# Patient Record
Sex: Female | Born: 1960
Health system: Southern US, Community
[De-identification: ages and names within clinical notes are randomized; demographics above are authoritative.]

## PROBLEM LIST (undated history)

## (undated) DIAGNOSIS — I10 Essential (primary) hypertension: Secondary | ICD-10-CM

## (undated) DIAGNOSIS — E785 Hyperlipidemia, unspecified: Secondary | ICD-10-CM

## (undated) DIAGNOSIS — Z860100 Personal history of colon polyps, unspecified: Secondary | ICD-10-CM

## (undated) DIAGNOSIS — F419 Anxiety disorder, unspecified: Secondary | ICD-10-CM

## (undated) DIAGNOSIS — G5602 Carpal tunnel syndrome, left upper limb: Secondary | ICD-10-CM

## (undated) DIAGNOSIS — R519 Headache, unspecified: Secondary | ICD-10-CM

## (undated) DIAGNOSIS — F32A Depression, unspecified: Secondary | ICD-10-CM

## (undated) DIAGNOSIS — Z8614 Personal history of Methicillin resistant Staphylococcus aureus infection: Secondary | ICD-10-CM

## (undated) DIAGNOSIS — M1990 Unspecified osteoarthritis, unspecified site: Secondary | ICD-10-CM

## (undated) DIAGNOSIS — F329 Major depressive disorder, single episode, unspecified: Secondary | ICD-10-CM

## (undated) HISTORY — DX: Hyperlipidemia, unspecified: E78.5

## (undated) HISTORY — DX: Essential (primary) hypertension: I10

## (undated) HISTORY — DX: Unspecified osteoarthritis, unspecified site: M19.90

## (undated) HISTORY — DX: Depression, unspecified: F32.A

## (undated) HISTORY — PX: BREAST EXCISIONAL BIOPSY: SUR124

## (undated) HISTORY — PX: ABDOMINAL HYSTERECTOMY: SHX81

## (undated) HISTORY — DX: Major depressive disorder, single episode, unspecified: F32.9

## (undated) HISTORY — DX: Headache, unspecified: R51.9

## (undated) HISTORY — DX: Personal history of colon polyps, unspecified: Z86.0100

## (undated) HISTORY — DX: Anxiety disorder, unspecified: F41.9

---

## 1997-09-19 ENCOUNTER — Ambulatory Visit (HOSPITAL_COMMUNITY): Admission: RE | Admit: 1997-09-19 | Discharge: 1997-09-19 | Payer: Self-pay | Admitting: *Deleted

## 1998-08-05 ENCOUNTER — Emergency Department (HOSPITAL_COMMUNITY): Admission: EM | Admit: 1998-08-05 | Discharge: 1998-08-05 | Payer: Self-pay | Admitting: Emergency Medicine

## 1998-10-26 ENCOUNTER — Emergency Department (HOSPITAL_COMMUNITY): Admission: EM | Admit: 1998-10-26 | Discharge: 1998-10-26 | Payer: Self-pay | Admitting: Emergency Medicine

## 1998-12-01 ENCOUNTER — Other Ambulatory Visit: Admission: RE | Admit: 1998-12-01 | Discharge: 1998-12-01 | Payer: Self-pay | Admitting: *Deleted

## 1999-10-29 ENCOUNTER — Ambulatory Visit (HOSPITAL_COMMUNITY): Admission: RE | Admit: 1999-10-29 | Discharge: 1999-10-29 | Payer: Self-pay | Admitting: *Deleted

## 2000-01-20 ENCOUNTER — Other Ambulatory Visit: Admission: RE | Admit: 2000-01-20 | Discharge: 2000-01-20 | Payer: Self-pay | Admitting: *Deleted

## 2000-11-01 ENCOUNTER — Ambulatory Visit (HOSPITAL_COMMUNITY): Admission: RE | Admit: 2000-11-01 | Discharge: 2000-11-01 | Payer: Self-pay | Admitting: Internal Medicine

## 2000-11-01 ENCOUNTER — Encounter: Payer: Self-pay | Admitting: Internal Medicine

## 2001-02-23 ENCOUNTER — Emergency Department (HOSPITAL_COMMUNITY): Admission: EM | Admit: 2001-02-23 | Discharge: 2001-02-23 | Payer: Self-pay | Admitting: Emergency Medicine

## 2001-07-04 ENCOUNTER — Encounter: Payer: Self-pay | Admitting: Emergency Medicine

## 2001-07-04 ENCOUNTER — Emergency Department (HOSPITAL_COMMUNITY): Admission: EM | Admit: 2001-07-04 | Discharge: 2001-07-04 | Payer: Self-pay | Admitting: Emergency Medicine

## 2001-12-04 ENCOUNTER — Ambulatory Visit (HOSPITAL_COMMUNITY): Admission: RE | Admit: 2001-12-04 | Discharge: 2001-12-04 | Payer: Self-pay | Admitting: Internal Medicine

## 2001-12-04 ENCOUNTER — Encounter: Payer: Self-pay | Admitting: Internal Medicine

## 2002-05-07 ENCOUNTER — Ambulatory Visit (HOSPITAL_COMMUNITY): Admission: RE | Admit: 2002-05-07 | Discharge: 2002-05-07 | Payer: Self-pay | Admitting: Internal Medicine

## 2002-05-07 ENCOUNTER — Encounter: Payer: Self-pay | Admitting: Internal Medicine

## 2002-05-10 ENCOUNTER — Encounter: Payer: Self-pay | Admitting: Internal Medicine

## 2002-05-10 ENCOUNTER — Ambulatory Visit (HOSPITAL_COMMUNITY): Admission: RE | Admit: 2002-05-10 | Discharge: 2002-05-10 | Payer: Self-pay | Admitting: Internal Medicine

## 2002-05-17 ENCOUNTER — Other Ambulatory Visit: Admission: RE | Admit: 2002-05-17 | Discharge: 2002-05-17 | Payer: Self-pay | Admitting: Obstetrics and Gynecology

## 2002-05-18 ENCOUNTER — Ambulatory Visit (HOSPITAL_COMMUNITY): Admission: RE | Admit: 2002-05-18 | Discharge: 2002-05-18 | Payer: Self-pay | Admitting: Obstetrics and Gynecology

## 2002-05-18 ENCOUNTER — Encounter (INDEPENDENT_AMBULATORY_CARE_PROVIDER_SITE_OTHER): Payer: Self-pay | Admitting: Specialist

## 2002-05-18 ENCOUNTER — Encounter (INDEPENDENT_AMBULATORY_CARE_PROVIDER_SITE_OTHER): Payer: Self-pay

## 2002-05-18 HISTORY — PX: LAPAROSCOPIC UNILATERAL SALPINGECTOMY: SHX5934

## 2002-05-18 HISTORY — PX: OVARY BIOPSY: SHX2143

## 2002-12-10 ENCOUNTER — Emergency Department (HOSPITAL_COMMUNITY): Admission: EM | Admit: 2002-12-10 | Discharge: 2002-12-10 | Payer: Self-pay | Admitting: Emergency Medicine

## 2003-09-30 ENCOUNTER — Emergency Department (HOSPITAL_COMMUNITY): Admission: EM | Admit: 2003-09-30 | Discharge: 2003-10-01 | Payer: Self-pay | Admitting: Emergency Medicine

## 2003-11-05 ENCOUNTER — Ambulatory Visit (HOSPITAL_COMMUNITY): Admission: RE | Admit: 2003-11-05 | Discharge: 2003-11-05 | Payer: Self-pay | Admitting: Internal Medicine

## 2003-11-11 ENCOUNTER — Other Ambulatory Visit: Admission: RE | Admit: 2003-11-11 | Discharge: 2003-11-11 | Payer: Self-pay | Admitting: Obstetrics and Gynecology

## 2003-11-13 ENCOUNTER — Emergency Department (HOSPITAL_COMMUNITY): Admission: EM | Admit: 2003-11-13 | Discharge: 2003-11-13 | Payer: Self-pay | Admitting: Emergency Medicine

## 2004-04-11 ENCOUNTER — Emergency Department (HOSPITAL_COMMUNITY): Admission: EM | Admit: 2004-04-11 | Discharge: 2004-04-11 | Payer: Self-pay | Admitting: Family Medicine

## 2004-04-15 ENCOUNTER — Emergency Department (HOSPITAL_COMMUNITY): Admission: EM | Admit: 2004-04-15 | Discharge: 2004-04-15 | Payer: Self-pay | Admitting: Family Medicine

## 2004-10-14 ENCOUNTER — Emergency Department (HOSPITAL_COMMUNITY): Admission: EM | Admit: 2004-10-14 | Discharge: 2004-10-14 | Payer: Self-pay | Admitting: Family Medicine

## 2004-11-30 ENCOUNTER — Emergency Department (HOSPITAL_COMMUNITY): Admission: EM | Admit: 2004-11-30 | Discharge: 2004-11-30 | Payer: Self-pay | Admitting: Family Medicine

## 2005-06-03 ENCOUNTER — Emergency Department (HOSPITAL_COMMUNITY): Admission: EM | Admit: 2005-06-03 | Discharge: 2005-06-03 | Payer: Self-pay | Admitting: Family Medicine

## 2005-12-03 ENCOUNTER — Emergency Department (HOSPITAL_COMMUNITY): Admission: EM | Admit: 2005-12-03 | Discharge: 2005-12-03 | Payer: Self-pay | Admitting: Family Medicine

## 2006-08-29 ENCOUNTER — Ambulatory Visit (HOSPITAL_COMMUNITY): Admission: RE | Admit: 2006-08-29 | Discharge: 2006-08-29 | Payer: Self-pay | Admitting: Internal Medicine

## 2006-08-31 ENCOUNTER — Ambulatory Visit (HOSPITAL_COMMUNITY): Admission: RE | Admit: 2006-08-31 | Discharge: 2006-08-31 | Payer: Self-pay | Admitting: Internal Medicine

## 2006-10-24 ENCOUNTER — Ambulatory Visit: Payer: Self-pay

## 2006-11-08 ENCOUNTER — Emergency Department (HOSPITAL_COMMUNITY): Admission: EM | Admit: 2006-11-08 | Discharge: 2006-11-08 | Payer: Self-pay | Admitting: Emergency Medicine

## 2006-12-30 ENCOUNTER — Emergency Department (HOSPITAL_COMMUNITY): Admission: EM | Admit: 2006-12-30 | Discharge: 2006-12-30 | Payer: Self-pay | Admitting: Emergency Medicine

## 2007-01-24 ENCOUNTER — Ambulatory Visit: Payer: Self-pay | Admitting: Gastroenterology

## 2007-01-24 LAB — CONVERTED CEMR LAB: Lipase: 16 units/L (ref 11.0–59.0)

## 2007-01-27 ENCOUNTER — Ambulatory Visit (HOSPITAL_COMMUNITY): Admission: RE | Admit: 2007-01-27 | Discharge: 2007-01-27 | Payer: Self-pay | Admitting: Gastroenterology

## 2007-03-01 ENCOUNTER — Ambulatory Visit: Payer: Self-pay | Admitting: Gastroenterology

## 2007-03-26 ENCOUNTER — Emergency Department (HOSPITAL_COMMUNITY): Admission: EM | Admit: 2007-03-26 | Discharge: 2007-03-26 | Payer: Self-pay | Admitting: Family Medicine

## 2007-04-25 ENCOUNTER — Ambulatory Visit: Payer: Self-pay | Admitting: Gastroenterology

## 2007-04-25 LAB — CONVERTED CEMR LAB
Eosinophils Absolute: 0.1 10*3/uL (ref 0.0–0.6)
Eosinophils Relative: 2.3 % (ref 0.0–5.0)
Hemoglobin: 14 g/dL (ref 12.0–15.0)
Monocytes Absolute: 0.5 10*3/uL (ref 0.2–0.7)
RBC: 4.33 M/uL (ref 3.87–5.11)
RDW: 13.1 % (ref 11.5–14.6)
WBC: 6.5 10*3/uL (ref 4.5–10.5)

## 2007-07-02 ENCOUNTER — Emergency Department (HOSPITAL_COMMUNITY): Admission: EM | Admit: 2007-07-02 | Discharge: 2007-07-02 | Payer: Self-pay | Admitting: Emergency Medicine

## 2007-07-11 ENCOUNTER — Emergency Department (HOSPITAL_COMMUNITY): Admission: EM | Admit: 2007-07-11 | Discharge: 2007-07-11 | Payer: Self-pay | Admitting: Family Medicine

## 2007-07-14 ENCOUNTER — Emergency Department (HOSPITAL_COMMUNITY): Admission: EM | Admit: 2007-07-14 | Discharge: 2007-07-14 | Payer: Self-pay | Admitting: Emergency Medicine

## 2007-07-16 ENCOUNTER — Emergency Department (HOSPITAL_COMMUNITY): Admission: EM | Admit: 2007-07-16 | Discharge: 2007-07-16 | Payer: Self-pay | Admitting: *Deleted

## 2007-09-28 ENCOUNTER — Ambulatory Visit (HOSPITAL_COMMUNITY): Admission: RE | Admit: 2007-09-28 | Discharge: 2007-09-28 | Payer: Self-pay | Admitting: Internal Medicine

## 2008-02-22 ENCOUNTER — Emergency Department (HOSPITAL_COMMUNITY): Admission: EM | Admit: 2008-02-22 | Discharge: 2008-02-22 | Payer: Self-pay | Admitting: Family Medicine

## 2008-03-23 ENCOUNTER — Emergency Department (HOSPITAL_COMMUNITY): Admission: EM | Admit: 2008-03-23 | Discharge: 2008-03-23 | Payer: Self-pay | Admitting: Emergency Medicine

## 2008-04-12 ENCOUNTER — Emergency Department (HOSPITAL_COMMUNITY): Admission: EM | Admit: 2008-04-12 | Discharge: 2008-04-12 | Payer: Self-pay | Admitting: Emergency Medicine

## 2008-05-31 ENCOUNTER — Emergency Department (HOSPITAL_COMMUNITY): Admission: EM | Admit: 2008-05-31 | Discharge: 2008-05-31 | Payer: Self-pay | Admitting: Family Medicine

## 2008-11-26 ENCOUNTER — Emergency Department (HOSPITAL_COMMUNITY): Admission: EM | Admit: 2008-11-26 | Discharge: 2008-11-26 | Payer: Self-pay | Admitting: Family Medicine

## 2009-03-11 ENCOUNTER — Emergency Department (HOSPITAL_COMMUNITY): Admission: EM | Admit: 2009-03-11 | Discharge: 2009-03-11 | Payer: Self-pay | Admitting: Family Medicine

## 2009-05-12 ENCOUNTER — Emergency Department (HOSPITAL_COMMUNITY): Admission: EM | Admit: 2009-05-12 | Discharge: 2009-05-12 | Payer: Self-pay | Admitting: Emergency Medicine

## 2010-01-31 ENCOUNTER — Emergency Department (HOSPITAL_COMMUNITY): Admission: EM | Admit: 2010-01-31 | Discharge: 2010-01-31 | Payer: Self-pay | Admitting: Emergency Medicine

## 2010-11-10 NOTE — Assessment & Plan Note (Signed)
[facility]                         GASTROENTEROLOGY OFFICE NOTE   Rhonda [name], Rhonda [name]                       MRN:          [id number]  DATE:01/24/2007                            DOB:          21-Sep-1960    NEW PATIENT EVALUATION:   HISTORY OF PRESENT ILLNESS:  Rhonda [name] is referred for evaluation of  acid reflux symptoms and also crampy, rather persistent right lower  quadrant pain over the last 3 or 4 months.   Rhonda [name] has been in fairly good health most of her life without  chronic GI complaints, until the last 3 or 4 months, when she has  developed rather classical burning chest pain radiating to her back with  associated spasmodic-type pain in her subxiphoid area.  She has had a  negative upper abdominal ultrasound and has responded partially to  Zegerid administration per Dr. Elisabeth Most.  She also has had almost daily  crampy right lower quadrant pain with abdominal gas and bloating, but no  fever, chills, nausea or vomiting.  She has had some increasing  constipation and has had to use MiraLax.  She has attributed some of her  constipation to new-onset use of Norvasc for hypertension.  Despite all  these complaints, she has gained 10 pounds in weight since May.  She  denies any specific food intolerance or lactose intolerance.  She has  had no hepatobiliary complaints or history of pancreatitis, hepatitis or  other known gastrointestinal problems.  She has not had endoscopic or  barium studies of her gut.  She denies abuse of NSAIDs, alcohol or  cigarette.   PAST MEDICAL HISTORY:  Remarkable for hypertension,  hypercholesterolemia, chronic depression and partial hysterectomy.  She  is under the care of a local gynecologist and has no history of chronic  GYN problems.   MEDICATIONS:  1. Maxalt 10 mg a day.  2. Zegerid 40 mg a day.  3. Micardis HCT 80/25 mg a day.  4. Norvasc 5 mg a day.  5. Zetia 10 mg a day.  6. Claritin 10 mg  p.r.n.  7. Aspirin 81 mg a day.  8. MiraLax daily.  9. Activa daily.   ALLERGIES:  She in the past has had reactions to PENICILLIN.   FAMILY HISTORY:  Noncontributory.   SOCIAL HISTORY:  She is divorced and lives with her children.  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  She does not smoke or abuse ethanol.   REVIEW OF SYSTEMS:  Remarkable for urinary frequency, lower extremity  edema, night sweats, low back pain, severe fatigue, and some shortness  of breath with exertion.  She otherwise denies any specific  cardiovascular, pulmonary, neurologic or systemic complaints.  Review of  systems is otherwise noncontributory.   PHYSICAL EXAMINATION:  She is a healthy-appearing black female appearing  her stated age.  She is 5-feet 5-inches tall and weighs 205 pounds.  Blood pressure is  116/76 and pulse was 80 and regular.  I could not appreciate stigmata of chronic liver disease or thyromegaly.  CHEST:  Clear and she appeared to be in a regular rhythm without  murmurs, gallops or rubs.  I could not appreciate hepatosplenomegaly or abdominal masses, but there  was rather marked tenderness in the right lower quadrant to mild  palpation.  Bowel sounds were somewhat diminished.  The upper extremities were unremarkable and there were negative psoas  and obturator signs in the right leg.  Inspection of the rectum unremarkable, as was rectal exam with solid  stool present that was guaiac-negative.   ASSESSMENT:  1. Acid reflux with associated esophageal spasm.  2. Right lower quadrant pain with marked tenderness certainly      suggestive of inflammatory process such as inflammatory bowel      disease with associated constipation.  3. Well-controlled essential hypertension.  4. History of hypercholesterolemia.  5. History of chronic depression.  6. History of chronic migraine headaches.  7. Status post partial hysterectomy.    RECOMMENDATIONS:  1. I have sent her by the lab to check amylase, lipase, CRP and sed      rate.  2. Pelvic ultrasound exam.  3. Outpatient endoscopy and colonoscopy.  4. Increase Zegerid to 40 mg twice a day, 30 minutes before breakfast      and supper, along with reflux regime.  5. Continue other medication as per Dr. Elisabeth Most.   ADDENDUM:  Dr. Hardie Pulley problem list includes both B12 and folate  deficiency in the past, with the history of chronic anxiety and  depression.  Recent laboratory data from January 05, 2007 shows a normal  CBC and metabolic profile including liver function tests.     Rhonda Rea. Jarold Motto, MD, Caleen Essex, FAGA  Electronically Signed    DRP/MedQ  DD: 01/24/2007  DT: 01/25/2007  Job #: [id number]   cc:   Lovenia Kim, D.O.

## 2010-11-10 NOTE — Letter (Signed)
April 25, 2007    Dr. Lovenia Kim.  [address] [place]  [place], [place] [address]   RE:  Rhonda, [name]  MRN:  [id number]  /  DOB:  01/20/1961   Dear Amy:   I saw Ms. Rhonda [name] back today for followup of her endoscopy and  colonoscopy  performed on March 01, 2007.  She is doing well on daily  Zegerid and denies reflux symptoms.   Her endoscopy was fairly unremarkable, as was her colonoscopy.  It is my  opinion that she had non-erosive gastroesophageal reflux disease and  symptoms consistent with irritable bowel syndrome.  She has been on  Zegerid 40 mg 30 minutes before breakfast, along with daily MiraLax and  is doing well, without complaints.  There has been some history of  questionable pernicious anemia, and I have sent her by the laboratory to  check a CBC and B12 level.   I appreciate the opportunity to work with this most pleasant patient.  I  will be glad to see her in the future if  you think this is needed.    Sincerely,      Vania Rea. Jarold Motto, MD, Caleen Essex, FAGA  Electronically Signed    DRP/MedQ  DD: 04/25/2007  DT: 04/25/2007  Job #: [id number]

## 2010-11-13 NOTE — Assessment & Plan Note (Signed)
[name]                            CARDIOLOGY OFFICE NOTE   Rhonda [name], Rhonda [name]                       MRN:          [id number]  DATE:10/24/2006                            DOB:          1961-04-01    PRIMARY CARE PHYSICIAN:  Marisue Brooklyn, M.D.   CHIEF COMPLAINT:  Hypertension.   HISTORY OF PRESENT ILLNESS:  Rhonda [name] is a 50 year old African-  American lady with a prior history of hypertension, for which she has  recently stopped her medicines because her blood pressure has been  better controlled. She has been having some chest fullness without  exertion or pleuritic or positional nature over the past 3 months. That  prompted Dr. Elisabeth Most to order an exercise test, which was scheduled in  our office today. In preparation for the exercise test, she was noted to  be markedly hypertensive (133/101.) This persisted and the stress test  was cancelled. Our technician was concerned by the marked elevation in  blood pressure. I attempted to contact Dr. Elisabeth Most but was informed  that she was off today. I decided, therefore, to see her myself.   Rhonda [name] is having no chest discomfort, dyspnea, headache, or visual  abnormality at present. She has not had any for several days.   MEDICATIONS:  Zetia only.   ALLERGIES:  PENICILLIN (causes hives.)   PAST MEDICAL HISTORY:  History of pleurisy and pernicious anemia. No B12  shots in a long time.   SOCIAL HISTORY:  She has been separated for 1 year. Her mother-in-law  died this week and she was helping in this, despite her separated  status. This has been very stressful for her. She has also had less  sleep than usual. She has 3 children, who live with her. She has 55 year  old twins and a 74 year old child with cerebral palsy. She denies  tobacco, alcohol, and illicit drug use.   FAMILY HISTORY:  Mother is alive and well at 26. Father died at 84 of  suicide. She has a sister who is alive and  well. Her 2 49 year olds are  alive and well. The 48 year old has cerebral palsy and a seizure  disorder.   REVIEW OF SYSTEMS:  She has an intentional 12 pound weight loss over the  past several months. Review of systems is otherwise negative in detail,  except as above.   PHYSICAL EXAMINATION:  GENERAL:  She is generally well appearing in no  distress.  VITAL SIGNS:  Heart rate 90, blood pressure 150/104 on my repeat check.  NECK:  She has no jugular venous distention, thyromegaly or  lymphadenopathy.  HEENT:  Normal examination.  SKIN:  Normal examination.  MUSCULOSKELETAL:  Examination is normal.  LUNGS:  Respiratory effort is normal. Clear to auscultation.  CARDIOVASCULAR:  She has a non-displaced point of maximal cardiac  impulse. Regular rate and rhythm. Without murmur, rub, or gallop.  ABDOMEN:  Soft, nontender, and nondistended. There is no  hepatosplenomegaly. Bowel sounds are normal.  EXTREMITIES:  Warm without clubbing, cyanosis, or edema. No ulcerations.  NEUROLOGIC:  Normal examination. Alert and oriented times three with  normal affect.   LABORATORY DATA:  Electrocardiogram demonstrates normal sinus rhythm  with minor non-specific STT abnormalities.   IMPRESSION/RECOMMENDATIONS:  Hypertension. Blood pressure is fairly  markedly elevated today. She is asymptomatic. I do not think that it is  prudent to do a stress test until her blood pressure is better  controlled. I therefore, asked her to restart the Micardis at 80 mg,  which she had previously been taking. I provided her with a prescription  for this. I have spoken with Dr. Hardie Pulley office and asked them to  arrange followup for the patient with Dr. Elisabeth Most in the next week or  two. After blood pressure is adequately controlled, I have asked Dr.  Elisabeth Most to re-refer her for stress testing.     Salvadore Farber, MD  Electronically Signed    WED/MedQ  DD: 10/24/2006  DT: 10/24/2006  Job #: [id number]    cc:   Lovenia Kim, D.O.

## 2010-11-13 NOTE — H&P (Signed)
Rhonda [name], [name] NO.:  [id number]   MEDICAL RECORD NO.:  [id number]                   PATIENT TYPE:   LOCATION:                                       FACILITY:   PHYSICIAN:  Rhonda [name], M.D.             DATE OF BIRTH:   DATE OF ADMISSION:  DATE OF DISCHARGE:                                HISTORY & PHYSICAL   HISTORY OF PRESENT ILLNESS:  The patient is a 50 year old [place]  II referred through the courtesy of Rhonda [name], M.D. for evaluation of an  apparent right adnexal mass, coupled with right lower quadrant discomfort.  The patient states that she is status post Cesarean hysterectomy some 11  years ago. She complains of greater than one month history of right lower  quadrant discomfort for which she has been evaluated by Dr. Elisabeth [name]. The  workup has included an ultrasound performed May 07, 2002 of the abdomen  and pelvis which revealed an oval cystic structure of 4.0 cm in greatest  diameter in the right adnexal area. It appeared separate and apart from the  ovary per say. It was felt to possibly represent a hydrosalpinx. The  differential also included peritoneal occlusion cyst or  peritubal/paraovarian cyst. The patient also had CT scan of the abdomen and  pelvis at [facility] dated May 10, 2002 which revealed similar  images, specifically a 3.5 cm cystic structure posterior to the right ovary  also appearing independent from the ovary. The patient presents for  diagnostic/operative laparoscopy pursuant to these findings.   MEDICAL DECISION MAKING:  1. Celexa 20 mg daily.  2. HCTZ 20 mg daily.   PAST MEDICAL HISTORY:  1. Hypertension.  2. Depression.  3. Irritable bowel syndrome.  4. Osteoarthritis.  5. Hyperlipidemia.   PAST SURGICAL HISTORY:  Cesarean hysterectomy approximately 11 years ago.   ALLERGIES:  None.   FAMILY HISTORY:  Positive for hypertension and diabetes mellitus.   SOCIAL  HISTORY:  The patient denies the use of tobacco or significant  alcohol.   REVIEW OF SYSTEMS:  Noncontributory.   PHYSICAL EXAMINATION:  GENERAL: A well developed, well nourished, pleasant  female in no acute distress.  VITAL SIGNS: Afebrile. Stable.  SKIN: Warm and dry without lesions.  LYMPH: No supraclavicular, cervical, or inguinal adenopathy.  HEENT: Normocephalic.  NECK:  Within normal limits.  CHEST: Clear.  LUNGS: Clear.  CARDIAC: Regular rate and rhythm. No murmur, rub, or gallop.  BREAST: Examination is deferred.  ABDOMEN: Soft and nontender without mass or organomegaly. Bowel sounds are  active.  MS: Full range of motion without edema, cyanosis, or cardiovascular  tenderness.  PELVIC: Examination is deferred.  EXTREMITIES: Within normal limits.  GU: Vagina without gross lesions. Vaginal cuff is visualized with some  difficulty due to patient discomfort. PAP is done. Cervix not visualized.  Bimanual examination reveals surgical absence of the uterus. I  can  appreciate no left adnexal mass. In the right adnexa, there is a fullness  present but no discrete mass that can be circumscribed or measured.   IMPRESSION:  1. Right adnexal mass demonstrated on ultrasound and CT scan. Differential     includes hydrosalpinx versus peritubal/paraovarian cyst versus peritoneal     occlusion cyst.  2. Right lower quadrant discomfort probably secondary to right adnexal mass.     Differential includes endometriosis, adhesions, primary GI abnormality,     EG irritable bowel syndrome, etc.  3. Status post Cesarean hysterectomy.  4. Hypertension.  5. Depression.  6. Irritable bowel syndrome.  7. Osteoarthritis.  8. Hyperlipidemia.   PLAN:  Diagnostic/operative laparoscopy with removal of right adnexal mass.  Risks, benefits, complications, and alternatives were fully discussed with  the patient. She understands the possibility of unilateral or bilateral  salpingo-oophorectomy and  agrees to same. She understands the possibility of  an exploratory laparotomy and agrees to same. Questions invited and  answered.                                                 Rhonda [name], M.D.    JAM/MEDQ  D:  05/17/2002  T:  05/18/2002  Job:  [id number]   cc:   Rhonda [name], D.O.

## 2010-11-13 NOTE — Op Note (Signed)
NAME:  Rhonda [name], Rhonda [name]                          ACCOUNT NO.:  [id number]   MEDICAL RECORD NO.:  [id number]                   PATIENT TYPE:  AMB   LOCATION:  [facility]                                  FACILITY:  [facility]   PHYSICIAN:  James A. Ashley Royalty, M.D.             DATE OF BIRTH:  03-Jul-1960   DATE OF PROCEDURE:  05/18/2002  DATE OF DISCHARGE:                                 OPERATIVE REPORT   PREOPERATIVE DIAGNOSES:  1. Right adnexal cyst.  2. Pelvic pain, principally right lower quadrant.  3. Status post cesarean hysterectomy.   POSTOPERATIVE DIAGNOSES:  1. Right peritubal cyst.  2. Left peritubal cyst.  3. Pelvic adhesions.  4. Perihepatic adhesions.  5. Pigmented lesion of the right ovary, rule out endometriosis.   PROCEDURE:  1. Diagnostic/operative laparoscopy.  2. Removal of right and left peritubal cysts.  3. Right salpingectomy.  4. Right ovarian biopsy.  5. Lysis of adhesions.   SURGEON:  Rudy Jew. Ashley Royalty, M.D.   ANESTHESIA:  General endotracheal.   ESTIMATED BLOOD LOSS:  Less than 25 cc.   COMPLICATIONS:  None.   PACKS AND DRAINS:  None.   PROCEDURE:  The patient was taken to the operating room, placed in the  dorsal supine position where after general endotracheal anesthesia was  administered she was placed in the lithotomy position and prepped and draped  in the usual manner for abdominal and vaginal surgery.  Bladder was drained  with a red rubber catheter.  After instilling 0.25% Marcaine in the inferior  aspect of the umbilicus, a 1.2 cm infraumbilical incision was made in the  longitudinal plane.  Next, the size 10-11 disposable laparoscopic trocar was  placed into the abdominal cavity, its location verified by placement of the  laparoscope.  There was no evidence of any trauma.  Pneumoperitoneum was  created with CO2 and maintained throughout the procedure.  Next, two 5 mm  suprapubic trocars were placed in the left and right lower quadrants,  respectively.  Transillumination and direct visualization techniques were  employed.  At this point the pelvis was thoroughly inspected.  The uterus  was noted to be surgically absent.  Inspection of the left adnexa revealed  the tubal remnant after cesarean hysterectomy.  The left ovary was  approximately 2 x 2 x 3 cm with two 7 mm cystic follicles attached to it.  There was an approximately 1 cm peritubal cyst noted on the left side.  In  the right adnexa the tubal remnant was noted.  Next to the tubal remnant was  an approximately 5 cm peritubal cyst.  The ovary itself was approximately 2  x 2 x 3 cm.  On the posterior aspect of the ovary there was a 2 mm pigmented  lesion consistent with, but not diagnostic of, endometriosis.  Also,  significantly, there were adhesions from the large bowel to the right adnexa  filmy in  nature.  Looking elsewhere in the peritoneal cavity there were  adhesions from the small bowel to the anterior abdominal wall to the right  of the umbilicus and to the right abdominal side wall.  Remainder of the  peritoneal surfaces were smooth and glistening.  Looking cephalad at the  liver there were numerous perihepatic lesions noted.  The falciform ligament  was noted.  Appropriate photos were obtained.  Attention was then turned to  the operative laparoscopy.  Using the tripolar cautery the adhesions were  lysed from the small bowel to the anterior abdominal wall on the right  aspect of the pelvis near the level of the umbilicus.  The adhesions were  lysed from the large bowel to the right adnexa.  These were noted to be  filmy.  Hemostasis was noted throughout.  Next, the tripolar was used to  perform a right salpingectomy and isolate and remove the right peritubal  cyst.  Peritoneal washings were obtained prior to manipulation of the cyst.  Once the right fallopian tube and peritubal cyst were removed from their  attachments the cyst was aspirated in order to more  easily pass through the  superior trocar.  The specimen was easily delivered through the superior  trocar and submitted to pathology for histologic studies.  A representative  sample of the adhesion was also sent for histologic study.  Next, the small  left peritubal cyst was isolated, coagulated, excised, and submitted to  pathology for histologic studies.   Finally, a biopsy was obtained from the pigmented area of the right ovary.  The operator had a low index of suspicion for endometriosis.  Hemostasis was  obtained with the bipolar cautery.  Copious irrigation was accomplished.  Hemostasis was noted.  At this point the patient was felt to have benefitted  maximally from the procedure. The abdominal instruments were then removed  and pneumoperitoneum evacuated.  Fascial defects were closed with 0 Vicryl  superiorly and 2-0 chromic inferiorly.  The skin was closed with 3-0 chromic  on a subcuticular fashion.  Approximately 10 cc of 0.25% Marcaine were  instilled into the incisions to aid in postoperative analgesia.  The patient  tolerated procedure extremely well and was returned to the recovery room in  good condition.                                               James A. Ashley Royalty, M.D.    JAM/MEDQ  D:  05/18/2002  T:  05/18/2002  Job:  [id number]   cc:   Lovenia Kim, D.O.

## 2011-01-20 ENCOUNTER — Ambulatory Visit (INDEPENDENT_AMBULATORY_CARE_PROVIDER_SITE_OTHER): Payer: Self-pay

## 2011-01-20 ENCOUNTER — Inpatient Hospital Stay (INDEPENDENT_AMBULATORY_CARE_PROVIDER_SITE_OTHER)
Admission: RE | Admit: 2011-01-20 | Discharge: 2011-01-20 | Disposition: A | Discharge status: Home / Self Care | Payer: Self-pay | Source: Ambulatory Visit | Attending: Emergency Medicine | Admitting: Emergency Medicine

## 2011-01-20 DIAGNOSIS — M259 Joint disorder, unspecified: Secondary | ICD-10-CM

## 2011-01-20 DIAGNOSIS — B354 Tinea corporis: Secondary | ICD-10-CM

## 2011-03-18 LAB — CBC
HCT: 39.1
Hemoglobin: 13.4
MCHC: 34.4
MCV: 91.1
Platelets: 244
RBC: 4.29
RDW: 13.6
WBC: 9.8

## 2011-03-18 LAB — DIFFERENTIAL
Basophils Absolute: 0
Basophils Relative: 1
Eosinophils Absolute: 0.1
Eosinophils Relative: 1
Lymphocytes Relative: 12
Lymphs Abs: 1.1
Monocytes Absolute: 0.6
Monocytes Relative: 6
Neutro Abs: 8 — ABNORMAL HIGH
Neutrophils Relative %: 82 — ABNORMAL HIGH

## 2011-03-18 LAB — POCT I-STAT CREATININE
Creatinine, Ser: 1.1
Operator id: 198171

## 2011-03-18 LAB — I-STAT 8, (EC8 V) (CONVERTED LAB)
BUN: 11
Bicarbonate: 23
Chloride: 106
Glucose, Bld: 93
Potassium: 4.6
TCO2: 24
pH, Ven: 7.31 — ABNORMAL HIGH

## 2011-03-29 LAB — URINALYSIS, ROUTINE W REFLEX MICROSCOPIC
Hgb urine dipstick: NEGATIVE
Nitrite: NEGATIVE
Protein, ur: NEGATIVE
Specific Gravity, Urine: 1.025
Urobilinogen, UA: 1
pH: 6.5

## 2012-09-22 ENCOUNTER — Encounter: Payer: Self-pay | Admitting: Physician Assistant

## 2012-09-22 ENCOUNTER — Ambulatory Visit (INDEPENDENT_AMBULATORY_CARE_PROVIDER_SITE_OTHER): Payer: BC Managed Care – PPO | Admitting: Family Medicine

## 2012-09-22 VITALS — BP 124/84 | HR 68 | Temp 98.3°F | Resp 18 | Ht 63.5 in | Wt 197.0 lb

## 2012-09-22 DIAGNOSIS — B3731 Acute candidiasis of vulva and vagina: Secondary | ICD-10-CM

## 2012-09-22 DIAGNOSIS — L293 Anogenital pruritus, unspecified: Secondary | ICD-10-CM

## 2012-09-22 DIAGNOSIS — N898 Other specified noninflammatory disorders of vagina: Secondary | ICD-10-CM

## 2012-09-22 DIAGNOSIS — M25522 Pain in left elbow: Secondary | ICD-10-CM

## 2012-09-22 DIAGNOSIS — M25529 Pain in unspecified elbow: Secondary | ICD-10-CM

## 2012-09-22 DIAGNOSIS — B373 Candidiasis of vulva and vagina: Secondary | ICD-10-CM

## 2012-09-22 DIAGNOSIS — M25562 Pain in left knee: Secondary | ICD-10-CM

## 2012-09-22 DIAGNOSIS — M25569 Pain in unspecified knee: Secondary | ICD-10-CM

## 2012-09-22 LAB — WET PREP FOR TRICH, YEAST, CLUE: Trich, Wet Prep: NONE SEEN

## 2012-09-22 MED ORDER — FLUCONAZOLE 150 MG PO TABS: 150.0000 mg | ORAL_TABLET | Freq: Every day | ORAL | Status: DC

## 2012-09-22 NOTE — Progress Notes (Signed)
Patient ID: Rhonda [name] MRN: [id number], DOB: [birth year], 52 y.o. Date of Encounter: @DATE @  Chief Complaint:  Chief Complaint  Patient presents with  . severe peri area irritation    and shooting pains going up l    HPI: 52 y.o. year old female  presents with :  1-Vaginal itching. Feels very irritated. Has seen no discharge. Used replense with no benefit.  2- Left Knee Pain: Was seen here 9/13. Dr Tanya Nones felt she probably had meniscal tear. However, at that time she had no insurance so she elected to treat with prednisone. Says this helped a lot but now all symptoms have returned. Now has insurance so wants to f/u with ortho now.  Also having pain in left elbow. Pain at olecranon area. Also fingers feel "asleep" when bends elbow.   History reviewed. No pertinent past medical history.   Home Meds: No current outpatient prescriptions on file prior to visit.   No current facility-administered medications on file prior to visit.    Allergies:  Allergies  Allergen Reactions  . Lipitor (Atorvastatin)     myalgias  . Lisinopril Cough  . Penicillins Rash    History   Social History  . Marital Status: Divorced    Spouse Name: N/A    Number of Children: N/A  . Years of Education: N/A   Occupational History  . Not on file.   Social History Main Topics  . Smoking status: Never Smoker   . Smokeless tobacco: Never Used  . Alcohol Use: No  . Drug Use: No  . Sexually Active: Not on file   Other Topics Concern  . Not on file   Social History Narrative  . No narrative on file    No family history on file.   Review of Systems: Constitutional: negative for chills, fever, night sweats, weight changes, or fatigue  HEENT: negative for vision changes, hearing loss, congestion, rhinorrhea, ST, epistaxis, or sinus pressure Cardiovascular: negative for chest pain or palpitations. No new/increased shortness of breath or dyspnea on exertion Respiratory: negative for  hemoptysis, wheezing, shortness of breath, or cough Abdominal: negative for abdominal pain, nausea, vomiting, diarrhea, or constipation Dermatological: negative for rash or concerning skin lesions Neurologic: negative for headache, dizziness, or syncope All other systems reviewed and are otherwise negative with the exception to those above and in the HPI.   Physical Exam: Blood pressure 124/84, pulse 68, temperature 98.3 F (36.8 C), temperature source Oral, resp. rate 18, height 5' 3.5" (1.613 m), weight 197 lb (89.359 kg)., Body mass index is 34.35 kg/(m^2). General: Obese female, in no acute distress. Neck: Supple. No thyromegaly. Full ROM. No lymphadenopathy. Lungs: Clear bilaterally to auscultation without wheezes, rales, or rhonchi. Breathing is unlabored. Heart: RRR with S1 S2. No murmurs, rubs, or gallops. Abdomen: Soft, non-tender, non-distended with normoactive bowel sounds. No hepatomegaly. No rebound/guarding. No obvious abdominal masses. Pelvic Exam: Large amount of clumpy/pasty white discharge present. Mild erythema of vulva and vaginal mucosa. Remainder of pelvic exam nml. Musculoskeletal:  Strength and tone normal for age. Left knee with positive McMurry.Negative Ant/Post drawer. No laxity with valgus/varus stress. Left Elbow: Pain with palpation of lateral epicondylitis. Also pain with palpation in groove next to olecranon. Extremities/Skin: Warm and dry. No clubbing or cyanosis. No edema. No rashes or suspicious lesions. Neuro: Alert and oriented X 3. Moves all extremities spontaneously. Gait is normal. CNII-XII grossly in tact. Psych:  Responds to questions appropriately with a normal affect.  ASSESSMENT AND PLAN:  52 y.o. year old female with  1. Vaginal yeast infection - fluconazole (DIFLUCAN) 150 MG tablet; Take 1 tablet (150 mg total) by mouth daily.  Dispense: 2 tablet; Refill: 0  2. Vaginal itching - WET PREP FOR TRICH, YEAST, CLUE - fluconazole (DIFLUCAN)  150 MG tablet; Take 1 tablet (150 mg total) by mouth daily.  Dispense: 2 tablet; Refill: 0  3. Left elbow pain - Ambulatory referral to Orthopedic Surgery  4. Left knee pain - Ambulatory referral to Orthopedic Surgery  Her son has seen [facility]. She requests referral to them for f/u of knee and elbow.  [address] [place], [place], [facility] 09/22/2012 4:30 PM

## 2012-10-24 ENCOUNTER — Other Ambulatory Visit: Payer: Self-pay | Admitting: Orthopedic Surgery

## 2012-10-24 ENCOUNTER — Other Ambulatory Visit: Payer: Self-pay

## 2012-10-24 DIAGNOSIS — M542 Cervicalgia: Secondary | ICD-10-CM

## 2012-10-28 ENCOUNTER — Ambulatory Visit
Admission: RE | Admit: 2012-10-28 | Discharge: 2012-10-28 | Disposition: A | Discharge status: Home / Self Care | Payer: BC Managed Care – PPO | Source: Ambulatory Visit | Attending: Orthopedic Surgery | Admitting: Orthopedic Surgery

## 2012-10-28 DIAGNOSIS — M542 Cervicalgia: Secondary | ICD-10-CM

## 2012-11-16 ENCOUNTER — Telehealth: Payer: Self-pay | Admitting: Family Medicine

## 2012-11-16 MED ORDER — HYDROCHLOROTHIAZIDE 25 MG PO TABS: 25.0000 mg | ORAL_TABLET | Freq: Every day | ORAL | Status: DC

## 2012-11-16 MED ORDER — ATENOLOL 25 MG PO TABS: 25.0000 mg | ORAL_TABLET | Freq: Every day | ORAL | Status: DC

## 2012-11-16 NOTE — Telephone Encounter (Signed)
Med rf °

## 2012-12-13 ENCOUNTER — Encounter: Payer: Self-pay | Admitting: Family Medicine

## 2012-12-13 ENCOUNTER — Ambulatory Visit (INDEPENDENT_AMBULATORY_CARE_PROVIDER_SITE_OTHER): Payer: BC Managed Care – PPO | Admitting: Family Medicine

## 2012-12-13 VITALS — BP 130/90 | HR 78 | Temp 97.3°F | Resp 16 | Ht 63.5 in | Wt 192.0 lb

## 2012-12-13 DIAGNOSIS — N952 Postmenopausal atrophic vaginitis: Secondary | ICD-10-CM

## 2012-12-13 DIAGNOSIS — I10 Essential (primary) hypertension: Secondary | ICD-10-CM

## 2012-12-13 DIAGNOSIS — N951 Menopausal and female climacteric states: Secondary | ICD-10-CM

## 2012-12-13 DIAGNOSIS — M25512 Pain in left shoulder: Secondary | ICD-10-CM

## 2012-12-13 DIAGNOSIS — M25519 Pain in unspecified shoulder: Secondary | ICD-10-CM

## 2012-12-13 MED ORDER — ESTRADIOL 0.1 MG/GM VA CREA: TOPICAL_CREAM | VAGINAL | Status: DC

## 2012-12-13 NOTE — Patient Instructions (Addendum)
Take the Estroven vitamin for the hot flashes Check blood pressure check each morning Naprosyn twice a day with for the shoulder Topical estrogen - take as directed, daily for 2 weeks then twice a week thereafter Call for any concerns F/U Shon Hale as previous

## 2012-12-14 ENCOUNTER — Encounter: Payer: Self-pay | Admitting: Family Medicine

## 2012-12-14 DIAGNOSIS — I10 Essential (primary) hypertension: Secondary | ICD-10-CM | POA: Insufficient documentation

## 2012-12-14 DIAGNOSIS — N952 Postmenopausal atrophic vaginitis: Secondary | ICD-10-CM | POA: Insufficient documentation

## 2012-12-14 DIAGNOSIS — N951 Menopausal and female climacteric states: Secondary | ICD-10-CM | POA: Insufficient documentation

## 2012-12-14 DIAGNOSIS — M25519 Pain in unspecified shoulder: Secondary | ICD-10-CM | POA: Insufficient documentation

## 2012-12-14 LAB — BASIC METABOLIC PANEL
CO2: 29 mEq/L (ref 19–32)
Calcium: 9 mg/dL (ref 8.4–10.5)
Glucose, Bld: 81 mg/dL (ref 70–99)
Potassium: 3.8 mEq/L (ref 3.5–5.3)
Sodium: 141 mEq/L (ref 135–145)

## 2012-12-14 LAB — CBC
HCT: 39.7 % (ref 36.0–46.0)
Hemoglobin: 13.8 g/dL (ref 12.0–15.0)
MCHC: 34.8 g/dL (ref 30.0–36.0)
MCV: 87.4 fL (ref 78.0–100.0)
RDW: 15.2 % (ref 11.5–15.5)
WBC: 5.8 10*3/uL (ref 4.0–10.5)

## 2012-12-14 NOTE — Assessment & Plan Note (Signed)
Try Estroven vitamin with black co-hosh

## 2012-12-14 NOTE — Assessment & Plan Note (Signed)
Defer to ortho, currently in treatment Advised to stop all other OTC meds Take only the prescribed Naprosyn BID

## 2012-12-14 NOTE — Assessment & Plan Note (Signed)
Repeat BP today wnl, I reviewed other BP from paper chart, no difficulties with BP, this has all occurred with treatment for her shoulder which is causing a lot of pain and discomfort BMET,CBC checked as overdue She will monitor BP at home, and call, if needed will titrate atenolol, will hold today, if she takes meds for shoulder this will probably return to baseline

## 2012-12-14 NOTE — Progress Notes (Signed)
  Subjective:    Patient ID: Rhonda [name], female    DOB: 1961/01/30, 52 y.o.   MRN: [id number]  HPI  Pt here to f/u BP and request for estrace cream. She has been having difficulty with OA and nerve damage in Left shoulder and arm ( MRI/injections). She has been afraid to take medications, given script for naprosyn she thought it would make her sleepy, instead took celebrex samples, ibuprofen and aleve. She gets pain and numbness in left shoulder worse at night, she had BP taken in ortho office was elevated to 150/90's, she took BP at home 154/96 was in a lot of pain, took bp in middle of night 150/97. Taking BP meds at bedtime.  Discussed with her PCP at last visit using estrogen cream for her vaginal dryness and atrophy, wants to go ahead and start medication to help symptoms. Has some hot flashes as well.   Review of Systems - per above  GEN- denies fatigue, fever, weight loss,weakness, recent illness CVS- denies chest pain, palpitations RESP- denies SOB, cough, wheeze MSK- + joint pain, muscle aches, injury Neuro- denies headache, dizziness, syncope, seizure activity      Objective:   Physical Exam GEN- NAD, alert and oriented x3 HEENT- PERRL, EOMI, non injected sclera, pink conjunctiva, MMM, oropharynx clear CVS-RRR, no murmur RESP-CTAB MSK- Left shoulder no swelling, pain with ROM,  Neuro- Sensation grossly in tact UE, normal tone, strength equal bilat UE EXT- No edema Pulses- Radial 2+        Assessment & Plan:

## 2012-12-14 NOTE — Assessment & Plan Note (Signed)
Post menopausal changes, discussed oral vs topical, due to potentional side effects will use topical estrogen Advised will not help with the vasomotor symptoms.

## 2013-03-19 ENCOUNTER — Other Ambulatory Visit: Payer: Self-pay | Admitting: Family Medicine

## 2013-03-19 NOTE — Telephone Encounter (Signed)
Medication refilled per protocol. 

## 2013-09-04 ENCOUNTER — Other Ambulatory Visit: Payer: Self-pay | Admitting: *Deleted

## 2013-09-04 MED ORDER — HYDROCHLOROTHIAZIDE 25 MG PO TABS: ORAL_TABLET | ORAL | Status: DC

## 2013-09-04 MED ORDER — ATENOLOL 25 MG PO TABS: ORAL_TABLET | ORAL | Status: DC

## 2013-09-04 NOTE — Telephone Encounter (Signed)
Medication filled x1 with no refills.   Requires office visit before any further refills can be given.  

## 2013-10-25 ENCOUNTER — Ambulatory Visit (INDEPENDENT_AMBULATORY_CARE_PROVIDER_SITE_OTHER): Payer: BC Managed Care – PPO | Admitting: Podiatry

## 2013-10-25 ENCOUNTER — Encounter: Payer: Self-pay | Admitting: Podiatry

## 2013-10-25 ENCOUNTER — Ambulatory Visit (INDEPENDENT_AMBULATORY_CARE_PROVIDER_SITE_OTHER): Payer: BC Managed Care – PPO

## 2013-10-25 DIAGNOSIS — M779 Enthesopathy, unspecified: Secondary | ICD-10-CM

## 2013-10-25 MED ORDER — TRIAMCINOLONE ACETONIDE 10 MG/ML IJ SUSP
10.0000 mg | Freq: Once | INTRAMUSCULAR | Status: AC
  Administered 2013-10-25: 10 mg

## 2013-10-25 NOTE — Progress Notes (Signed)
   Subjective:    Patient ID: Rhonda [name], female    DOB: 03/08/1961, 53 y.o.   MRN: [id number]  HPI Comments: "I had surgery about 8-9 years here and now my foot is hurting again"  Patient c/o aching forefoot right for about 6 months. She has had previous foot surgery lateral side (5th MPJ?) right and fell afterwards and bent the pins and had to use a bone stimulator to heal site. The area of pain now is swollen. She has been taking diclofenac or Aleve, which she takes for her shoulder. Not helping.  Foot Pain Associated symptoms include congestion, coughing, joint swelling, myalgias, a sore throat and weakness. Pertinent negatives include no abdominal pain, arthralgias, chest pain, chills, diaphoresis, fatigue, fever, headaches, nausea, neck pain, numbness, rash or vomiting.      Review of Systems  Constitutional: Negative for fever, chills, diaphoresis, activity change, appetite change, fatigue and unexpected weight change.  HENT: Positive for congestion and sore throat. Negative for dental problem, drooling, ear discharge, ear pain, facial swelling, hearing loss, mouth sores, nosebleeds, postnasal drip, rhinorrhea, sinus pressure, sneezing, tinnitus, trouble swallowing and voice change.   Eyes: Positive for itching. Negative for photophobia, pain, discharge, redness and visual disturbance.  Respiratory: Positive for cough. Negative for apnea, choking, chest tightness, shortness of breath, wheezing and stridor.   Cardiovascular: Negative for chest pain, palpitations and leg swelling.  Gastrointestinal: Negative for nausea, vomiting, abdominal pain, diarrhea, constipation, blood in stool, abdominal distention, anal bleeding and rectal pain.  Endocrine: Negative for cold intolerance, heat intolerance, polydipsia, polyphagia and polyuria.  Genitourinary: Negative for dysuria, urgency, frequency, hematuria, flank pain, decreased urine volume, vaginal bleeding, vaginal discharge, enuresis,  difficulty urinating, genital sores, menstrual problem, pelvic pain and dyspareunia.  Musculoskeletal: Positive for joint swelling and myalgias. Negative for arthralgias, back pain, gait problem, neck pain and neck stiffness.  Skin: Negative for color change, pallor, rash and wound.  Allergic/Immunologic: Negative for environmental allergies and food allergies.  Neurological: Positive for weakness. Negative for dizziness, tremors, seizures, syncope, facial asymmetry, speech difficulty, light-headedness, numbness and headaches.  Hematological: Negative for adenopathy. Bruises/bleeds easily.  All other systems reviewed and are negative.      Objective:   Physical Exam        Assessment & Plan:

## 2013-10-26 NOTE — Progress Notes (Signed)
Subjective:     Patient ID: Rhonda [name], female   DOB: 03/22/1961, 53 y.o.   MRN: [id number]  Foot Pain   patient points to the right foot states that she's having a lot of pain in her forefoot for a while and that it makes it hard for her to walk extensive periods of time. Does not remember significant injury  Review of Systems  All other systems reviewed and are negative.      Objective:   Physical Exam  Nursing note and vitals reviewed. Constitutional: She is oriented to person, place, and time.  Cardiovascular: Intact distal pulses.   Musculoskeletal: Normal range of motion.  Neurological: She is oriented to person, place, and time.  Skin: Skin is dry.   neurovascular status intact with muscle strength adequate of the tensor and flexor tendon groups. Well-healed surgical site fifth metatarsal right and digits found to be well-perfused with well-developed arch. I noted there to be discomfort with inflammation and pain around the second metatarsophalangeal joint right foot     Assessment:     Capsulitis right second MPJ with inflammation and fluid around the joint    Plan:     H&P and x-ray reviewed today careful injection administered after proximal nerve block. Explaining chances for rupture associated with this and she is willing to accept the risk of the procedure and I did inject with a quarter cc of dexamethasone Kenalog and applied thick plantar pad. Reappoint her recheck

## 2013-11-01 ENCOUNTER — Encounter: Payer: Self-pay | Admitting: Podiatry

## 2013-11-01 ENCOUNTER — Ambulatory Visit (INDEPENDENT_AMBULATORY_CARE_PROVIDER_SITE_OTHER): Payer: BC Managed Care – PPO | Admitting: Podiatry

## 2013-11-01 VITALS — BP 140/92 | HR 91 | Resp 16

## 2013-11-01 DIAGNOSIS — M779 Enthesopathy, unspecified: Secondary | ICD-10-CM

## 2013-11-02 NOTE — Progress Notes (Signed)
Subjective:     Patient ID: Rhonda [name], female   DOB: 10/14/1960, 53 y.o.   MRN: [id number]  HPI patient presents stating that the joint is feeling better but if she's active on her foot she still is getting discomfort   Review of Systems     Objective:   Physical Exam Neurovascular status intact with capsule of the second MPJ right still inflamed upon deep palpation with mild hammertoe deformity    Assessment:     Improved capsulitis right second MPJ was structural changes around the joint leading to continued discomfort    Plan:     Reviewed condition and recommended long-term orthotics to try to reduce stress against the joint. Patient is scanned for custom orthotics at this time and metatarsal pads are dispensed into orthotics are returned

## 2013-11-23 ENCOUNTER — Ambulatory Visit (INDEPENDENT_AMBULATORY_CARE_PROVIDER_SITE_OTHER): Payer: BC Managed Care – PPO | Admitting: *Deleted

## 2013-11-23 DIAGNOSIS — M779 Enthesopathy, unspecified: Secondary | ICD-10-CM

## 2013-11-23 NOTE — Patient Instructions (Signed)

## 2013-11-23 NOTE — Progress Notes (Signed)
   Subjective:    Patient ID: Rhonda [name], female    DOB: 1960-12-20, 53 y.o.   MRN: [id number]  HPI PICK UP ORTHOTICS AND GIVEN INSTRUCTION.   Review of Systems     Objective:   Physical Exam        Assessment & Plan:

## 2013-12-19 ENCOUNTER — Encounter: Payer: Self-pay | Admitting: Family Medicine

## 2013-12-19 ENCOUNTER — Encounter: Payer: Self-pay | Admitting: *Deleted

## 2013-12-19 ENCOUNTER — Telehealth: Payer: Self-pay | Admitting: *Deleted

## 2013-12-19 ENCOUNTER — Ambulatory Visit (INDEPENDENT_AMBULATORY_CARE_PROVIDER_SITE_OTHER): Payer: BC Managed Care – PPO | Admitting: Family Medicine

## 2013-12-19 VITALS — BP 132/78 | HR 70 | Temp 97.7°F | Resp 16 | Ht 64.0 in | Wt 199.0 lb

## 2013-12-19 DIAGNOSIS — Z1211 Encounter for screening for malignant neoplasm of colon: Secondary | ICD-10-CM

## 2013-12-19 DIAGNOSIS — Z130 Encounter for screening for diseases of the blood and blood-forming organs and certain disorders involving the immune mechanism: Secondary | ICD-10-CM

## 2013-12-19 DIAGNOSIS — Z1231 Encounter for screening mammogram for malignant neoplasm of breast: Secondary | ICD-10-CM

## 2013-12-19 DIAGNOSIS — N951 Menopausal and female climacteric states: Secondary | ICD-10-CM

## 2013-12-19 DIAGNOSIS — N952 Postmenopausal atrophic vaginitis: Secondary | ICD-10-CM

## 2013-12-19 DIAGNOSIS — Z1329 Encounter for screening for other suspected endocrine disorder: Secondary | ICD-10-CM

## 2013-12-19 DIAGNOSIS — R238 Other skin changes: Secondary | ICD-10-CM

## 2013-12-19 DIAGNOSIS — Z111 Encounter for screening for respiratory tuberculosis: Secondary | ICD-10-CM

## 2013-12-19 DIAGNOSIS — I10 Essential (primary) hypertension: Secondary | ICD-10-CM

## 2013-12-19 DIAGNOSIS — Z1321 Encounter for screening for nutritional disorder: Secondary | ICD-10-CM

## 2013-12-19 DIAGNOSIS — M159 Polyosteoarthritis, unspecified: Secondary | ICD-10-CM | POA: Insufficient documentation

## 2013-12-19 DIAGNOSIS — E669 Obesity, unspecified: Secondary | ICD-10-CM

## 2013-12-19 DIAGNOSIS — L988 Other specified disorders of the skin and subcutaneous tissue: Secondary | ICD-10-CM

## 2013-12-19 DIAGNOSIS — Z13228 Encounter for screening for other metabolic disorders: Secondary | ICD-10-CM

## 2013-12-19 LAB — LIPID PANEL
Cholesterol: 252 mg/dL — ABNORMAL HIGH (ref 0–200)
HDL: 70 mg/dL (ref >39)
LDL CALC: 170 mg/dL — AB (ref 0–99)
Total CHOL/HDL Ratio: 3.6 Ratio
Triglycerides: 60 mg/dL (ref <150)
VLDL: 12 mg/dL (ref 0–40)

## 2013-12-19 LAB — CBC WITH DIFFERENTIAL/PLATELET
Basophils Absolute: 0 10*3/uL (ref 0.0–0.1)
Basophils Relative: 0 % (ref 0–1)
EOS ABS: 0.1 10*3/uL (ref 0.0–0.7)
Eosinophils Relative: 1 % (ref 0–5)
HCT: 39.6 % (ref 36.0–46.0)
Hemoglobin: 13.8 g/dL (ref 12.0–15.0)
Lymphocytes Relative: 42 % (ref 12–46)
Lymphs Abs: 2.1 10*3/uL (ref 0.7–4.0)
MCH: 30.1 pg (ref 26.0–34.0)
MCHC: 34.8 g/dL (ref 30.0–36.0)
MCV: 86.5 fL (ref 78.0–100.0)
Monocytes Absolute: 0.5 10*3/uL (ref 0.1–1.0)
Monocytes Relative: 9 % (ref 3–12)
Neutro Abs: 2.4 10*3/uL (ref 1.7–7.7)
Neutrophils Relative %: 48 % (ref 43–77)
PLATELETS: 243 10*3/uL (ref 150–400)
RBC: 4.58 MIL/uL (ref 3.87–5.11)
RDW: 14.3 % (ref 11.5–15.5)
WBC: 5 10*3/uL (ref 4.0–10.5)

## 2013-12-19 LAB — COMPREHENSIVE METABOLIC PANEL
ALT: 10 U/L (ref 0–35)
AST: 15 U/L (ref 0–37)
Albumin: 4.3 g/dL (ref 3.5–5.2)
Alkaline Phosphatase: 70 U/L (ref 39–117)
BILIRUBIN TOTAL: 0.5 mg/dL (ref 0.2–1.2)
BUN: 15 mg/dL (ref 6–23)
CO2: 26 mEq/L (ref 19–32)
Calcium: 9.5 mg/dL (ref 8.4–10.5)
Chloride: 102 mEq/L (ref 96–112)
Creat: 0.83 mg/dL (ref 0.50–1.10)
Glucose, Bld: 85 mg/dL (ref 70–99)
POTASSIUM: 3.9 meq/L (ref 3.5–5.3)
Sodium: 139 mEq/L (ref 135–145)
Total Protein: 7 g/dL (ref 6.0–8.3)

## 2013-12-19 MED ORDER — PAROXETINE MESYLATE 7.5 MG PO CAPS: ORAL_CAPSULE | ORAL | Status: DC

## 2013-12-19 MED ORDER — CELECOXIB 100 MG PO CAPS: 100.0000 mg | ORAL_CAPSULE | Freq: Every day | ORAL | Status: DC

## 2013-12-19 NOTE — Assessment & Plan Note (Signed)
Multiple joints affected. She's been on a host of different anti-inflammatory she did better on the Celebrex. I do not see a reason why it was discontinued Alesse which is trying to try something different. I will put her back on low-dose Celebrex 100 mg once a day she has felt diclofenac Naprosyn ibuprofen

## 2013-12-19 NOTE — Progress Notes (Signed)
Patient ID: Rhonda [name], female   DOB: 05/24/1961, 53 y.o.   MRN: [id number]   Subjective:    Patient ID: Rhonda [name], female    DOB: 08/21/1960, 53 y.o.   MRN: [id number]  Patient presents for Medication review/ refill  patient here to followup medications. She's not been seen in all of the year. She has multiple concerns.  Hypertension she's been out of her blood pressure medicine for a couple of days. She is due for fasting labs. She's not had any difficulty with hydrochlorothiazide.  Osteoarthritis/joint pain she is still being followed by orthopedics. She's currently on diclofenac as well as gabapentin but has some difficulty taking the gabapentin due to the side effects. She wants to return back to Celebrex states she was taken off of it but she does not know why. She's been on Naprosyn diclofenac other over-the-counter anti-inflammatories. She did better with the Celebrex.  Hot flashes vaginal dryness she was given estrogen cream for the vaginal dryness which helps however is too expensive she did start using some type of herbal progesterone that she got over-the-counter which does help. She does not want to go on hormone therapy by mouth will like other options. She's tried TransMontaigneEstroven as well as the black cohosh with no improvement in symptoms.  She's a small bump near her right eye  it is been present for years. Is not causing any pain no itching no change in her vision.  She is due for mammogram she is due for colonoscopy will like to be set up for these She also has a form that she needs completed for her job requires a PPD Review Of Systems:  GEN- denies fatigue, fever, weight loss,weakness, recent illness HEENT- denies eye drainage, change in vision, nasal discharge, CVS- denies chest pain, palpitations RESP- denies SOB, cough, wheeze ABD- denies N/V, change in stools, abd pain GU- denies dysuria, hematuria, dribbling, incontinence MSK- +oint pain, muscle aches, injury Neuro-  denies headache, dizziness, syncope, seizure activity       Objective:    BP 132/78  Pulse 70  Temp(Src) 97.7 F (36.5 C) (Oral)  Resp 16  Ht 5\' 4"  (1.626 m)  Wt 199 lb (90.266 kg)  BMI 34.14 kg/m2 GEN- NAD, alert and oriented x3, obese HEENT- PERRL, EOMI, non injected sclera, pink conjunctiva, MMM, oropharynx clear Neck- Supple, no thyromegaly CVS- RRR, no murmur RESP-CTAB EXT- No edema Skin- hyperpigmented spots on inner thigh and leg, corner of right eye small flesh toned papular lesion Psych- normal affect and mood Pulses- Radial, DP- 2+        Assessment & Plan:      Problem List Items Addressed This Visit   Vaginal atrophy   Menopausal hot flushes   Essential hypertension, benign - Primary   Relevant Orders      CBC with Differential      Comprehensive metabolic panel      Lipid panel    Other Visit Diagnoses   Encounter for vitamin deficiency screening        Relevant Orders       Vitamin D, 25-hydroxy    Other screening mammogram        Relevant Orders       MM DIGITAL SCREENING BILATERAL    Colon cancer screening        Relevant Orders       Ambulatory referral to Gastroenterology    Screening for tuberculosis        Relevant Orders  TB Skin Test (Completed)       Note: This dictation was prepared with Dragon dictation along with smaller phrase technology. Any transcriptional errors that result from this process are unintentional.

## 2013-12-19 NOTE — Assessment & Plan Note (Signed)
Will monitor, she declines removal at this time

## 2013-12-19 NOTE — Assessment & Plan Note (Signed)
Continue hydrochlorothiazide. 

## 2013-12-19 NOTE — Telephone Encounter (Signed)
Received fax from [facility] requesting PA for Celebrex.   PA submitted and approved.   Pharmacy made aware.

## 2013-12-19 NOTE — Patient Instructions (Addendum)
Stop the diclofenac and the naprosyn  We will call with lab results TB placed  [facility] and Lamb  Mammogram to be scheduled  Referral for colonoscopy  Try the new brisdelle for hot flashes F/U 3 months

## 2013-12-19 NOTE — Assessment & Plan Note (Signed)
She does not want to do hormonal therapy. I will try her on the new brisdelle, she is unable to afford that estrogen

## 2013-12-19 NOTE — Assessment & Plan Note (Signed)
She states that she actually gained up to 214 pounds and is been working on her diet and exercise she walks  nearly every day to help with the weight loss.

## 2013-12-20 ENCOUNTER — Telehealth: Payer: Self-pay | Admitting: *Deleted

## 2013-12-20 LAB — VITAMIN D 25 HYDROXY (VIT D DEFICIENCY, FRACTURES): VIT D 25 HYDROXY: 60 ng/mL (ref 30–89)

## 2013-12-20 NOTE — Telephone Encounter (Signed)
Returned call to patient.   States that Paroxetine noted to be $135. Requested to have MD advise on more cost effective therapy.

## 2013-12-21 ENCOUNTER — Ambulatory Visit: Payer: BC Managed Care – PPO | Admitting: *Deleted

## 2013-12-21 ENCOUNTER — Other Ambulatory Visit: Payer: Self-pay | Admitting: Family Medicine

## 2013-12-21 DIAGNOSIS — Z111 Encounter for screening for respiratory tuberculosis: Secondary | ICD-10-CM

## 2013-12-21 LAB — TB SKIN TEST
Induration: 0 mm
TB Skin Test: NEGATIVE

## 2013-12-21 MED ORDER — SIMVASTATIN 20 MG PO TABS: 20.0000 mg | ORAL_TABLET | Freq: Every day | ORAL | Status: DC

## 2013-12-21 NOTE — Telephone Encounter (Signed)
Call placed to pharmacy.   New prescription for Paxil CR is $112.01.   Was advised that patient has deductible for prescriptions that has not been met.   MD made aware and advised that no other alternatives noted.   Call placed to patient and patient made aware.

## 2013-12-21 NOTE — Telephone Encounter (Signed)
Try paxil CR 12.5mg  once a day , to see if this is cheaper

## 2013-12-25 ENCOUNTER — Telehealth: Payer: Self-pay | Admitting: *Deleted

## 2013-12-25 MED ORDER — ATENOLOL 25 MG PO TABS: ORAL_TABLET | ORAL | Status: DC

## 2013-12-25 MED ORDER — HYDROCHLOROTHIAZIDE 25 MG PO TABS: ORAL_TABLET | ORAL | Status: DC

## 2013-12-25 MED ORDER — VALACYCLOVIR HCL 500 MG PO TABS: 500.0000 mg | ORAL_TABLET | ORAL | Status: DC | PRN

## 2013-12-25 NOTE — Telephone Encounter (Signed)
Message copied by Phillips OdorSIX, CHRISTINA H on Tue Dec 25, 2013  3:12 PM ------      Message from: Malvin JohnsBULLINS, SUSAN S      Created: Tue Dec 25, 2013  3:03 PM       Patient did not receive her blood pressure medication at the pharmacy and also has questions about her blood pressure mediation       [phone] ------

## 2013-12-25 NOTE — Telephone Encounter (Signed)
Call returned to patient.   Prescriptions sent to pharmacy.

## 2013-12-31 ENCOUNTER — Telehealth: Payer: Self-pay | Admitting: *Deleted

## 2013-12-31 MED ORDER — PRAVASTATIN SODIUM 40 MG PO TABS: 40.0000 mg | ORAL_TABLET | Freq: Every day | ORAL | Status: DC

## 2013-12-31 NOTE — Telephone Encounter (Signed)
Received call from patient.   Reports that she has been taking Zocor at night as prescribed, but she has been having bouts of itchiness all over body and sometimes she notes hives.   Advised to stop taking Zocor. She states that she is currently taking Benadryl for itching.   Requested MD to advise on new generic medication for cholesterol.

## 2013-12-31 NOTE — Telephone Encounter (Signed)
Call placed to patient and patient made aware per VM.   Prescription sent to pharmacy.  

## 2013-12-31 NOTE — Telephone Encounter (Signed)
Take benadryl and a claritin OTC Wait 1 week, then start pravastatin 40mg  at bedtime - send  script

## 2014-01-01 ENCOUNTER — Encounter (INDEPENDENT_AMBULATORY_CARE_PROVIDER_SITE_OTHER): Payer: Self-pay | Admitting: *Deleted

## 2014-01-14 ENCOUNTER — Encounter: Payer: Self-pay | Admitting: Internal Medicine

## 2014-01-20 ENCOUNTER — Other Ambulatory Visit: Payer: Self-pay | Admitting: Family Medicine

## 2014-01-21 NOTE — Telephone Encounter (Signed)
Refill appropriate and filled per protocol. 

## 2014-03-01 ENCOUNTER — Encounter: Payer: Self-pay | Admitting: Gastroenterology

## 2014-03-01 ENCOUNTER — Telehealth: Payer: Self-pay | Admitting: *Deleted

## 2014-03-01 NOTE — Telephone Encounter (Signed)
Dr Marina Goodell: pt is scheduled for direct colonoscopy 03/20/2014.  Last colonoscopy 2008 with Dr Jarold Motto.  (Report in EPIC for review)  Colonoscopy normal; chronic IBS.  I talked with pt on the phone; she thought she was due for colon now.  She has no family hx colon cancer.  She does say that she is currently having issues with constipation and takes Miralax to help with BM.  She has noticed mucus in her stool and that is new for her.  Does she need OV? Is she due for recall colon 2018?  Thanks, Olegario Messier

## 2014-03-02 NOTE — Telephone Encounter (Signed)
Rhonda [name], I did not see that we sent her a recall letter. Did we? Also, there is a letter in [name] (July 2015) stating that she was having a colonoscopy with Dr Karilyn Cota in [place]. Not sure what that's about..Do you? Thanks. Dr Marina Goodell

## 2014-03-05 NOTE — Telephone Encounter (Signed)
If the patient is having change in bowel habits with bleeding, and her PCP felt this needed to be evaluated, then colonoscopy would be appropriate. This would not be "routine followup".

## 2014-03-05 NOTE — Telephone Encounter (Signed)
Left pt a message on her mobile and home number to return call  And that we need to proceed with colon as scheduled, to keep PV appt for 9-9 at 11 am. Minerva Ends RN

## 2014-03-05 NOTE — Telephone Encounter (Signed)
pts last colon was 02-2007 with patterson and was normal. Rhonda [name]  Pre visit  Dr Arlyce Dice, can you look at this for me.  Please advise.  Pt has pre visit for tomorrow.

## 2014-03-05 NOTE — Telephone Encounter (Signed)
Rhonda Claudean Kinds spoke with Rhonda [name] this morning and and she states that her PCP doctor referred her to Rhonda [name] without asking the pt her preference so she has not seen Rhonda [name] nor does she have an appointment scheduled with him.  She has not received a recall letter from [place]. She saw her PCP doctor and told her that she was having change in bowels, sometimes stringy and other times she has noticed blood in her stool that is why her PCP doctor recommended a colonoscopy. Pt states she doesn't want to do this if its not time. Please advise.  Rhonda [name]  Pre Visit

## 2014-03-06 ENCOUNTER — Ambulatory Visit (AMBULATORY_SURGERY_CENTER): Payer: Self-pay

## 2014-03-06 VITALS — Ht 64.5 in | Wt 199.0 lb

## 2014-03-06 DIAGNOSIS — R194 Change in bowel habit: Secondary | ICD-10-CM

## 2014-03-06 DIAGNOSIS — R198 Other specified symptoms and signs involving the digestive system and abdomen: Secondary | ICD-10-CM

## 2014-03-06 MED ORDER — MOVIPREP 100 G PO SOLR: 1.0000 | Freq: Once | ORAL | Status: DC

## 2014-03-06 NOTE — Progress Notes (Signed)
No allergies to eggs or soy No home oxygen No diet/weight loss meds No past problems with anesthesia  Has email  Emmi instructions given for colonoscopy 

## 2014-03-11 ENCOUNTER — Encounter: Payer: Self-pay | Admitting: Internal Medicine

## 2014-03-20 ENCOUNTER — Ambulatory Visit (AMBULATORY_SURGERY_CENTER): Payer: BC Managed Care – PPO | Admitting: Internal Medicine

## 2014-03-20 ENCOUNTER — Encounter: Payer: Self-pay | Admitting: Internal Medicine

## 2014-03-20 VITALS — BP 179/93 | HR 79 | Temp 98.0°F | Resp 36 | Ht 64.5 in | Wt 199.0 lb

## 2014-03-20 DIAGNOSIS — K625 Hemorrhage of anus and rectum: Secondary | ICD-10-CM

## 2014-03-20 DIAGNOSIS — R198 Other specified symptoms and signs involving the digestive system and abdomen: Secondary | ICD-10-CM

## 2014-03-20 HISTORY — PX: COLONOSCOPY WITH PROPOFOL: SHX5780

## 2014-03-20 MED ORDER — SODIUM CHLORIDE 0.9 % IV SOLN: 500.0000 mL | INTRAVENOUS | Status: DC

## 2014-03-20 NOTE — Op Note (Signed)
[facility] [address] [place], [address]   COLONOSCOPY PROCEDURE REPORT  PATIENT: Rhonda [name], Rhonda [name]  MR#: [id number] BIRTHDATE: May 14, 1961 , 53  yrs. old GENDER: female ENDOSCOPIST: Roxy Cedar, MD REFERRED XB:[id number] [place], M.D. PROCEDURE DATE:  03/20/2014 PROCEDURE:   Colonoscopy, diagnostic First Screening Colonoscopy - Avg.  risk and is 50 yrs.  old or older - No.  Prior Negative Screening - Now for repeat screening. N/A  History of Adenoma - Now for follow-up colonoscopy & has been > or = to 3 yrs.  N/A  Polyps Removed Today? No.  Recommend repeat exam, <10 yrs? No. ASA CLASS:   Class II INDICATIONS:change in bowel habits, constipation, and rectal bleeding. Previous exam 2008-normal. MEDICATIONS: Monitored anesthesia care and Propofol 400 mg  DESCRIPTION OF PROCEDURE:   After the risks benefits and alternatives of the procedure were thoroughly explained, informed consent was obtained.  The digital rectal exam revealed no abnormalities of the rectum.   The LB [id number]  endoscope was introduced through the anus and advanced to the cecum, which was identified by both the appendix and ileocecal valve. No adverse events experienced.   The quality of the prep was excellent, using MoviPrep  The instrument was then slowly withdrawn as the colon was fully examined.  COLON FINDINGS: A normal appearing cecum, ileocecal valve, and appendiceal orifice were identified.  The ascending, transverse, descending, sigmoid colon, and rectum appeared unremarkable. Retroflexed views revealed internal hemorrhoids. The time to cecum=5 minutes 10 seconds.  Withdrawal time=8 minutes 10 seconds. The scope was withdrawn and the procedure completed. COMPLICATIONS: There were no complications.  ENDOSCOPIC IMPRESSION: 1. Normal colonoscopy  RECOMMENDATIONS: 1. Continue current colorectal screening recommendations for "routine risk" patients with a repeat colonoscopy  in 10 years. 2. MiraLax as needed for bowels  eSigned:  Roxy Cedar, MD 03/20/2014 10:15 AM   cc: Milinda Antis MD and The Patient

## 2014-03-20 NOTE — Progress Notes (Signed)
PT IN BATHRM ,NO FLATUS PASSED IN BR,DR PERRY INFORMED OF LEFT UPPER ABD DISCOMFORT ,PT GIVEN WARM COFFEE AND DR Marina Goodell SAID PT OK FOR D/C

## 2014-03-20 NOTE — Patient Instructions (Signed)
YOU HAD AN ENDOSCOPIC PROCEDURE TODAY AT THE [facility]: Refer to the procedure report that was given to you for any specific questions about what was found during the examination.  If the procedure report does not answer your questions, please call your gastroenterologist to clarify.  If you requested that your care partner not be given the details of your procedure findings, then the procedure report has been included in a sealed envelope for you to review at your convenience later.  YOU SHOULD EXPECT: Some feelings of bloating in the abdomen. Passage of more gas than usual.  Walking can help get rid of the air that was put into your GI tract during the procedure and reduce the bloating. If you had a lower endoscopy (such as a colonoscopy or flexible sigmoidoscopy) you may notice spotting of blood in your stool or on the toilet paper. If you underwent a bowel prep for your procedure, then you may not have a normal bowel movement for a few days.  DIET: Your first meal following the procedure should be a light meal and then it is ok to progress to your normal diet.  A half-sandwich or bowl of soup is an example of a good first meal.  Heavy or fried foods are harder to digest and may make you feel nauseous or bloated.  Likewise meals heavy in dairy and vegetables can cause extra gas to form and this can also increase the bloating.  Drink plenty of fluids but you should avoid alcoholic beverages for 24 hours.  ACTIVITY: Your care partner should take you home directly after the procedure.  You should plan to take it easy, moving slowly for the rest of the day.  You can resume normal activity the day after the procedure however you should NOT DRIVE or use heavy machinery for 24 hours (because of the sedation medicines used during the test).    SYMPTOMS TO REPORT IMMEDIATELY: A gastroenterologist can be reached at any hour.  During normal business hours, 8:30 AM to 5:00 PM Monday through Friday,  call [phone].  After hours and on weekends, please call the GI answering service at [phone] who will take a message and have the physician on call contact you.   Following lower endoscopy (colonoscopy or flexible sigmoidoscopy):  Excessive amounts of blood in the stool  Significant tenderness or worsening of abdominal pains  Swelling of the abdomen that is new, acute  Fever of 100F or higher    FOLLOW UP: If any biopsies were taken you will be contacted by phone or by letter within the next 1-3 weeks.  Call your gastroenterologist if you have not heard about the biopsies in 3 weeks.  Our staff will call the home number listed on your records the next business day following your procedure to check on you and address any questions or concerns that you may have at that time regarding the information given to you following your procedure. This is a courtesy call and so if there is no answer at the home number and we have not heard from you through the emergency physician on call, we will assume that you have returned to your regular daily activities without incident.  SIGNATURES/CONFIDENTIALITY: You and/or your care partner have signed paperwork which will be entered into your electronic medical record.  These signatures attest to the fact that that the information above on your After Visit Summary has been reviewed and is understood.  Full responsibility of the confidentiality   of this discharge information lies with you and/or your care-partner.     

## 2014-03-20 NOTE — Progress Notes (Signed)
Report to PACU, RN, vss, BBS= Clear.  

## 2014-03-20 NOTE — Progress Notes (Signed)
PT REQUESTED TO GO TO THE RESTROOM AND TO AMBULATE TO AID PASSING MORE FLATUS.  PH

## 2014-03-21 ENCOUNTER — Telehealth: Payer: Self-pay | Admitting: *Deleted

## 2014-03-21 NOTE — Telephone Encounter (Signed)
  Follow up Call-  Call back number 03/20/2014  Post procedure Call Back phone  # [phone]  Permission to leave phone message Yes     Patient questions:  Do you have a fever, pain , or abdominal swelling? No. Pain Score  0 *  Have you tolerated food without any problems? Yes.    Have you been able to return to your normal activities? Yes.    Do you have any questions about your discharge instructions: Diet   No. Medications  No. Follow up visit  No.  Do you have questions or concerns about your Care? Yes.    Pt complains of continued gas and bloating.  She is able to expel gas in small amounts.  I gave her some suggestions as far as warm fluids and changing positions.  She has tried Psychiatric nurse X.  She will continue to try these suggestions and will call back if no improvement later today.    Actions: * If pain score is 4 or above: No action needed, pain <4.

## 2014-03-22 ENCOUNTER — Ambulatory Visit: Payer: BC Managed Care – PPO | Admitting: Family Medicine

## 2014-03-29 ENCOUNTER — Ambulatory Visit: Payer: BC Managed Care – PPO | Admitting: Family Medicine

## 2014-04-05 ENCOUNTER — Ambulatory Visit: Payer: BC Managed Care – PPO | Admitting: Family Medicine

## 2014-06-10 ENCOUNTER — Other Ambulatory Visit: Payer: Self-pay | Admitting: Family Medicine

## 2014-06-10 NOTE — Telephone Encounter (Signed)
Medication filled x1 with no refills.   Requires office visit before any further refills can be given.   Letter sent.  

## 2014-07-07 ENCOUNTER — Other Ambulatory Visit: Payer: Self-pay | Admitting: Family Medicine

## 2014-07-08 NOTE — Telephone Encounter (Signed)
Medication filled x1 with no refills.   Requires office visit before any further refills can be given.   Letter sent.  

## 2014-07-30 ENCOUNTER — Ambulatory Visit: Payer: Self-pay | Admitting: Family Medicine

## 2014-08-02 ENCOUNTER — Ambulatory Visit (INDEPENDENT_AMBULATORY_CARE_PROVIDER_SITE_OTHER): Payer: BLUE CROSS/BLUE SHIELD | Admitting: Family Medicine

## 2014-08-02 ENCOUNTER — Other Ambulatory Visit: Payer: Self-pay | Admitting: Family Medicine

## 2014-08-02 ENCOUNTER — Encounter: Payer: Self-pay | Admitting: Family Medicine

## 2014-08-02 VITALS — BP 142/78 | HR 68 | Temp 98.6°F | Resp 16 | Ht 65.0 in | Wt 204.0 lb

## 2014-08-02 DIAGNOSIS — E785 Hyperlipidemia, unspecified: Secondary | ICD-10-CM

## 2014-08-02 DIAGNOSIS — M609 Myositis, unspecified: Secondary | ICD-10-CM

## 2014-08-02 DIAGNOSIS — E669 Obesity, unspecified: Secondary | ICD-10-CM

## 2014-08-02 DIAGNOSIS — M791 Myalgia: Secondary | ICD-10-CM

## 2014-08-02 DIAGNOSIS — M159 Polyosteoarthritis, unspecified: Secondary | ICD-10-CM

## 2014-08-02 DIAGNOSIS — IMO0001 Reserved for inherently not codable concepts without codable children: Secondary | ICD-10-CM

## 2014-08-02 DIAGNOSIS — Z1231 Encounter for screening mammogram for malignant neoplasm of breast: Secondary | ICD-10-CM

## 2014-08-02 DIAGNOSIS — I10 Essential (primary) hypertension: Secondary | ICD-10-CM

## 2014-08-02 LAB — CBC WITH DIFFERENTIAL/PLATELET
BASOS ABS: 0 10*3/uL (ref 0.0–0.1)
BASOS PCT: 0 % (ref 0–1)
Eosinophils Absolute: 0.1 10*3/uL (ref 0.0–0.7)
Eosinophils Relative: 2 % (ref 0–5)
HCT: 40.9 % (ref 36.0–46.0)
Hemoglobin: 14 g/dL (ref 12.0–15.0)
LYMPHS PCT: 39 % (ref 12–46)
Lymphs Abs: 1.9 10*3/uL (ref 0.7–4.0)
MCH: 29.9 pg (ref 26.0–34.0)
MCHC: 34.2 g/dL (ref 30.0–36.0)
MCV: 87.4 fL (ref 78.0–100.0)
MPV: 9.8 fL (ref 8.6–12.4)
Monocytes Absolute: 0.6 10*3/uL (ref 0.1–1.0)
Monocytes Relative: 12 % (ref 3–12)
Neutro Abs: 2.3 10*3/uL (ref 1.7–7.7)
Neutrophils Relative %: 47 % (ref 43–77)
PLATELETS: 255 10*3/uL (ref 150–400)
RBC: 4.68 MIL/uL (ref 3.87–5.11)
RDW: 15 % (ref 11.5–15.5)
WBC: 4.9 10*3/uL (ref 4.0–10.5)

## 2014-08-02 LAB — COMPREHENSIVE METABOLIC PANEL
ALBUMIN: 4.3 g/dL (ref 3.5–5.2)
ALK PHOS: 75 U/L (ref 39–117)
ALT: 11 U/L (ref 0–35)
AST: 16 U/L (ref 0–37)
BUN: 13 mg/dL (ref 6–23)
CO2: 25 mEq/L (ref 19–32)
CREATININE: 0.75 mg/dL (ref 0.50–1.10)
Calcium: 9.3 mg/dL (ref 8.4–10.5)
Chloride: 104 mEq/L (ref 96–112)
Glucose, Bld: 85 mg/dL (ref 70–99)
Potassium: 4 mEq/L (ref 3.5–5.3)
SODIUM: 141 meq/L (ref 135–145)
TOTAL PROTEIN: 6.8 g/dL (ref 6.0–8.3)
Total Bilirubin: 0.5 mg/dL (ref 0.2–1.2)

## 2014-08-02 LAB — LIPID PANEL
Cholesterol: 228 mg/dL — ABNORMAL HIGH (ref 0–200)
HDL: 63 mg/dL (ref >39)
LDL CALC: 143 mg/dL — AB (ref 0–99)
TRIGLYCERIDES: 108 mg/dL (ref <150)
Total CHOL/HDL Ratio: 3.6 Ratio
VLDL: 22 mg/dL (ref 0–40)

## 2014-08-02 LAB — RHEUMATOID FACTOR: Rhuematoid fact SerPl-aCnc: <10 IU/mL (ref <=14)

## 2014-08-02 LAB — C-REACTIVE PROTEIN

## 2014-08-02 LAB — SEDIMENTATION RATE: Sed Rate: 8 mm/hr (ref 0–22)

## 2014-08-02 MED ORDER — PAROXETINE HCL ER 12.5 MG PO TB24: 12.5000 mg | ORAL_TABLET | Freq: Every day | ORAL | Status: DC

## 2014-08-02 MED ORDER — CYCLOBENZAPRINE HCL 10 MG PO TABS: 10.0000 mg | ORAL_TABLET | Freq: Three times a day (TID) | ORAL | Status: DC | PRN

## 2014-08-02 MED ORDER — HYDROCHLOROTHIAZIDE 25 MG PO TABS: 25.0000 mg | ORAL_TABLET | Freq: Every day | ORAL | Status: DC

## 2014-08-02 MED ORDER — ATENOLOL 25 MG PO TABS: 25.0000 mg | ORAL_TABLET | Freq: Every day | ORAL | Status: DC

## 2014-08-02 NOTE — Patient Instructions (Signed)
Try the muscle relaxer REstart your blood pressure medication Your EKG shows strain on your heart- heart healthy diet, no fried foods, no soda, more water, fruits and veggies We will call with lab results F/U 2 months for weight and blood pressure check

## 2014-08-02 NOTE — Assessment & Plan Note (Signed)
Continue mobic during day, labs per above

## 2014-08-02 NOTE — Assessment & Plan Note (Signed)
Uncontrolled some mild strain noted with LVH and her obesity Reiterated weight control and medication compliance Check labs recheck in 8 weeks

## 2014-08-02 NOTE — Progress Notes (Signed)
Patient ID: Rhonda [name], female   DOB: 11/29/1960, 54 y.o.   MRN: [id number]   Subjective:    Patient ID: Rhonda Schleinonya G Kihn, female    DOB: 09/03/1960, 54 y.o.   MRN: [id number]  Patient presents for F/U patient here to follow-up medications. She has not had her blood pressure medication in a couple weeks. She is now back on her diet and trying to watch what she eats and trying to lose weight she did gain a significant amount around the holidays. She continues to suffer with chronic joint pains including her neck shoulder or her knees. [facility] and they have given her injections in her shoulder as well as her knees with minimal improvement. She had some nausea with some pain in her left arm initially she was fearful of a heart attack it did ease up without any intervention. She was also recently treated with prednisone for her left shoulder pain states she has had imaging of her neck by her orthopedist they told her that she has some degenerative changes in her cervical spine but it is mostly musculoskeletal spasm and a possible pinched nerve that is causing the pain in her shoulder. She states that her joints hurt all over despite the interventions. She is on gabapentin however she cannot take during the day because it makes her very somnolent. She also takes meloxicam once a day.    Review Of Systems:  GEN- denies fatigue, fever, weight loss,weakness, recent illness HEENT- denies eye drainage, change in vision, nasal discharge, CVS- denies chest pain, palpitations RESP- denies SOB, cough, wheeze ABD- denies N/V, change in stools, abd pain GU- denies dysuria, hematuria, dribbling, incontinence MSK- +joint pain, muscle aches, injury Neuro- denies headache, dizziness, syncope, seizure activity       Objective:    BP 142/78 mmHg  Pulse 68  Temp(Src) 98.6 F (37 C) (Oral)  Resp 16  Ht 5\' 5"  (1.651 m)  Wt 204 lb (92.534 kg)  BMI 33.95 kg/m2 GEN- NAD, alert  and oriented x3 HEENT- PERRL, EOMI, non injected sclera, pink conjunctiva, MMM, oropharynx clear Neck- Supple, no thyromegaly CVS- RRR, no murmur RESP-CTAB ABD-NABS,soft,NT,ND EXT- No edema Pulses- Radial, DP- 2+  EKG- NSR, mild LVF, nonspecific t wave changes       Assessment & Plan:      Problem List Items Addressed This Visit      Unprioritized   Obesity   Hyperlipidemia   Relevant Medications   hydrochlorothiazide tablet   atenolol (TENORMIN) tablet   Other Relevant Orders   Lipid panel   Generalized OA   Relevant Medications   meloxicam (MOBIC) 7.5 MG tablet   HYDROcodone-acetaminophen (NORCO/VICODIN) 5-325 MG per tablet   cyclobenzaprine (FLEXERIL) tablet   Other Relevant Orders   Sedimentation Rate   Essential hypertension, benign - Primary   Relevant Medications   hydrochlorothiazide tablet   atenolol (TENORMIN) tablet   Other Relevant Orders   CBC with Differential/Platelet   Comprehensive metabolic panel   EKG 12-Lead (Completed)    Other Visit Diagnoses    Myalgia and myositis        Check basic RHeumatoid labs, known OA, given flexeril for bedtime, not tot ake with gabapentin    Relevant Orders    Sedimentation Rate    C-reactive protein    Rheumatoid factor    ANA       Note: This dictation was prepared with Dragon dictation along with smaller phrase technology. Any  transcriptional errors that result from this process are unintentional.

## 2014-08-05 LAB — ANA: Anti Nuclear Antibody(ANA): NEGATIVE

## 2014-08-15 ENCOUNTER — Ambulatory Visit (HOSPITAL_COMMUNITY)
Admission: RE | Admit: 2014-08-15 | Discharge: 2014-08-15 | Disposition: A | Discharge status: Home / Self Care | Payer: BLUE CROSS/BLUE SHIELD | Source: Ambulatory Visit | Attending: Family Medicine | Admitting: Family Medicine

## 2014-08-15 DIAGNOSIS — Z1231 Encounter for screening mammogram for malignant neoplasm of breast: Secondary | ICD-10-CM | POA: Insufficient documentation

## 2014-10-01 ENCOUNTER — Ambulatory Visit: Payer: BLUE CROSS/BLUE SHIELD | Admitting: Family Medicine

## 2014-10-14 ENCOUNTER — Ambulatory Visit (INDEPENDENT_AMBULATORY_CARE_PROVIDER_SITE_OTHER): Payer: BLUE CROSS/BLUE SHIELD | Admitting: Family Medicine

## 2014-10-14 ENCOUNTER — Encounter: Payer: Self-pay | Admitting: Family Medicine

## 2014-10-14 VITALS — BP 138/88 | HR 86 | Temp 98.6°F | Resp 16 | Ht 65.0 in | Wt 198.0 lb

## 2014-10-14 DIAGNOSIS — I10 Essential (primary) hypertension: Secondary | ICD-10-CM

## 2014-10-14 DIAGNOSIS — E669 Obesity, unspecified: Secondary | ICD-10-CM | POA: Diagnosis not present

## 2014-10-14 DIAGNOSIS — N951 Menopausal and female climacteric states: Secondary | ICD-10-CM | POA: Diagnosis not present

## 2014-10-14 DIAGNOSIS — M159 Polyosteoarthritis, unspecified: Secondary | ICD-10-CM | POA: Diagnosis not present

## 2014-10-14 MED ORDER — MELOXICAM 7.5 MG PO TABS: 7.5000 mg | ORAL_TABLET | Freq: Every day | ORAL | Status: DC

## 2014-10-14 MED ORDER — GABAPENTIN 300 MG PO CAPS: 300.0000 mg | ORAL_CAPSULE | Freq: Two times a day (BID) | ORAL | Status: DC

## 2014-10-14 NOTE — Assessment & Plan Note (Signed)
Blood pressure is improved today. She will continue current medications

## 2014-10-14 NOTE — Assessment & Plan Note (Signed)
Continue meloxicam as needed for her arthritis her autoimmune labs were normal

## 2014-10-14 NOTE — Assessment & Plan Note (Signed)
Weight loss is noted which is great for her. I think will help improve her other comorbidities and her osteoarthritis. She will continue with healthy eating and trying to increase her activity

## 2014-10-14 NOTE — Patient Instructions (Signed)
Continue current medications Medication refilled  F/U 4 months

## 2014-10-14 NOTE — Progress Notes (Signed)
Patient ID: Rhonda [name], female   DOB: 11/23/1960, 54 y.o.   MRN: [id number]   Subjective:    Patient ID: Rhonda Schleinonya G Unangst, female    DOB: 03/07/1961, 54 y.o.   MRN: [id number]  Patient presents for 2 month F/U  Patient here for interim follow-up. She has been taking her blood pressure medicine as prescribed. She's also changed her diet and states that she is down 12 pounds based on her scale at home. She has had some improvement in her joint pain. She does continue to take meloxicam as needed. She's not been taking her gabapentin and this was prescribed she was getting tingling and numbness in her hands. She has no new concerns today The Paxil has been helping her hot flashes and her mood  Review Of Systems:  GEN- denies fatigue, fever, weight loss,weakness, recent illness HEENT- denies eye drainage, change in vision, nasal discharge, CVS- denies chest pain, palpitations RESP- denies SOB, cough, wheeze ABD- denies N/V, change in stools, abd pain GU- denies dysuria, hematuria, dribbling, incontinence MSK- + joint pain, muscle aches, injury Neuro- denies headache, dizziness, syncope, seizure activity       Objective:    BP 138/88 mmHg  Pulse 86  Temp(Src) 98.6 F (37 C) (Oral)  Resp 16  Ht 5\' 5"  (1.651 m)  Wt 198 lb (89.812 kg)  BMI 32.95 kg/m2 GEN- NAD, alert and oriented x3 HEENT- PERRL, EOMI, non injected sclera, pink conjunctiva, MMM, oropharynx clear CVS- RRR, no murmur RESP-CTAB EXT- No edema Pulses- Radial 2+        Assessment & Plan:      Problem List Items Addressed This Visit    Obesity    Weight loss is noted which is great for her. I think will help improve her other comorbidities and her osteoarthritis. She will continue with healthy eating and trying to increase her activity      Menopausal hot flushes   Generalized OA    Continue meloxicam as needed for her arthritis her autoimmune labs were normal      Relevant Medications   meloxicam (MOBIC) 7.5  MG tablet   Essential hypertension, benign - Primary    Blood pressure is improved today. She will continue current medications         Note: This dictation was prepared with Dragon dictation along with smaller phrase technology. Any transcriptional errors that result from this process are unintentional.

## 2014-10-23 ENCOUNTER — Ambulatory Visit: Payer: BLUE CROSS/BLUE SHIELD

## 2014-10-23 ENCOUNTER — Ambulatory Visit (INDEPENDENT_AMBULATORY_CARE_PROVIDER_SITE_OTHER): Payer: BLUE CROSS/BLUE SHIELD | Admitting: Podiatry

## 2014-10-23 ENCOUNTER — Encounter: Payer: Self-pay | Admitting: Podiatry

## 2014-10-23 ENCOUNTER — Ambulatory Visit (INDEPENDENT_AMBULATORY_CARE_PROVIDER_SITE_OTHER): Payer: BLUE CROSS/BLUE SHIELD

## 2014-10-23 VITALS — BP 144/93 | HR 75 | Resp 12 | Ht 64.5 in | Wt 190.0 lb

## 2014-10-23 DIAGNOSIS — M722 Plantar fascial fibromatosis: Secondary | ICD-10-CM | POA: Diagnosis not present

## 2014-10-23 DIAGNOSIS — M779 Enthesopathy, unspecified: Secondary | ICD-10-CM | POA: Diagnosis not present

## 2014-10-23 DIAGNOSIS — M79671 Pain in right foot: Secondary | ICD-10-CM

## 2014-10-23 MED ORDER — TRIAMCINOLONE ACETONIDE 10 MG/ML IJ SUSP
10.0000 mg | Freq: Once | INTRAMUSCULAR | Status: AC
  Administered 2014-10-23: 10 mg

## 2014-10-23 NOTE — Patient Instructions (Signed)

## 2014-10-23 NOTE — Progress Notes (Signed)
   Subjective:    Patient ID: Rhonda [name], female    DOB: 09/10/1960, 54 y.o.   MRN: [id number]  HPI Comments: Pt states she lost 15lb with walking and the left heel has been hurting over the last 3 - 4 weeks.  Right 1st toenail has darkness subungal with a hx of the toenail coming off.  Right plantar forefoot 2, 3 toes is tender like before when the fluid was drawn off.  Foot Pain      Review of Systems  All other systems reviewed and are negative.      Objective:   Physical Exam        Assessment & Plan:

## 2014-10-24 NOTE — Progress Notes (Signed)
Subjective:     Patient ID: Rhonda [name], female   DOB: 12/05/1960, 54 y.o.   MRN: [id number]  HPI patient presents stating I'm having a lot of pain in my left heel and I'm getting discomfort in my second joint right and I was concerned about some discoloration my big toenail right fourth toenail left that occurred recently   Review of Systems     Objective:   Physical Exam Neurovascular status intact with muscle strength adequate and no health history changes. Severe discomfort plantar aspect left heel at the insertion into the calcaneus with moderate depression of the arch and noted to have inflammation and pain second MPJ right upon palpation. Patient does have slight discoloration of the lateral side right hallux nail and fourth nail left    Assessment:     Acute plantar fasciitis left with capsulitis second MPJ right and probable trauma to the hallux nail right fourth nail left    Plan:     H&P and x-rays reviewed with patient. Today I went ahead and I injected the left plantar fascia 3 Milligan Kenalog 5 mg Xylocaine and did a proximal nerve block of the right second MPJ aspirated the joint getting out clear fluid and injected with a corner cc of dexamethasone Kenalog and applied pad. For the nails I have recommended letting them grow out and if they persist we may get a biopsy. Patient will be seen back to recheck

## 2014-10-31 ENCOUNTER — Encounter: Payer: Self-pay | Admitting: Podiatry

## 2014-10-31 ENCOUNTER — Ambulatory Visit (INDEPENDENT_AMBULATORY_CARE_PROVIDER_SITE_OTHER): Payer: BLUE CROSS/BLUE SHIELD | Admitting: Podiatry

## 2014-10-31 VITALS — BP 131/88 | HR 81 | Resp 14

## 2014-10-31 DIAGNOSIS — M722 Plantar fascial fibromatosis: Secondary | ICD-10-CM

## 2014-10-31 MED ORDER — TRIAMCINOLONE ACETONIDE 10 MG/ML IJ SUSP
10.0000 mg | Freq: Once | INTRAMUSCULAR | Status: AC
  Administered 2014-10-31: 10 mg

## 2014-10-31 NOTE — Progress Notes (Signed)
Subjective:     Patient ID: Rhonda [name], female   DOB: 07/20/1960, 54 y.o.   MRN: [id number]  HPI patient presents stating my left heel is still bother me a lot especially when I get up in the morning and after sitting   Review of Systems     Objective:   Physical Exam Neurovascular status intact muscle strength adequate with discomfort in the plantar heel left at the insertion of the tendon into the calcaneus with fluid buildup noted    Assessment:     Plantar fasciitis left with inflammation and fluid at the insertion into the calcaneus    Plan:     Reinjected the plantar fascia 3 mg Kenalog 5 mill grams Xylocaine and at this time went ahead and dispensed night splint with instructions on usage. Continue physical therapy supportive shoes and we discussed long-term orthotics and she will bring in the pair that she has from last year for me to evaluate when I see her in 3 weeks

## 2014-11-21 ENCOUNTER — Ambulatory Visit (INDEPENDENT_AMBULATORY_CARE_PROVIDER_SITE_OTHER): Payer: BLUE CROSS/BLUE SHIELD | Admitting: Podiatry

## 2014-11-21 ENCOUNTER — Encounter: Payer: Self-pay | Admitting: Podiatry

## 2014-11-21 ENCOUNTER — Ambulatory Visit: Payer: BLUE CROSS/BLUE SHIELD | Admitting: Podiatry

## 2014-11-21 VITALS — BP 134/90 | HR 69 | Resp 12

## 2014-11-21 DIAGNOSIS — M722 Plantar fascial fibromatosis: Secondary | ICD-10-CM

## 2014-11-23 NOTE — Progress Notes (Signed)
Subjective:     Patient ID: Rhonda [name], female   DOB: 10/02/1960, 54 y.o.   MRN: [id number]  HPI patient presents stating that she was walking and took a misstep and that she felt a pop in her  plantar foot. States it's been sore and the arch and makes it hard to weight-bear   Review of Systems     Objective:   Physical Exam Neurovascular status intact with discomfort in the medial fascial band not at insertion but more distal to it with discomfort upon weightbearing with obesity certainly a complicating factor    Assessment:     Possible tear fibers of the plantar fascia but not a complete tear    Plan:     Explained condition to the patient and at this time went ahead and I applied air fracture walker to immobilize. I discussed take it off and also doing calf massages and that I want her to wear it when weightbearing but does not need to sleep in this. She will be seen back in 3 weeks or earlier if any issues should occur

## 2014-11-26 ENCOUNTER — Telehealth: Payer: Self-pay | Admitting: *Deleted

## 2014-11-26 NOTE — Telephone Encounter (Signed)
Pt states she is wearing the boot given by Dr. Charlsie Merlesegal, but it makes her bad knee swell, she is icing the knee and the foot.  I left a message encouraging the pt to continue to wear the boot and rest more, because the boot was to be used to decrease anymore damage done to the plantar fascia and positioning, continue ice and continue the calf massage as instructed by Dr. Charlsie Merlesegal

## 2014-11-28 ENCOUNTER — Ambulatory Visit: Payer: BLUE CROSS/BLUE SHIELD | Admitting: Podiatry

## 2014-12-03 ENCOUNTER — Encounter: Payer: Self-pay | Admitting: Family Medicine

## 2014-12-03 ENCOUNTER — Ambulatory Visit (INDEPENDENT_AMBULATORY_CARE_PROVIDER_SITE_OTHER): Payer: BLUE CROSS/BLUE SHIELD | Admitting: Family Medicine

## 2014-12-03 VITALS — BP 150/86 | HR 74 | Temp 97.7°F | Resp 18 | Wt 183.0 lb

## 2014-12-03 DIAGNOSIS — J0100 Acute maxillary sinusitis, unspecified: Secondary | ICD-10-CM | POA: Diagnosis not present

## 2014-12-03 DIAGNOSIS — M25512 Pain in left shoulder: Secondary | ICD-10-CM | POA: Diagnosis not present

## 2014-12-03 DIAGNOSIS — I10 Essential (primary) hypertension: Secondary | ICD-10-CM | POA: Diagnosis not present

## 2014-12-03 MED ORDER — METHYLPREDNISOLONE ACETATE 40 MG/ML IJ SUSP
40.0000 mg | Freq: Once | INTRAMUSCULAR | Status: AC
  Administered 2014-12-03: 40 mg via INTRAMUSCULAR

## 2014-12-03 MED ORDER — ATENOLOL 50 MG PO TABS: 50.0000 mg | ORAL_TABLET | Freq: Every day | ORAL | Status: DC

## 2014-12-03 MED ORDER — AZITHROMYCIN 250 MG PO TABS: ORAL_TABLET | ORAL | Status: DC

## 2014-12-03 MED ORDER — METHYLPREDNISOLONE 4 MG PO TBPK: ORAL_TABLET | ORAL | Status: DC

## 2014-12-03 MED ORDER — CETIRIZINE HCL 10 MG PO TABS: 10.0000 mg | ORAL_TABLET | Freq: Every day | ORAL | Status: DC

## 2014-12-03 NOTE — Assessment & Plan Note (Signed)
Uncontrolled, increase atenolol to 50mg  Continue HCTZ

## 2014-12-03 NOTE — Assessment & Plan Note (Signed)
Chronic shoulder pain- rotator cuff syndrome Prednisone should help, ICE, refer to ortho if not improved

## 2014-12-03 NOTE — Progress Notes (Signed)
Patient ID: Rhonda [name], female   DOB: 05/31/1961, 54 y.o.   MRN: [id number]   Subjective:    Patient ID: Rhonda [name], female    DOB: 10/12/1960, 54 y.o.   MRN: [id number]  Patient presents for Sinusitis  patient here with sinus pressure and drainage nasal congestion worsening over the past 4-6 weeks. She thought it was her typical allergy she has been using allergy medication Claritin using nasal sprays using Afrin with minimal improvement. She's not had any significant cough no fever. She now has swelling on her cheeks and a sore throat.  She continues to have left shoulder pain which is been chronic for the past year or so. She does have a special needs son who she has to care for and this does cause a lot of of pain for her. She is also left-handed.   Review Of Systems:  GEN- denies fatigue, fever, weight loss,weakness, recent illness HEENT- denies eye drainage, change in vision, +nasal discharge, CVS- denies chest pain, palpitations RESP- denies SOB, cough, wheeze ABD- denies N/V, change in stools, abd pain GU- denies dysuria, hematuria, dribbling, incontinence MSK- +joint pain, muscle aches, injury Neuro- denies headache, dizziness, syncope, seizure activity       Objective:    BP 150/86 mmHg  Pulse 74  Temp(Src) 97.7 F (36.5 C)  Resp 18  Wt 183 lb (83.008 kg)  SpO2 99% GEN- NAD, alert and oriented x3 HEENT- PERRL, EOMI, non injected sclera, pink conjunctiva, MMM, oropharynx mild injection, TM clear bilat no effusion, nasal congestion + maxillary sinus tenderness, inflammed turbinates,  Nasal drainage  Neck- Supple, no LAD CVS- RRR, no murmur RESP-CTAB MSK- decreased ROM left shoulder, equivial empty can on left side, biceps in tact, motor 4/5 LUE compared to right EXT- No edema Pulses- Radial 2+        Assessment & Plan:      Problem List Items Addressed This Visit    Shoulder pain    Chronic shoulder pain- rotator cuff syndrome Prednisone should help,  ICE, refer to ortho if not improved      Essential hypertension, benign    Uncontrolled, increase atenolol to 50mg  Continue HCTZ      Relevant Medications   atenolol (TENORMIN) 50 MG tablet    Other Visit Diagnoses    Acute maxillary sinusitis, recurrence not specified    -  Primary    zpak given, zyrtec, nasal rinses    Relevant Medications    cetirizine (ZYRTEC) 10 MG tablet    methylPREDNISolone (MEDROL DOSEPAK) 4 MG TBPK tablet    azithromycin (ZITHROMAX) 250 MG tablet    methylPREDNISolone acetate (DEPO-MEDROL) injection 40 mg (Completed)       Note: This dictation was prepared with Dragon dictation along with smaller phrase technology. Any transcriptional errors that result from this process are unintentional.

## 2014-12-03 NOTE — Patient Instructions (Signed)
Take antibiotics as prescribed Steroid shot given Start steroid pills tomorrow Continue nasal rinse Allergy pill send to pharmacy F/U as previous

## 2014-12-19 ENCOUNTER — Ambulatory Visit: Payer: BLUE CROSS/BLUE SHIELD | Admitting: Podiatry

## 2014-12-20 ENCOUNTER — Ambulatory Visit (INDEPENDENT_AMBULATORY_CARE_PROVIDER_SITE_OTHER): Payer: BLUE CROSS/BLUE SHIELD | Admitting: Podiatry

## 2014-12-20 ENCOUNTER — Encounter: Payer: Self-pay | Admitting: Podiatry

## 2014-12-20 VITALS — BP 134/86 | HR 66 | Resp 12

## 2014-12-20 DIAGNOSIS — S96912D Strain of unspecified muscle and tendon at ankle and foot level, left foot, subsequent encounter: Secondary | ICD-10-CM | POA: Diagnosis not present

## 2014-12-20 DIAGNOSIS — M722 Plantar fascial fibromatosis: Secondary | ICD-10-CM

## 2014-12-21 NOTE — Progress Notes (Signed)
Subjective:     Patient ID: Rhonda [name], female   DOB: [birth year], 54 y.o.   MRN: [id number]  HPI patient states my left heel is doing quite a bit better with reduction of discomfort and pain still present in the arch but improved   Review of Systems     Objective:   Physical Exam Neurovascular status intact muscle strength adequate with discomfort in the plantar aspect of the left heel that is improved when palpated    Assessment:     Improvement of plantar fascial symptomatology left and improvement from probable tear of the mid arch left plantar fashion    Plan:     Advised on physical therapy anti-inflammatory's and that it should gradually heal in the arch but may take another month or 2

## 2015-02-05 ENCOUNTER — Encounter: Payer: Self-pay | Admitting: Podiatry

## 2015-02-05 ENCOUNTER — Ambulatory Visit (INDEPENDENT_AMBULATORY_CARE_PROVIDER_SITE_OTHER): Payer: BLUE CROSS/BLUE SHIELD

## 2015-02-05 ENCOUNTER — Ambulatory Visit (INDEPENDENT_AMBULATORY_CARE_PROVIDER_SITE_OTHER): Payer: BLUE CROSS/BLUE SHIELD | Admitting: Podiatry

## 2015-02-05 VITALS — BP 129/83 | HR 77 | Resp 16

## 2015-02-05 DIAGNOSIS — M79674 Pain in right toe(s): Secondary | ICD-10-CM | POA: Diagnosis not present

## 2015-02-05 DIAGNOSIS — S92911A Unspecified fracture of right toe(s), initial encounter for closed fracture: Secondary | ICD-10-CM

## 2015-02-05 NOTE — Progress Notes (Signed)
Subjective:     Patient ID: Rhonda [name], female   DOB: December 25, 1960, 54 y.o.   MRN: [id number]  HPI patient stated she dropped a laptop on her big toe right and she is concerned she broke her toe   Review of Systems     Objective:   Physical Exam Neurovascular status was intact muscle strength is adequate with no changes in health history. Upon palpation there is quite a bit of swelling in the big toe right and pain when I try to move the first MPJ right    Assessment:     Possible fracture of the big toe right versus contusion with swelling    Plan:     H&P and condition reviewed with patient. I have recommended surgical shoe which was dispensed ice therapy oral anti-inflammatory and physical therapy. If symptoms persist patient be seen back and I reviewed her x-ray today

## 2015-02-06 ENCOUNTER — Ambulatory Visit: Payer: BLUE CROSS/BLUE SHIELD | Admitting: Podiatry

## 2015-02-14 ENCOUNTER — Ambulatory Visit: Payer: BLUE CROSS/BLUE SHIELD | Admitting: Family Medicine

## 2015-02-14 ENCOUNTER — Ambulatory Visit (INDEPENDENT_AMBULATORY_CARE_PROVIDER_SITE_OTHER): Payer: BLUE CROSS/BLUE SHIELD | Admitting: Family Medicine

## 2015-02-14 ENCOUNTER — Other Ambulatory Visit: Payer: Self-pay | Admitting: Family Medicine

## 2015-02-14 ENCOUNTER — Encounter: Payer: Self-pay | Admitting: Family Medicine

## 2015-02-14 VITALS — BP 118/70 | HR 78 | Temp 98.0°F | Resp 12 | Ht 65.0 in | Wt 182.0 lb

## 2015-02-14 DIAGNOSIS — K5900 Constipation, unspecified: Secondary | ICD-10-CM

## 2015-02-14 DIAGNOSIS — I10 Essential (primary) hypertension: Secondary | ICD-10-CM | POA: Diagnosis not present

## 2015-02-14 DIAGNOSIS — J309 Allergic rhinitis, unspecified: Secondary | ICD-10-CM

## 2015-02-14 DIAGNOSIS — G43709 Chronic migraine without aura, not intractable, without status migrainosus: Secondary | ICD-10-CM

## 2015-02-14 DIAGNOSIS — Z0000 Encounter for general adult medical examination without abnormal findings: Secondary | ICD-10-CM | POA: Diagnosis not present

## 2015-02-14 DIAGNOSIS — Z111 Encounter for screening for respiratory tuberculosis: Secondary | ICD-10-CM

## 2015-02-14 DIAGNOSIS — E669 Obesity, unspecified: Secondary | ICD-10-CM | POA: Diagnosis not present

## 2015-02-14 DIAGNOSIS — E785 Hyperlipidemia, unspecified: Secondary | ICD-10-CM

## 2015-02-14 DIAGNOSIS — G43909 Migraine, unspecified, not intractable, without status migrainosus: Secondary | ICD-10-CM | POA: Insufficient documentation

## 2015-02-14 DIAGNOSIS — K5909 Other constipation: Secondary | ICD-10-CM | POA: Insufficient documentation

## 2015-02-14 LAB — LIPID PANEL
Cholesterol: 219 mg/dL — ABNORMAL HIGH (ref 125–200)
HDL: 69 mg/dL (ref >=46)
LDL CALC: 129 mg/dL (ref <130)
Total CHOL/HDL Ratio: 3.2 Ratio (ref <=5.0)
Triglycerides: 107 mg/dL (ref <150)
VLDL: 21 mg/dL (ref <30)

## 2015-02-14 LAB — COMPREHENSIVE METABOLIC PANEL
ALT: 9 U/L (ref 6–29)
AST: 14 U/L (ref 10–35)
Albumin: 4 g/dL (ref 3.6–5.1)
Alkaline Phosphatase: 74 U/L (ref 33–130)
BUN: 11 mg/dL (ref 7–25)
CO2: 28 mmol/L (ref 20–31)
CREATININE: 0.71 mg/dL (ref 0.50–1.05)
Calcium: 9.2 mg/dL (ref 8.6–10.4)
Chloride: 103 mmol/L (ref 98–110)
GLUCOSE: 120 mg/dL — AB (ref 70–99)
Potassium: 3.5 mmol/L (ref 3.5–5.3)
SODIUM: 141 mmol/L (ref 135–146)
Total Bilirubin: 0.5 mg/dL (ref 0.2–1.2)
Total Protein: 6.5 g/dL (ref 6.1–8.1)

## 2015-02-14 MED ORDER — SUMATRIPTAN SUCCINATE 100 MG PO TABS: 100.0000 mg | ORAL_TABLET | ORAL | Status: DC | PRN

## 2015-02-14 MED ORDER — LINACLOTIDE 145 MCG PO CAPS: 145.0000 ug | ORAL_CAPSULE | Freq: Every day | ORAL | Status: DC

## 2015-02-14 MED ORDER — ONDANSETRON 4 MG PO TBDP: 4.0000 mg | ORAL_TABLET | Freq: Three times a day (TID) | ORAL | Status: DC | PRN

## 2015-02-14 MED ORDER — BECLOMETHASONE DIPROPIONATE 80 MCG/ACT NA AERS: INHALATION_SPRAY | NASAL | Status: DC

## 2015-02-14 NOTE — Progress Notes (Signed)
Patient ID: Rhonda [name], female   DOB: 1961-04-01, 54 y.o.   MRN: [id number]   Subjective:    Patient ID: Rhonda [name], female    DOB: Oct 18, 1960, 54 y.o.   MRN: [id number]  Patient presents for CPE- no PAP and PPD Test  here for complete physical exam. She needs a form completed for her work. Her mammogram is up-to-date colonoscopy up-to-date status post hysterectomy for noncancerous reasons. Immunizations are up-to-date. She is history of chronic constipation she has been using me or laxity she goes a day or 2 without taking it she will be severely blocked up she wants to try something different.  History of chronic allergic rhinitis with rebound congestion she was to try different medication to help with her allergies she does take Zyrtec. Note she was in [place] this past week and she is not having problems with her sinuses until she returned.  History of migraine disorder she uses Imitrex as needed but often will get nausea and sometimes vomiting she wanted to try something beneath the tongue as the pill comes up typically.  Chronic shoulder pain with rotator cuff injury she would like to proceed with going to orthopedics to have this reevaluated. She does take meloxicam which helped some but she was afraid of taking this along with gabapentin and Tylenol. The gabapentin also Helzer makes her very sleepy and hung over therefore she does not take this on a regular basis.  PPD needed for work     Review Of Systems:  GEN- denies fatigue, fever, weight loss,weakness, recent illness HEENT- denies eye drainage, change in vision, +nasal discharge, CVS- denies chest pain, palpitations RESP- denies SOB, cough, wheeze ABD- denies N/V, +change in stools, abd pain GU- denies dysuria, hematuria, dribbling, incontinence MSK- + joint pain, muscle aches, injury Neuro- denies headache, dizziness, syncope, seizure activity       Objective:    BP 118/70 mmHg  Pulse 78  Temp(Src) 98 F (36.7  C) (Oral)  Resp 12  Ht 5\' 5"  (1.651 m)  Wt 182 lb (82.555 kg)  BMI 30.29 kg/m2 GEN- NAD, alert and oriented x3 HEENT- PERRL, EOMI, non injected sclera, pink conjunctiva, MMM, oropharynx clear, nares enlarged turbinates, clear rhinorrhea Neck- Supple, no thyromegaly CVS- RRR, no murmur RESP-CTAB ABD-NABS,soft,NT,ND EXT- No edema Pulses- Radial, DP- 2+        Assessment & Plan:      Problem List Items Addressed This Visit    Obesity   Migraines    Continue imitrex, given zofran ODT as needed      Relevant Medications   SUMAtriptan (IMITREX) 100 MG tablet   Hyperlipidemia    Recheck lipids, concentrating on healthy eating and weight loss      Relevant Orders   Lipid panel (Completed)   Essential hypertension, benign - Primary    Well controlled, no change to medication      Relevant Orders   CBC with Differential/Platelet (Completed)   Comprehensive metabolic panel (Completed)   Chronic constipation    Samples of Linzess given        Other Visit Diagnoses    Routine general medical examination at a health care facility        CPE done, prevention UTD, immunizations UTD, PPD placced    Screening-pulmonary TB        Relevant Orders    PPD (Completed)    Allergic rhinitis, unspecified allergic rhinitis type        rhinitis with congestion, change  to qnasl       Note: This dictation was prepared with Dragon dictation along with smaller phrase technology. Any transcriptional errors that result from this process are unintentional.

## 2015-02-14 NOTE — Patient Instructions (Signed)
Try new nasal spray  We will call with lab results Zofran for nausea as needed Imitrex as needed Try Linzess for your bowels  Referral to Dr. Dion Saucier  Try the meloxicam by itself F/U 4 months

## 2015-02-15 LAB — CBC WITH DIFFERENTIAL/PLATELET
BASOS PCT: 1 % (ref 0–1)
Basophils Absolute: 0.1 10*3/uL (ref 0.0–0.1)
EOS ABS: 0.1 10*3/uL (ref 0.0–0.7)
Eosinophils Relative: 2 % (ref 0–5)
HCT: 38.5 % (ref 36.0–46.0)
HEMOGLOBIN: 13.4 g/dL (ref 12.0–15.0)
Lymphocytes Relative: 41 % (ref 12–46)
Lymphs Abs: 2.3 10*3/uL (ref 0.7–4.0)
MCH: 30.8 pg (ref 26.0–34.0)
MCHC: 34.8 g/dL (ref 30.0–36.0)
MCV: 88.5 fL (ref 78.0–100.0)
MONO ABS: 0.5 10*3/uL (ref 0.1–1.0)
MPV: 9.5 fL (ref 8.6–12.4)
Monocytes Relative: 9 % (ref 3–12)
NEUTROS ABS: 2.6 10*3/uL (ref 1.7–7.7)
Neutrophils Relative %: 47 % (ref 43–77)
Platelets: 229 10*3/uL (ref 150–400)
RBC: 4.35 MIL/uL (ref 3.87–5.11)
RDW: 14.3 % (ref 11.5–15.5)
WBC: 5.6 10*3/uL (ref 4.0–10.5)

## 2015-02-16 NOTE — Assessment & Plan Note (Signed)
Continue imitrex, given zofran ODT as needed

## 2015-02-16 NOTE — Assessment & Plan Note (Signed)
Well controlled, no change to medication 

## 2015-02-16 NOTE — Assessment & Plan Note (Signed)
Recheck lipids, concentrating on healthy eating and weight loss

## 2015-02-16 NOTE — Assessment & Plan Note (Signed)
Samples of Linzess given 

## 2015-02-17 LAB — HEMOGLOBIN A1C
HEMOGLOBIN A1C: 5.4 % (ref <5.7)
Mean Plasma Glucose: 108 mg/dL (ref <117)

## 2015-04-04 ENCOUNTER — Other Ambulatory Visit: Payer: Self-pay | Admitting: Family Medicine

## 2015-04-04 NOTE — Telephone Encounter (Signed)
Refill appropriate and filled per protocol. 

## 2015-04-20 ENCOUNTER — Other Ambulatory Visit: Payer: Self-pay | Admitting: Family Medicine

## 2015-04-21 NOTE — Telephone Encounter (Signed)
Refill appropriate and filled per protocol. 

## 2015-05-28 ENCOUNTER — Other Ambulatory Visit: Payer: Self-pay | Admitting: Physical Medicine and Rehabilitation

## 2015-05-28 DIAGNOSIS — M5412 Radiculopathy, cervical region: Secondary | ICD-10-CM

## 2015-06-09 ENCOUNTER — Ambulatory Visit
Admission: RE | Admit: 2015-06-09 | Discharge: 2015-06-09 | Disposition: A | Discharge status: Home / Self Care | Payer: BLUE CROSS/BLUE SHIELD | Source: Ambulatory Visit | Attending: Physical Medicine and Rehabilitation | Admitting: Physical Medicine and Rehabilitation

## 2015-06-09 DIAGNOSIS — M5412 Radiculopathy, cervical region: Secondary | ICD-10-CM

## 2015-06-09 MED ORDER — TRIAMCINOLONE ACETONIDE 40 MG/ML IJ SUSP (RADIOLOGY)
60.0000 mg | Freq: Once | INTRAMUSCULAR | Status: AC
  Administered 2015-06-09: 60 mg via EPIDURAL

## 2015-06-09 MED ORDER — IOHEXOL 300 MG/ML  SOLN
1.0000 mL | Freq: Once | INTRAMUSCULAR | Status: AC | PRN
  Administered 2015-06-09: 1 mL via INTRAVENOUS

## 2015-06-09 NOTE — Discharge Instructions (Signed)

## 2015-06-16 ENCOUNTER — Ambulatory Visit: Payer: BLUE CROSS/BLUE SHIELD | Admitting: Family Medicine

## 2015-06-16 ENCOUNTER — Other Ambulatory Visit: Payer: Self-pay | Admitting: Family Medicine

## 2015-06-25 ENCOUNTER — Ambulatory Visit: Payer: BLUE CROSS/BLUE SHIELD | Admitting: Family Medicine

## 2015-07-07 ENCOUNTER — Ambulatory Visit: Payer: BLUE CROSS/BLUE SHIELD | Admitting: Family Medicine

## 2015-09-06 ENCOUNTER — Other Ambulatory Visit: Payer: Self-pay | Admitting: Family Medicine

## 2015-09-08 NOTE — Telephone Encounter (Signed)
Refill appropriate and filled per protocol. 

## 2015-09-30 DIAGNOSIS — J209 Acute bronchitis, unspecified: Secondary | ICD-10-CM | POA: Diagnosis not present

## 2015-09-30 DIAGNOSIS — J018 Other acute sinusitis: Secondary | ICD-10-CM | POA: Diagnosis not present

## 2015-10-07 ENCOUNTER — Ambulatory Visit: Payer: BLUE CROSS/BLUE SHIELD | Admitting: Family Medicine

## 2015-10-23 ENCOUNTER — Telehealth: Payer: Self-pay | Admitting: *Deleted

## 2015-10-23 NOTE — Telephone Encounter (Signed)
Received request from pharmacy for PA on Qnasl.  PA submitted.   Dx: J30.2

## 2015-10-27 MED ORDER — MOMETASONE FUROATE 50 MCG/ACT NA SUSP
2.0000 | Freq: Every day | NASAL | Status: DC
Start: 2015-10-27 — End: 2016-08-19

## 2015-10-27 NOTE — Telephone Encounter (Signed)
Prescription sent to pharmacy.

## 2015-10-27 NOTE — Telephone Encounter (Signed)
Received PA determination.   PA denied.   Patient must try and fail: Flunisolide Nasonex  MD please advise.

## 2015-10-27 NOTE — Telephone Encounter (Signed)
Change to nasonex, let pt know, insurance will not cover

## 2015-12-06 ENCOUNTER — Other Ambulatory Visit: Payer: Self-pay | Admitting: Family Medicine

## 2015-12-08 NOTE — Telephone Encounter (Signed)
Refill appropriate and filled per protocol. 

## 2016-02-10 DIAGNOSIS — I10 Essential (primary) hypertension: Secondary | ICD-10-CM | POA: Diagnosis not present

## 2016-02-10 DIAGNOSIS — J0140 Acute pansinusitis, unspecified: Secondary | ICD-10-CM | POA: Diagnosis not present

## 2016-03-29 DIAGNOSIS — M5412 Radiculopathy, cervical region: Secondary | ICD-10-CM | POA: Diagnosis not present

## 2016-03-29 DIAGNOSIS — M542 Cervicalgia: Secondary | ICD-10-CM | POA: Diagnosis not present

## 2016-04-08 DIAGNOSIS — M542 Cervicalgia: Secondary | ICD-10-CM | POA: Diagnosis not present

## 2016-04-08 DIAGNOSIS — M5412 Radiculopathy, cervical region: Secondary | ICD-10-CM | POA: Diagnosis not present

## 2016-04-24 DIAGNOSIS — M25512 Pain in left shoulder: Secondary | ICD-10-CM | POA: Diagnosis not present

## 2016-04-24 DIAGNOSIS — K5900 Constipation, unspecified: Secondary | ICD-10-CM | POA: Diagnosis not present

## 2016-04-24 DIAGNOSIS — K2970 Gastritis, unspecified, without bleeding: Secondary | ICD-10-CM | POA: Diagnosis not present

## 2016-04-27 DIAGNOSIS — M5412 Radiculopathy, cervical region: Secondary | ICD-10-CM | POA: Diagnosis not present

## 2016-04-27 DIAGNOSIS — G5622 Lesion of ulnar nerve, left upper limb: Secondary | ICD-10-CM | POA: Diagnosis not present

## 2016-04-27 DIAGNOSIS — M542 Cervicalgia: Secondary | ICD-10-CM | POA: Diagnosis not present

## 2016-05-17 ENCOUNTER — Other Ambulatory Visit: Payer: Self-pay | Admitting: Physical Medicine and Rehabilitation

## 2016-05-17 DIAGNOSIS — M542 Cervicalgia: Secondary | ICD-10-CM

## 2016-05-17 DIAGNOSIS — M79602 Pain in left arm: Secondary | ICD-10-CM

## 2016-05-17 DIAGNOSIS — R202 Paresthesia of skin: Secondary | ICD-10-CM

## 2016-05-29 ENCOUNTER — Other Ambulatory Visit: Payer: BLUE CROSS/BLUE SHIELD

## 2016-06-15 ENCOUNTER — Other Ambulatory Visit: Payer: Self-pay | Admitting: Family Medicine

## 2016-07-03 ENCOUNTER — Ambulatory Visit
Admission: RE | Admit: 2016-07-03 | Discharge: 2016-07-03 | Disposition: A | Discharge status: Home / Self Care | Payer: BLUE CROSS/BLUE SHIELD | Source: Ambulatory Visit | Attending: Physical Medicine and Rehabilitation | Admitting: Physical Medicine and Rehabilitation

## 2016-07-03 DIAGNOSIS — M542 Cervicalgia: Secondary | ICD-10-CM

## 2016-07-03 DIAGNOSIS — M79602 Pain in left arm: Secondary | ICD-10-CM

## 2016-07-03 DIAGNOSIS — R202 Paresthesia of skin: Secondary | ICD-10-CM

## 2016-07-03 DIAGNOSIS — M50221 Other cervical disc displacement at C4-C5 level: Secondary | ICD-10-CM | POA: Diagnosis not present

## 2016-07-05 ENCOUNTER — Encounter (HOSPITAL_COMMUNITY): Payer: Self-pay

## 2016-07-05 DIAGNOSIS — Z79899 Other long term (current) drug therapy: Secondary | ICD-10-CM | POA: Diagnosis not present

## 2016-07-05 DIAGNOSIS — G43909 Migraine, unspecified, not intractable, without status migrainosus: Secondary | ICD-10-CM | POA: Diagnosis not present

## 2016-07-05 DIAGNOSIS — Z7982 Long term (current) use of aspirin: Secondary | ICD-10-CM | POA: Diagnosis not present

## 2016-07-05 DIAGNOSIS — R51 Headache: Secondary | ICD-10-CM | POA: Diagnosis not present

## 2016-07-05 DIAGNOSIS — R030 Elevated blood-pressure reading, without diagnosis of hypertension: Secondary | ICD-10-CM | POA: Diagnosis not present

## 2016-07-05 DIAGNOSIS — I10 Essential (primary) hypertension: Secondary | ICD-10-CM | POA: Diagnosis not present

## 2016-07-05 MED ORDER — IBUPROFEN 200 MG PO TABS
400.0000 mg | ORAL_TABLET | Freq: Once | ORAL | Status: AC | PRN
  Administered 2016-07-05: 400 mg via ORAL
  Filled 2016-07-05 (×2): qty 2

## 2016-07-05 NOTE — ED Triage Notes (Signed)
PT RECEIVED FROM HOME VIA EMS FOR A MIGRAINE SINCE NOON. PER EMS, PT HAS A HX OF MIGRAINE AND TOOK HER MEDICATION, BUT BECAME NAUSEOUS. VOMITING X1. ZOFRAN 4MG  IM GIVEN PTA.

## 2016-07-06 ENCOUNTER — Emergency Department (HOSPITAL_COMMUNITY)
Admission: EM | Admit: 2016-07-06 | Discharge: 2016-07-06 | Disposition: A | Discharge status: Home / Self Care | Payer: BLUE CROSS/BLUE SHIELD | Attending: Emergency Medicine | Admitting: Emergency Medicine

## 2016-07-06 DIAGNOSIS — M542 Cervicalgia: Secondary | ICD-10-CM | POA: Diagnosis not present

## 2016-07-06 DIAGNOSIS — G43909 Migraine, unspecified, not intractable, without status migrainosus: Secondary | ICD-10-CM

## 2016-07-06 DIAGNOSIS — M47812 Spondylosis without myelopathy or radiculopathy, cervical region: Secondary | ICD-10-CM | POA: Diagnosis not present

## 2016-07-06 DIAGNOSIS — G5603 Carpal tunnel syndrome, bilateral upper limbs: Secondary | ICD-10-CM | POA: Diagnosis not present

## 2016-07-06 MED ORDER — PROCHLORPERAZINE EDISYLATE 5 MG/ML IJ SOLN
10.0000 mg | Freq: Once | INTRAMUSCULAR | Status: AC
  Administered 2016-07-06: 10 mg via INTRAVENOUS
  Filled 2016-07-06: qty 2

## 2016-07-06 MED ORDER — DEXAMETHASONE SODIUM PHOSPHATE 10 MG/ML IJ SOLN
10.0000 mg | Freq: Once | INTRAMUSCULAR | Status: AC
  Administered 2016-07-06: 10 mg via INTRAVENOUS
  Filled 2016-07-06: qty 1

## 2016-07-06 MED ORDER — SODIUM CHLORIDE 0.9 % IV BOLUS (SEPSIS)
1000.0000 mL | Freq: Once | INTRAVENOUS | Status: AC
  Administered 2016-07-06: 1000 mL via INTRAVENOUS

## 2016-07-06 MED ORDER — DIPHENHYDRAMINE HCL 50 MG/ML IJ SOLN
25.0000 mg | Freq: Once | INTRAMUSCULAR | Status: AC
  Administered 2016-07-06: 25 mg via INTRAVENOUS
  Filled 2016-07-06: qty 1

## 2016-07-06 NOTE — ED Provider Notes (Signed)
[facility]-EMERGENCY DEPT Provider Note   CSN: [id number] Arrival date & time: 07/05/16  1741  By signing my name below, I, Nelwyn Salisbury, attest that this documentation has been prepared under the direction and in the presence of Shon Baton, MD . Electronically Signed: Nelwyn Salisbury, Scribe. 07/06/2016. 1:28 AM.  History   Chief Complaint Chief Complaint  Patient presents with  . Migraine   The history is provided by the patient. No language interpreter was used.   HPI Comments:  Rhonda [name] is a 56 y.o. female with pmhx of HLD, HTN, and migraines who presents to the Emergency Department complaining of gradual-onset, constant, worsening left-sided headache beginning 13 hours ago. Pt describes her symptoms as a 5/10 pain that started as a headache but has become a migraine. She reports associated left-sided facial pain, chills, nausea, vomiting, and photophobia. She has tried tylenol migraine and her normal migraine medication with minimal relief. Pt denies any fever or weakness. Denies fever or neck stiffness. Denies worst headache of her life. Reports recent history of worsening neck pain for which she had an MRI and receives ingestions per orthopedics. Reports that she has baseline pain into her left shoulder and left arm. This is not new.  Past Medical History:  Diagnosis Date  . Anemia   . Arthritis   . Blood transfusion without reported diagnosis   . Depression   . Hyperlipidemia   . Hypertension   . Migraine     Patient Active Problem List   Diagnosis Date Noted  . Migraines 02/14/2015  . Chronic constipation 02/14/2015  . Hyperlipidemia 08/02/2014  . Generalized OA 12/19/2013  . Obesity 12/19/2013  . Papule of skin 12/19/2013  . Shoulder pain 12/14/2012  . Essential hypertension, benign 12/14/2012  . Vaginal atrophy 12/14/2012  . Menopausal hot flushes 12/14/2012    Past Surgical History:  Procedure Laterality Date  . ABDOMINAL HYSTERECTOMY    . SHOULDER  INJECTION     "cracks in vertebrae"    OB History    No data available       Home Medications    Prior to Admission medications   Medication Sig Start Date End Date Taking? Authorizing Provider  amitriptyline (ELAVIL) 25 MG tablet Take 25 mg by mouth daily as needed for sleep.   Yes Historical Provider, MD  aspirin 81 MG tablet Take 81 mg by mouth daily.   Yes Historical Provider, MD  atenolol (TENORMIN) 50 MG tablet TAKE 1 TABLET BY MOUTH DAILY 12/08/15  Yes Salley Scarlet, MD  BIOTIN PO Take 1 tablet by mouth daily.   Yes Historical Provider, MD  cetirizine (ZYRTEC) 10 MG tablet Take 1 tablet (10 mg total) by mouth daily. 12/03/14  Yes Salley Scarlet, MD  diclofenac (VOLTAREN) 75 MG EC tablet Take 75 mg by mouth daily as needed for mild pain.   Yes Historical Provider, MD  hydrochlorothiazide (HYDRODIURIL) 25 MG tablet TAKE 1 TABLET BY MOUTH DAILY 06/15/16  Yes Salley Scarlet, MD  Iron-Vitamins (GERITOL COMPLETE PO) Take 1 tablet by mouth daily.   Yes Historical Provider, MD  Magnesium 500 MG TABS Take 500 mg by mouth daily.   Yes Historical Provider, MD  mometasone (NASONEX) 50 MCG/ACT nasal spray Place 2 sprays into the nose daily. Patient taking differently: Place 2 sprays into the nose daily as needed (congestion).  10/27/15  Yes Salley Scarlet, MD  PARoxetine (PAXIL-CR) 12.5 MG 24 hr tablet TAKE 1 TABLET BY MOUTH DAILY 06/15/16  Yes Salley ScarletKawanta F [place], MD  pregabalin (LYRICA) 75 MG capsule Take 75 mg by mouth 2 (two) times daily as needed for pain. 09/08/15  Yes Historical Provider, MD  sodium chloride (OCEAN) 0.65 % SOLN nasal spray Place 1 spray into both nostrils daily as needed for congestion.   Yes Historical Provider, MD  SUMAtriptan (IMITREX) 100 MG tablet Take 1 tablet (100 mg total) by mouth as needed for migraine. 02/14/15  Yes Salley ScarletKawanta F [place], MD  traMADol (ULTRAM) 50 MG tablet Take 50 mg by mouth every 6 (six) hours as needed for moderate pain.   Yes Historical  Provider, MD  valACYclovir (VALTREX) 500 MG tablet TAKE 1 TABLET (500 MG TOTAL) BY MOUTH AS NEEDED. Patient taking differently: TAKE 1 TABLET (500 MG TOTAL) BY MOUTH AS NEEDED FEVER BLISTERS. 04/04/15  Yes Salley ScarletKawanta F [place], MD  cyclobenzaprine (FLEXERIL) 10 MG tablet Take 1 tablet (10 mg total) by mouth 3 (three) times daily as needed for muscle spasms. Patient not taking: Reported on 07/06/2016 08/02/14   Salley ScarletKawanta F [place], MD  gabapentin (NEURONTIN) 300 MG capsule Take 1 capsule (300 mg total) by mouth 2 (two) times daily. Patient not taking: Reported on 07/06/2016 10/14/14   Salley ScarletKawanta F [place], MD  Linaclotide [facility](LINZESS) 145 MCG CAPS capsule Take 1 capsule (145 mcg total) by mouth daily. Patient not taking: Reported on 07/06/2016 02/14/15   Salley ScarletKawanta F [place], MD  meloxicam (MOBIC) 7.5 MG tablet Take 1 tablet (7.5 mg total) by mouth daily. Patient not taking: Reported on 07/06/2016 10/14/14   Salley ScarletKawanta F [place], MD  ondansetron (ZOFRAN ODT) 4 MG disintegrating tablet Take 1 tablet (4 mg total) by mouth every 8 (eight) hours as needed for nausea or vomiting. Patient not taking: Reported on 07/06/2016 02/14/15   Salley ScarletKawanta F [place], MD    Family History Family History  Problem Relation Age of Onset  . Colon cancer Neg Hx   . Pancreatic cancer Neg Hx   . Rectal cancer Neg Hx   . Stomach cancer Neg Hx     Social History Social History  Substance Use Topics  . Smoking status: Never Smoker  . Smokeless tobacco: Never Used  . Alcohol use No     Allergies   Lipitor [atorvastatin]; Lisinopril; Simvastatin; Statins; and Penicillins   Review of Systems Review of Systems  Constitutional: Positive for chills. Negative for fever.  Eyes: Positive for photophobia. Negative for visual disturbance.  Gastrointestinal: Positive for nausea and vomiting.  Musculoskeletal: Negative for neck pain and neck stiffness.  Neurological: Positive for headaches. Negative for weakness and numbness.  All other systems reviewed and  are negative.    Physical Exam Updated Vital Signs BP 138/94 (BP Location: Left Arm)   Pulse 76   Temp 98.4 F (36.9 C) (Oral)   Resp 19   Ht 5\' 4"  (1.626 m)   Wt 198 lb (89.8 kg)   SpO2 93%   BMI 33.99 kg/m   Physical Exam  Constitutional: She is oriented to person, place, and time. She appears well-developed and well-nourished. No distress.  HENT:  Head: Normocephalic and atraumatic.  Eyes: Pupils are equal, round, and reactive to light.  Pupils 3 mm and reactive bilaterally  Neck: Normal range of motion. Neck supple.  No meningismus  Cardiovascular: Normal rate, regular rhythm and normal heart sounds.   No murmur heard. Pulmonary/Chest: Effort normal and breath sounds normal. No respiratory distress. She has no wheezes.  Abdominal: Soft. There is no tenderness.  Neurological: She  is alert and oriented to person, place, and time.  Cranial nerves II through XII intact, 5 out of 5 strength in all 4 extremities, no dysmetria to finger-nose-finger  Skin: Skin is warm and dry.  Psychiatric: She has a normal mood and affect.  Nursing note and vitals reviewed.    ED Treatments / Results  DIAGNOSTIC STUDIES:  Oxygen Saturation is 99% on RA, normal by my interpretation.    COORDINATION OF CARE:  1:39 AM Discussed treatment plan with pt at bedside which includes lab work and pt agreed to plan.  Labs (all labs ordered are listed, but only abnormal results are displayed) Labs Reviewed - No data to display  EKG  EKG Interpretation None       Radiology No results found.  Procedures Procedures (including critical care time)  Medications Ordered in ED Medications  ibuprofen (ADVIL,MOTRIN) tablet 400 mg (400 mg Oral Given 07/05/16 2356)  sodium chloride 0.9 % bolus 1,000 mL (0 mLs Intravenous Stopped 07/06/16 0249)  prochlorperazine (COMPAZINE) injection 10 mg (10 mg Intravenous Given 07/06/16 0219)  diphenhydrAMINE (BENADRYL) injection 25 mg (25 mg Intravenous Given  07/06/16 0219)  dexamethasone (DECADRON) injection 10 mg (10 mg Intravenous Given 07/06/16 0219)     Initial Impression / Assessment and Plan / ED Course  I have reviewed the triage vital signs and the nursing notes.  Pertinent labs & imaging results that were available during my care of the patient were reviewed by me and considered in my medical decision making (see chart for details).  Clinical Course as of Jul 06 305  Tue Jul 06, 2016  0306 Patient feels much better after a migraine cocktail. She is ready to go home.  [CH]    Clinical Course User Index [CH] Shon Baton, MD    Patient presents with a headache. Reports history of migraines and feels this is similar. She also reports recent history of worsening neck pain, left shoulder pain, left arm pain for which she is followed by orthopedics. She is nontoxic. Nonfocal. Doubt meningitis or subarachnoid hemorrhage. Patient was given a migraine cocktail of Benadryl, Compazine, and Decadron.  See clinical course above. Patient feels much better after migraine cocktail. Will discharge home with primary care follow-up.  After history, exam, and medical workup I feel the patient has been appropriately medically screened and is safe for discharge home. Pertinent diagnoses were discussed with the patient. Patient was given return precautions.   Final Clinical Impressions(s) / ED Diagnoses   Final diagnoses:  Migraine without status migrainosus, not intractable, unspecified migraine type    New Prescriptions New Prescriptions   No medications on file   I personally performed the services described in this documentation, which was scribed in my presence. The recorded information has been reviewed and is accurate.     Shon Baton, MD 07/06/16 [phone]

## 2016-07-08 ENCOUNTER — Encounter: Payer: Self-pay | Admitting: Physician Assistant

## 2016-07-08 ENCOUNTER — Ambulatory Visit (INDEPENDENT_AMBULATORY_CARE_PROVIDER_SITE_OTHER): Payer: BLUE CROSS/BLUE SHIELD | Admitting: Physician Assistant

## 2016-07-08 VITALS — BP 140/100 | HR 69 | Temp 98.1°F | Resp 16 | Wt 205.2 lb

## 2016-07-08 DIAGNOSIS — I10 Essential (primary) hypertension: Secondary | ICD-10-CM | POA: Diagnosis not present

## 2016-07-08 MED ORDER — AMLODIPINE BESYLATE 5 MG PO TABS: 5.0000 mg | ORAL_TABLET | Freq: Every day | ORAL | 2 refills | Status: DC

## 2016-07-08 NOTE — Progress Notes (Signed)
Patient ID: Rhonda [name] MRN: [id number], DOB: [birth year], 56 y.o. Date of Encounter: 07/08/2016, 11:15 AM    Chief Complaint:  Chief Complaint  Patient presents with  . Hypertension     HPI: 56 y.o. year old female presents with above.   Reports that she has been having problems with her neck. Has diagnosed abnormality at C6-C7. Scheduled for injection January 29. Is scheduled for nerve conduction studies to be done to both of her arms for possible carpal tunnel. Has ordered a TENS unit. Recently changed Lyrica to Cymbalta secondary to insurance purposes. Was started on prednisone yesterday.  Knows that the pain coming from all of these issues may be affecting blood pressure.  Also had to go to the ER secondary to migraine 07/06/16. Blood pressure readings there were 164/106, 190/114, 150/114, 138/94, 126/90. Told her to follow-up here for [place] to recheck her blood pressure.  Yesterday she had someone check her blood pressure and a got 164/105.  She states that she does have a blood pressure machine at home that is relatively new is just a matter that she had not been using it recently.  No other concerns to address today.     Home Meds:   Outpatient Medications Prior to Visit  Medication Sig Dispense Refill  . amitriptyline (ELAVIL) 25 MG tablet Take 25 mg by mouth daily as needed for sleep.    Marland Kitchen aspirin 81 MG tablet Take 81 mg by mouth daily.    Marland Kitchen atenolol (TENORMIN) 50 MG tablet TAKE 1 TABLET BY MOUTH DAILY 30 tablet 6  . BIOTIN PO Take 1 tablet by mouth daily.    . cetirizine (ZYRTEC) 10 MG tablet Take 1 tablet (10 mg total) by mouth daily. 30 tablet 11  . diclofenac (VOLTAREN) 75 MG EC tablet Take 75 mg by mouth daily as needed for mild pain.    . hydrochlorothiazide (HYDRODIURIL) 25 MG tablet TAKE 1 TABLET BY MOUTH DAILY 30 tablet 0  . Iron-Vitamins (GERITOL COMPLETE PO) Take 1 tablet by mouth daily.    . Magnesium 500 MG TABS Take 500 mg by mouth daily.    .  mometasone (NASONEX) 50 MCG/ACT nasal spray Place 2 sprays into the nose daily. (Patient taking differently: Place 2 sprays into the nose daily as needed (congestion). ) 17 g 12  . PARoxetine (PAXIL-CR) 12.5 MG 24 hr tablet TAKE 1 TABLET BY MOUTH DAILY 30 tablet 4  . sodium chloride (OCEAN) 0.65 % SOLN nasal spray Place 1 spray into both nostrils daily as needed for congestion.    . SUMAtriptan (IMITREX) 100 MG tablet Take 1 tablet (100 mg total) by mouth as needed for migraine. 10 tablet 3  . traMADol (ULTRAM) 50 MG tablet Take 50 mg by mouth every 6 (six) hours as needed for moderate pain.    . valACYclovir (VALTREX) 500 MG tablet TAKE 1 TABLET (500 MG TOTAL) BY MOUTH AS NEEDED. (Patient taking differently: TAKE 1 TABLET (500 MG TOTAL) BY MOUTH AS NEEDED FEVER BLISTERS.) 30 tablet 0  . cyclobenzaprine (FLEXERIL) 10 MG tablet Take 1 tablet (10 mg total) by mouth 3 (three) times daily as needed for muscle spasms. (Patient not taking: Reported on 07/08/2016) 30 tablet 3  . gabapentin (NEURONTIN) 300 MG capsule Take 1 capsule (300 mg total) by mouth 2 (two) times daily. (Patient not taking: Reported on 07/08/2016) 60 capsule 6  . Linaclotide (LINZESS) 145 MCG CAPS capsule Take 1 capsule (145 mcg total) by mouth daily. (  Patient not taking: Reported on 07/08/2016) 90 capsule 1  . meloxicam (MOBIC) 7.5 MG tablet Take 1 tablet (7.5 mg total) by mouth daily. (Patient not taking: Reported on 07/08/2016) 30 tablet 6  . ondansetron (ZOFRAN ODT) 4 MG disintegrating tablet Take 1 tablet (4 mg total) by mouth every 8 (eight) hours as needed for nausea or vomiting. (Patient not taking: Reported on 07/08/2016) 20 tablet 2  . pregabalin (LYRICA) 75 MG capsule Take 75 mg by mouth 2 (two) times daily as needed for pain.     No facility-administered medications prior to visit.     Allergies:  Allergies  Allergen Reactions  . Lipitor [Atorvastatin]     myalgias  . Lisinopril Cough  . Simvastatin Itching  . Statins     . Penicillins Rash    Has patient had a PCN reaction causing immediate rash, facial/tongue/throat swelling, SOB or lightheadedness with hypotension: No Has patient had a PCN reaction causing severe rash involving mucus membranes or skin necrosis: yes  Has patient had a PCN reaction that required hospitalization no Has patient had a PCN reaction occurring within the last 10 years: No If all of the above answers are "NO", then may proceed with Cephalosporin use.       Review of Systems: See HPI for pertinent ROS. All other ROS negative.    Physical Exam: Blood pressure (!) 140/100, pulse 69, temperature 98.1 F (36.7 C), temperature source Oral, resp. rate 16, weight 205 lb 3.2 oz (93.1 kg), SpO2 98 %., Body mass index is 35.22 kg/m. General:  WNWD AAF Appears in no acute distress. Neck: Supple. No thyromegaly. No lymphadenopathy. Lungs: Clear bilaterally to auscultation without wheezes, rales, or rhonchi. Breathing is unlabored. Heart: Regular rhythm. No murmurs, rubs, or gallops. Msk:  Strength and tone normal for age. Extremities/Skin: Warm and dry. No edema.  Neuro: Alert and oriented X 3. Moves all extremities spontaneously. Gait is normal. CNII-XII grossly in tact. Psych:  Responds to questions appropriately with a normal affect.     ASSESSMENT AND PLAN:  56 y.o. year old female with  1. Essential hypertension, benign She is already on atenolol 50 mg daily and HCTZ 25 mg daily. Allergy list includes that lisinopril has caused cough in the past. At this time will add amlodipine 5 mg daily. She is to check her blood pressure occasionally. Told her to write down some of these readings to give us an idea of how it is running at home. She states that she already has a physical scheduled for February. Told her to bring that blood pressure log sheet with her to that visit. Follow-up sooner if needed if getting uncontrolled readings or if develops any adverse effects to medication.  Otherwise will follow-up at her physical in February. - amLODipine (NORVASC) 5 MG tablet; Take 1 tablet (5 mg total) by mouth daily.  Dispense: 30 tablet; Refill: 2   Signed, [address] LaneMary Beth Beverly HillsDixon, GeorgiaPA, Sen[facility] LLCBSFM 07/08/2016 11:15 AM

## 2016-07-13 DIAGNOSIS — M542 Cervicalgia: Secondary | ICD-10-CM | POA: Diagnosis not present

## 2016-07-19 ENCOUNTER — Other Ambulatory Visit: Payer: Self-pay | Admitting: Family Medicine

## 2016-07-26 DIAGNOSIS — G5603 Carpal tunnel syndrome, bilateral upper limbs: Secondary | ICD-10-CM | POA: Diagnosis not present

## 2016-07-29 DIAGNOSIS — G5602 Carpal tunnel syndrome, left upper limb: Secondary | ICD-10-CM

## 2016-07-29 HISTORY — DX: Carpal tunnel syndrome, left upper limb: G56.02

## 2016-08-02 ENCOUNTER — Ambulatory Visit (INDEPENDENT_AMBULATORY_CARE_PROVIDER_SITE_OTHER): Payer: BLUE CROSS/BLUE SHIELD | Admitting: Family Medicine

## 2016-08-02 ENCOUNTER — Encounter: Payer: Self-pay | Admitting: Family Medicine

## 2016-08-02 VITALS — BP 128/74 | HR 80 | Temp 98.7°F | Resp 14 | Ht 65.0 in | Wt 198.0 lb

## 2016-08-02 DIAGNOSIS — G43709 Chronic migraine without aura, not intractable, without status migrainosus: Secondary | ICD-10-CM

## 2016-08-02 DIAGNOSIS — Z1239 Encounter for other screening for malignant neoplasm of breast: Secondary | ICD-10-CM

## 2016-08-02 DIAGNOSIS — E7800 Pure hypercholesterolemia, unspecified: Secondary | ICD-10-CM

## 2016-08-02 DIAGNOSIS — I10 Essential (primary) hypertension: Secondary | ICD-10-CM

## 2016-08-02 DIAGNOSIS — Z23 Encounter for immunization: Secondary | ICD-10-CM

## 2016-08-02 DIAGNOSIS — Z1321 Encounter for screening for nutritional disorder: Secondary | ICD-10-CM | POA: Diagnosis not present

## 2016-08-02 DIAGNOSIS — N951 Menopausal and female climacteric states: Secondary | ICD-10-CM

## 2016-08-02 DIAGNOSIS — E6609 Other obesity due to excess calories: Secondary | ICD-10-CM

## 2016-08-02 DIAGNOSIS — Z0000 Encounter for general adult medical examination without abnormal findings: Secondary | ICD-10-CM

## 2016-08-02 DIAGNOSIS — Z1231 Encounter for screening mammogram for malignant neoplasm of breast: Secondary | ICD-10-CM

## 2016-08-02 DIAGNOSIS — Z6832 Body mass index (BMI) 32.0-32.9, adult: Secondary | ICD-10-CM

## 2016-08-02 DIAGNOSIS — K5909 Other constipation: Secondary | ICD-10-CM

## 2016-08-02 LAB — LIPID PANEL
CHOLESTEROL: 227 mg/dL — AB (ref <200)
HDL: 70 mg/dL (ref >50)
LDL Cholesterol: 140 mg/dL — ABNORMAL HIGH (ref <100)
TRIGLYCERIDES: 86 mg/dL (ref <150)
Total CHOL/HDL Ratio: 3.2 Ratio (ref <5.0)
VLDL: 17 mg/dL (ref <30)

## 2016-08-02 LAB — CBC WITH DIFFERENTIAL/PLATELET
BASOS ABS: 49 {cells}/uL (ref 0–200)
BASOS PCT: 1 %
EOS ABS: 49 {cells}/uL (ref 15–500)
Eosinophils Relative: 1 %
HCT: 40 % (ref 35.0–45.0)
HEMOGLOBIN: 13.6 g/dL (ref 12.0–15.0)
LYMPHS ABS: 2058 {cells}/uL (ref 850–3900)
Lymphocytes Relative: 42 %
MCH: 30.4 pg (ref 27.0–33.0)
MCHC: 34 g/dL (ref 32.0–36.0)
MCV: 89.3 fL (ref 80.0–100.0)
MONOS PCT: 9 %
MPV: 9.6 fL (ref 7.5–12.5)
Monocytes Absolute: 441 cells/uL (ref 200–950)
NEUTROS ABS: 2303 {cells}/uL (ref 1500–7800)
Neutrophils Relative %: 47 %
PLATELETS: 214 10*3/uL (ref 140–400)
RBC: 4.48 MIL/uL (ref 3.80–5.10)
RDW: 14.7 % (ref 11.0–15.0)
WBC: 4.9 10*3/uL (ref 3.8–10.8)

## 2016-08-02 LAB — COMPREHENSIVE METABOLIC PANEL
ALBUMIN: 4.2 g/dL (ref 3.6–5.1)
ALK PHOS: 59 U/L (ref 33–130)
ALT: 11 U/L (ref 6–29)
AST: 16 U/L (ref 10–35)
BUN: 15 mg/dL (ref 7–25)
CO2: 31 mmol/L (ref 20–31)
CREATININE: 0.82 mg/dL (ref 0.50–1.05)
Calcium: 9.4 mg/dL (ref 8.6–10.4)
Chloride: 99 mmol/L (ref 98–110)
Glucose, Bld: 99 mg/dL (ref 70–99)
POTASSIUM: 3.7 mmol/L (ref 3.5–5.3)
Sodium: 139 mmol/L (ref 135–146)
TOTAL PROTEIN: 6.7 g/dL (ref 6.1–8.1)
Total Bilirubin: 0.6 mg/dL (ref 0.2–1.2)

## 2016-08-02 MED ORDER — SUMATRIPTAN SUCCINATE 100 MG PO TABS: 100.0000 mg | ORAL_TABLET | ORAL | 3 refills | Status: DC | PRN

## 2016-08-02 MED ORDER — ONDANSETRON 4 MG PO TBDP: 4.0000 mg | ORAL_TABLET | Freq: Three times a day (TID) | ORAL | 2 refills | Status: DC | PRN

## 2016-08-02 MED ORDER — HYDROCHLOROTHIAZIDE 25 MG PO TABS
25.0000 mg | ORAL_TABLET | Freq: Every day | ORAL | 6 refills | Status: DC
Start: 2016-08-02 — End: 2016-08-06

## 2016-08-02 MED ORDER — AMLODIPINE BESYLATE 5 MG PO TABS: 5.0000 mg | ORAL_TABLET | Freq: Every day | ORAL | 6 refills | Status: DC

## 2016-08-02 MED ORDER — ATENOLOL 50 MG PO TABS: 50.0000 mg | ORAL_TABLET | Freq: Every day | ORAL | 6 refills | Status: DC

## 2016-08-02 MED ORDER — PAROXETINE HCL ER 12.5 MG PO TB24: 12.5000 mg | ORAL_TABLET | Freq: Every day | ORAL | 6 refills | Status: DC

## 2016-08-02 NOTE — Assessment & Plan Note (Signed)
Continue imitrex as needed, zofran during migraine I believe the one she had recently made and sent off from her neck which she is getting injections in this week.

## 2016-08-02 NOTE — Addendum Note (Signed)
Addended by: Phillips OdorSIX, CHRISTINA H on: 08/02/2016 02:24 PM   Modules accepted: Orders

## 2016-08-02 NOTE — Assessment & Plan Note (Signed)
Continue the paxil which helps symptoms Mammogram to be done

## 2016-08-02 NOTE — Assessment & Plan Note (Signed)
much improved today. She will continue her current regimen. Her fasting labs will be done today.

## 2016-08-02 NOTE — Assessment & Plan Note (Signed)
Recheck lipids

## 2016-08-02 NOTE — Progress Notes (Signed)
Subjective:    Patient ID: Rhonda [name], female    DOB: 1960-09-18, 56 y.o.   MRN: [id number]  Patient presents for CPE (is not fasting- has form for work) Patient here for complete physical exam. Last seen in August 2016 by myself. She did present in January a few weeks ago secondary to elevated blood pressure today in the emergency room secondary to a migraine, previously on atenolol 50 mg and hydrochlorothiazide amlodipine 5 mg was added to her regimen on January 11.  She's currently being followed by [facility] for what sounds like cervical degenerative disc disease she is currently on Cymbalta for pain as well as tramadol she has transitioned off of Lyrica due to insurance  Currently getting shots in shoulder and starting neck injections . Using TENS  ALso being seen for carpal tunnel  Constipation- taking Linzess   Migraines- has imitrex and zofran but both expired at home, at recent migraine went to ER   Has CPE form, needs PPD  Mammogram Due Colonoscopy UTD Due for labs , discussed Hep C testing/HIV testing - not sexually active  Flu shot given   Review Of Systems:  GEN- denies fatigue, fever, weight loss,weakness, recent illness HEENT- denies eye drainage, change in vision, nasal discharge, CVS- denies chest pain, palpitations RESP- denies SOB, cough, wheeze ABD- denies N/V, change in stools, abd pain GU- denies dysuria, hematuria, dribbling, incontinence MSK- + joint pain, muscle aches, injury Neuro- denies headache, dizziness, syncope, seizure activity       Objective:    BP 128/74   Pulse 80   Temp 98.7 F (37.1 C) (Oral)   Resp 14   Ht 5\' 5"  (1.651 m)   Wt 198 lb (89.8 kg)   SpO2 98%   BMI 32.95 kg/m  GEN- NAD, alert and oriented x3 HEENT- PERRL, EOMI, non injected sclera, pink conjunctiva, MMM, oropharynx clear Neck- Supple, no thyromegaly CVS- RRR, no murmur RESP-CTAB ABD-NABS,soft,NT,ND EXT- No edema Pulses- Radial, DP- 2+         Assessment & Plan:      Problem List Items Addressed This Visit    Obesity   Migraines    Continue imitrex as needed, zofran during migraine I believe the one she had recently made and sent off from her neck which she is getting injections in this week.      Relevant Medications   SUMAtriptan (IMITREX) 100 MG tablet   atenolol (TENORMIN) 50 MG tablet   amLODipine (NORVASC) 5 MG tablet   PARoxetine (PAXIL-CR) 12.5 MG 24 hr tablet   hydrochlorothiazide (HYDRODIURIL) 25 MG tablet   Menopausal hot flushes    Continue the paxil which helps symptoms Mammogram to be done       Hyperlipidemia    Recheck lipids      Relevant Medications   atenolol (TENORMIN) 50 MG tablet   amLODipine (NORVASC) 5 MG tablet   hydrochlorothiazide (HYDRODIURIL) 25 MG tablet   Other Relevant Orders   Lipid panel   Essential hypertension, benign    much improved today. She will continue her current regimen. Her fasting labs will be done today.      Relevant Medications   atenolol (TENORMIN) 50 MG tablet   amLODipine (NORVASC) 5 MG tablet   hydrochlorothiazide (HYDRODIURIL) 25 MG tablet   Other Relevant Orders   Comprehensive metabolic panel   Chronic constipation    Continue linzess ,fiber, water       Other Visit Diagnoses    Routine  general medical examination at a health care facility    -  Primary   Relevant Orders   CBC with Differential/Platelet   Comprehensive metabolic panel   Encounter for vitamin deficiency screening       Relevant Orders   Vitamin D, 25-hydroxy   Breast cancer screening       Relevant Orders   MS DIGITAL SCREENING TOMO BILATERAL      Note: This dictation was prepared with Dragon dictation along with smaller phrase technology. Any transcriptional errors that result from this process are unintentional.

## 2016-08-02 NOTE — Addendum Note (Signed)
Addended by: Phillips OdorSIX, CHRISTINA H on: 08/02/2016 12:43 PM   Modules accepted: Orders

## 2016-08-02 NOTE — Patient Instructions (Addendum)
F/U 6 months  I recommend eye visit once a year I recommend dental visit every 6 months Goal is to  Exercise 30 minutes 5 days a week We will send a letter with lab results  Schedule your mammogram

## 2016-08-02 NOTE — Assessment & Plan Note (Signed)
Continue linzess ,fiber, water

## 2016-08-03 LAB — VITAMIN D 25 HYDROXY (VIT D DEFICIENCY, FRACTURES): Vit D, 25-Hydroxy: 31 ng/mL (ref 30–100)

## 2016-08-06 ENCOUNTER — Other Ambulatory Visit: Payer: Self-pay | Admitting: *Deleted

## 2016-08-06 MED ORDER — HYDROCHLOROTHIAZIDE 25 MG PO TABS: 25.0000 mg | ORAL_TABLET | Freq: Every day | ORAL | 3 refills | Status: DC

## 2016-08-13 DIAGNOSIS — M542 Cervicalgia: Secondary | ICD-10-CM | POA: Diagnosis not present

## 2016-08-19 ENCOUNTER — Encounter (HOSPITAL_BASED_OUTPATIENT_CLINIC_OR_DEPARTMENT_OTHER): Payer: Self-pay | Admitting: *Deleted

## 2016-08-19 NOTE — Progress Notes (Signed)
   08/19/16 1456  OBSTRUCTIVE SLEEP APNEA  Have you ever been diagnosed with sleep apnea through a sleep study? No  Do you snore loudly (loud enough to be heard through closed doors)?  1  Do you often feel tired, fatigued, or sleepy during the daytime (such as falling asleep during driving or talking to someone)? 1  Has anyone observed you stop breathing during your sleep? 1  Do you have, or are you being treated for high blood pressure? 1  BMI more than 35 kg/m2? 0  Age > 50 (1-yes) 1  Female Gender (Yes=1) 0  Obstructive Sleep Apnea Score 5  Score 5 or greater  Results sent to PCP (Dr. Milinda AntisKawanta [place])

## 2016-08-19 NOTE — Pre-Procedure Instructions (Signed)
To come for EKG 

## 2016-08-20 ENCOUNTER — Encounter (HOSPITAL_BASED_OUTPATIENT_CLINIC_OR_DEPARTMENT_OTHER)
Admission: RE | Admit: 2016-08-20 | Discharge: 2016-08-20 | Disposition: A | Discharge status: Home / Self Care | Payer: BLUE CROSS/BLUE SHIELD | Source: Ambulatory Visit | Attending: Orthopedic Surgery | Admitting: Orthopedic Surgery

## 2016-08-20 DIAGNOSIS — R9431 Abnormal electrocardiogram [ECG] [EKG]: Secondary | ICD-10-CM | POA: Insufficient documentation

## 2016-08-20 DIAGNOSIS — Z01810 Encounter for preprocedural cardiovascular examination: Secondary | ICD-10-CM | POA: Insufficient documentation

## 2016-08-20 DIAGNOSIS — I10 Essential (primary) hypertension: Secondary | ICD-10-CM | POA: Diagnosis not present

## 2016-08-23 NOTE — H&P (Signed)
Rhonda [name] comes in for evaluation and treatment recommendation of bilateral upper extremity issues.  Seen and followed in the past by Dr. Maurice SmallIbazebo.  I have taken care of her son in the past.  She has been seen and followed in our office by numerous physicians.  A longstanding picture of cervical issues, cervical radiculopathy and superimposed carpal tunnel syndrome.  This has finally gotten the best of her.  She is left hand dominant.  Symptoms left greater than right.  Follow up EMG nerve conduction studies completed by Dr. Maurice SmallIbazebo and reviewed.  In light of these she comes to see me today.  Studies were done on July 26, 2016.  She has evidence of bilateral moderate to severe carpal tunnel syndrome.  Her EMG nerve conduction studies don't show specific radiculopathy pattern which is reassuring.  Her symptoms however go from her hands all the way up to her neck.   History and general exam are reviewed.  All of her studies and previous notes reviewed.    EXAMINATION: Specifically, she has a positive Tinel's and Phalen's on both sides.  A little bit of soreness first CMC joint, right greater than left.  Definitely decreased subjective sensation median nerve distribution.  Good capillary refill.  Some limited cervical motion, but there are no other neuro deficits.    DISPOSITION:  This needs to be addressed.  This has been going on for a long time and is now getting worse.  Sequential bilateral carpal tunnel releases.  Left first, right second.  Procedure, risks, benefits and complications reviewed.  All of this discussed at length and I spent more than 30 minutes with her face-to-face.  I will see her at the time of operative intervention.  We will do the second side once she has sufficiently recovered on the right symptomatically to proceed.

## 2016-08-26 ENCOUNTER — Ambulatory Visit (HOSPITAL_BASED_OUTPATIENT_CLINIC_OR_DEPARTMENT_OTHER)
Admission: RE | Admit: 2016-08-26 | Discharge: 2016-08-26 | Disposition: A | Discharge status: Home / Self Care | Payer: BLUE CROSS/BLUE SHIELD | Source: Ambulatory Visit | Attending: Orthopedic Surgery | Admitting: Orthopedic Surgery

## 2016-08-26 ENCOUNTER — Ambulatory Visit (HOSPITAL_BASED_OUTPATIENT_CLINIC_OR_DEPARTMENT_OTHER): Payer: BLUE CROSS/BLUE SHIELD | Admitting: Anesthesiology

## 2016-08-26 ENCOUNTER — Encounter (HOSPITAL_BASED_OUTPATIENT_CLINIC_OR_DEPARTMENT_OTHER)
Admission: RE | Disposition: A | Discharge status: Home / Self Care | Payer: Self-pay | Source: Ambulatory Visit | Attending: Orthopedic Surgery

## 2016-08-26 ENCOUNTER — Encounter (HOSPITAL_BASED_OUTPATIENT_CLINIC_OR_DEPARTMENT_OTHER): Payer: Self-pay | Admitting: *Deleted

## 2016-08-26 DIAGNOSIS — G5602 Carpal tunnel syndrome, left upper limb: Secondary | ICD-10-CM | POA: Insufficient documentation

## 2016-08-26 DIAGNOSIS — Z6833 Body mass index (BMI) 33.0-33.9, adult: Secondary | ICD-10-CM | POA: Insufficient documentation

## 2016-08-26 DIAGNOSIS — E669 Obesity, unspecified: Secondary | ICD-10-CM | POA: Insufficient documentation

## 2016-08-26 DIAGNOSIS — G709 Myoneural disorder, unspecified: Secondary | ICD-10-CM | POA: Diagnosis not present

## 2016-08-26 DIAGNOSIS — G43909 Migraine, unspecified, not intractable, without status migrainosus: Secondary | ICD-10-CM | POA: Diagnosis not present

## 2016-08-26 DIAGNOSIS — I10 Essential (primary) hypertension: Secondary | ICD-10-CM | POA: Diagnosis not present

## 2016-08-26 DIAGNOSIS — R51 Headache: Secondary | ICD-10-CM | POA: Insufficient documentation

## 2016-08-26 DIAGNOSIS — F329 Major depressive disorder, single episode, unspecified: Secondary | ICD-10-CM | POA: Diagnosis not present

## 2016-08-26 DIAGNOSIS — E785 Hyperlipidemia, unspecified: Secondary | ICD-10-CM | POA: Diagnosis not present

## 2016-08-26 HISTORY — PX: CARPAL TUNNEL RELEASE: SHX101

## 2016-08-26 HISTORY — DX: Personal history of Methicillin resistant Staphylococcus aureus infection: Z86.14

## 2016-08-26 HISTORY — DX: Carpal tunnel syndrome, left upper limb: G56.02

## 2016-08-26 SURGERY — CARPAL TUNNEL RELEASE
Anesthesia: General | Site: Wrist | Laterality: Left

## 2016-08-26 MED ORDER — BUPIVACAINE HCL (PF) 0.25 % IJ SOLN
INTRAMUSCULAR | Status: AC
  Filled 2016-08-26: qty 60

## 2016-08-26 MED ORDER — CHLORHEXIDINE GLUCONATE 4 % EX LIQD: 60.0000 mL | Freq: Once | CUTANEOUS | Status: DC

## 2016-08-26 MED ORDER — METOCLOPRAMIDE HCL 5 MG/ML IJ SOLN: 5.0000 mg | Freq: Three times a day (TID) | INTRAMUSCULAR | Status: DC | PRN

## 2016-08-26 MED ORDER — OXYCODONE HCL 5 MG/5ML PO SOLN: 5.0000 mg | Freq: Once | ORAL | Status: AC | PRN

## 2016-08-26 MED ORDER — BUPIVACAINE HCL (PF) 0.5 % IJ SOLN
INTRAMUSCULAR | Status: DC | PRN
  Administered 2016-08-26: 5 mL

## 2016-08-26 MED ORDER — DEXAMETHASONE SODIUM PHOSPHATE 10 MG/ML IJ SOLN
INTRAMUSCULAR | Status: AC
  Filled 2016-08-26: qty 1

## 2016-08-26 MED ORDER — EPHEDRINE SULFATE 50 MG/ML IJ SOLN
INTRAMUSCULAR | Status: DC | PRN
  Administered 2016-08-26: 10 mg via INTRAVENOUS

## 2016-08-26 MED ORDER — LIDOCAINE 2% (20 MG/ML) 5 ML SYRINGE
INTRAMUSCULAR | Status: DC | PRN
  Administered 2016-08-26: 40 mg via INTRAVENOUS

## 2016-08-26 MED ORDER — HYDROMORPHONE HCL 1 MG/ML IJ SOLN: 0.5000 mg | INTRAMUSCULAR | Status: DC | PRN

## 2016-08-26 MED ORDER — ONDANSETRON HCL 4 MG/2ML IJ SOLN: 4.0000 mg | Freq: Four times a day (QID) | INTRAMUSCULAR | Status: DC | PRN

## 2016-08-26 MED ORDER — PROPOFOL 500 MG/50ML IV EMUL
INTRAVENOUS | Status: AC
  Filled 2016-08-26: qty 50

## 2016-08-26 MED ORDER — LIDOCAINE 2% (20 MG/ML) 5 ML SYRINGE
INTRAMUSCULAR | Status: AC
  Filled 2016-08-26: qty 5

## 2016-08-26 MED ORDER — VANCOMYCIN HCL IN DEXTROSE 1-5 GM/200ML-% IV SOLN
1000.0000 mg | INTRAVENOUS | Status: AC
  Administered 2016-08-26: 1000 mg via INTRAVENOUS

## 2016-08-26 MED ORDER — ONDANSETRON HCL 4 MG/2ML IJ SOLN
INTRAMUSCULAR | Status: AC
  Filled 2016-08-26: qty 2

## 2016-08-26 MED ORDER — SCOPOLAMINE 1 MG/3DAYS TD PT72: 1.0000 | MEDICATED_PATCH | Freq: Once | TRANSDERMAL | Status: DC | PRN

## 2016-08-26 MED ORDER — VANCOMYCIN HCL IN DEXTROSE 1-5 GM/200ML-% IV SOLN
INTRAVENOUS | Status: AC
  Filled 2016-08-26: qty 200

## 2016-08-26 MED ORDER — FENTANYL CITRATE (PF) 100 MCG/2ML IJ SOLN
INTRAMUSCULAR | Status: AC
  Filled 2016-08-26: qty 2

## 2016-08-26 MED ORDER — LACTATED RINGERS IV SOLN: INTRAVENOUS | Status: DC

## 2016-08-26 MED ORDER — FENTANYL CITRATE (PF) 100 MCG/2ML IJ SOLN
50.0000 ug | INTRAMUSCULAR | Status: DC | PRN
  Administered 2016-08-26: 100 ug via INTRAVENOUS

## 2016-08-26 MED ORDER — BUPIVACAINE HCL (PF) 0.5 % IJ SOLN
INTRAMUSCULAR | Status: AC
  Filled 2016-08-26: qty 60

## 2016-08-26 MED ORDER — ONDANSETRON HCL 4 MG PO TABS: 4.0000 mg | ORAL_TABLET | Freq: Three times a day (TID) | ORAL | 0 refills | Status: DC | PRN

## 2016-08-26 MED ORDER — LACTATED RINGERS IV SOLN
INTRAVENOUS | Status: DC
  Administered 2016-08-26: 09:00:00 via INTRAVENOUS

## 2016-08-26 MED ORDER — OXYCODONE HCL 5 MG PO TABS
5.0000 mg | ORAL_TABLET | Freq: Once | ORAL | Status: AC | PRN
  Administered 2016-08-26: 5 mg via ORAL

## 2016-08-26 MED ORDER — FENTANYL CITRATE (PF) 100 MCG/2ML IJ SOLN: 25.0000 ug | INTRAMUSCULAR | Status: DC | PRN

## 2016-08-26 MED ORDER — ONDANSETRON HCL 4 MG/2ML IJ SOLN
INTRAMUSCULAR | Status: DC | PRN
  Administered 2016-08-26: 4 mg via INTRAVENOUS

## 2016-08-26 MED ORDER — DEXAMETHASONE SODIUM PHOSPHATE 4 MG/ML IJ SOLN
INTRAMUSCULAR | Status: DC | PRN
  Administered 2016-08-26: 10 mg via INTRAVENOUS

## 2016-08-26 MED ORDER — EPHEDRINE 5 MG/ML INJ
INTRAVENOUS | Status: AC
  Filled 2016-08-26: qty 10

## 2016-08-26 MED ORDER — METOCLOPRAMIDE HCL 5 MG PO TABS: 5.0000 mg | ORAL_TABLET | Freq: Three times a day (TID) | ORAL | Status: DC | PRN

## 2016-08-26 MED ORDER — OXYCODONE-ACETAMINOPHEN 5-325 MG PO TABS: 1.0000 | ORAL_TABLET | ORAL | 0 refills | Status: DC | PRN

## 2016-08-26 MED ORDER — PROPOFOL 10 MG/ML IV BOLUS
INTRAVENOUS | Status: DC | PRN
  Administered 2016-08-26: 200 mg via INTRAVENOUS

## 2016-08-26 MED ORDER — MIDAZOLAM HCL 2 MG/2ML IJ SOLN
INTRAMUSCULAR | Status: AC
  Filled 2016-08-26: qty 2

## 2016-08-26 MED ORDER — MIDAZOLAM HCL 2 MG/2ML IJ SOLN
1.0000 mg | INTRAMUSCULAR | Status: DC | PRN
  Administered 2016-08-26: 2 mg via INTRAVENOUS

## 2016-08-26 MED ORDER — OXYCODONE-ACETAMINOPHEN 5-325 MG PO TABS: 1.0000 | ORAL_TABLET | ORAL | Status: DC | PRN

## 2016-08-26 MED ORDER — OXYCODONE HCL 5 MG PO TABS
ORAL_TABLET | ORAL | Status: AC
  Filled 2016-08-26: qty 1

## 2016-08-26 MED ORDER — ONDANSETRON HCL 4 MG PO TABS: 4.0000 mg | ORAL_TABLET | Freq: Four times a day (QID) | ORAL | Status: DC | PRN

## 2016-08-26 SURGICAL SUPPLY — 43 items
BANDAGE ACE 3X5.8 VEL STRL LF (GAUZE/BANDAGES/DRESSINGS) ×2 IMPLANT
BLADE SURG 15 STRL LF DISP TIS (BLADE) ×1 IMPLANT
BLADE SURG 15 STRL SS (BLADE) ×2
BNDG CMPR 9X4 STRL LF SNTH (GAUZE/BANDAGES/DRESSINGS) ×1
BNDG COHESIVE 3X5 TAN STRL LF (GAUZE/BANDAGES/DRESSINGS) ×2 IMPLANT
BNDG ESMARK 4X9 LF (GAUZE/BANDAGES/DRESSINGS) ×1 IMPLANT
CORDS BIPOLAR (ELECTRODE) ×1 IMPLANT
COVER BACK TABLE 60X90IN (DRAPES) ×2 IMPLANT
COVER MAYO STAND STRL (DRAPES) ×2 IMPLANT
CUFF TOURNIQUET SINGLE 18IN (TOURNIQUET CUFF) ×1 IMPLANT
DRAPE EXTREMITY T 121X128X90 (DRAPE) ×2 IMPLANT
DRAPE SURG 17X23 STRL (DRAPES) ×2 IMPLANT
DURAPREP 26ML APPLICATOR (WOUND CARE) ×2 IMPLANT
GAUZE SPONGE 4X4 12PLY STRL (GAUZE/BANDAGES/DRESSINGS) ×2 IMPLANT
GAUZE XEROFORM 1X8 LF (GAUZE/BANDAGES/DRESSINGS) ×2 IMPLANT
GLOVE BIOGEL M 7.0 STRL (GLOVE) ×1 IMPLANT
GLOVE BIOGEL PI IND STRL 7.0 (GLOVE) ×1 IMPLANT
GLOVE BIOGEL PI IND STRL 8 (GLOVE) IMPLANT
GLOVE BIOGEL PI INDICATOR 7.0 (GLOVE) ×1
GLOVE BIOGEL PI INDICATOR 8 (GLOVE) ×1
GLOVE ECLIPSE 6.5 STRL STRAW (GLOVE) ×1 IMPLANT
GLOVE ECLIPSE 7.0 STRL STRAW (GLOVE) ×2 IMPLANT
GLOVE SURG ORTHO 8.0 STRL STRW (GLOVE) ×2 IMPLANT
GOWN STRL REUS W/ TWL LRG LVL3 (GOWN DISPOSABLE) ×1 IMPLANT
GOWN STRL REUS W/ TWL XL LVL3 (GOWN DISPOSABLE) ×2 IMPLANT
GOWN STRL REUS W/TWL LRG LVL3 (GOWN DISPOSABLE) ×2
GOWN STRL REUS W/TWL XL LVL3 (GOWN DISPOSABLE) ×4
NDL HYPO 25X1 1.5 SAFETY (NEEDLE) ×1 IMPLANT
NEEDLE HYPO 25X1 1.5 SAFETY (NEEDLE) ×2 IMPLANT
NS IRRIG 1000ML POUR BTL (IV SOLUTION) ×2 IMPLANT
PACK BASIN DAY SURGERY FS (CUSTOM PROCEDURE TRAY) ×2 IMPLANT
PAD CAST 3X4 CTTN HI CHSV (CAST SUPPLIES) ×2 IMPLANT
PADDING CAST ABS 3INX4YD NS (CAST SUPPLIES) ×1
PADDING CAST ABS COTTON 3X4 (CAST SUPPLIES) ×1 IMPLANT
PADDING CAST COTTON 3X4 STRL (CAST SUPPLIES) ×4
SPLINT PLASTER CAST XFAST 3X15 (CAST SUPPLIES) ×10 IMPLANT
SPLINT PLASTER XTRA FASTSET 3X (CAST SUPPLIES) ×10
STOCKINETTE 4X48 STRL (DRAPES) ×2 IMPLANT
SUT ETHILON 3 0 PS 1 (SUTURE) ×2 IMPLANT
SYR BULB 3OZ (MISCELLANEOUS) ×2 IMPLANT
SYR CONTROL 10ML LL (SYRINGE) ×2 IMPLANT
TOWEL OR 17X24 6PK STRL BLUE (TOWEL DISPOSABLE) ×2 IMPLANT
UNDERPAD 30X30 (UNDERPADS AND DIAPERS) ×2 IMPLANT

## 2016-08-26 NOTE — Transfer of Care (Signed)
Immediate Anesthesia Transfer of Care Note  Patient: Rhonda [name]  Procedure(s) Performed: Procedure(s): LEFT WRIST CARPAL TUNNEL RELEASE (Left)  Patient Location: PACU  Anesthesia Type:General  Level of Consciousness: awake and patient cooperative  Airway & Oxygen Therapy: Patient Spontanous Breathing and Patient connected to face mask oxygen  Post-op Assessment: Report given to RN and Post -op Vital signs reviewed and stable  Post vital signs: Reviewed and stable  Last Vitals:  Vitals:   08/26/16 0825  BP: 125/81  Pulse: 62  Resp: 16  Temp: 36.7 C    Last Pain:  Vitals:   08/26/16 0825  TempSrc: Oral  PainSc: 1       Patients Stated Pain Goal: 0 (08/26/16 0825)  Complications: No apparent anesthesia complications

## 2016-08-26 NOTE — Anesthesia Postprocedure Evaluation (Signed)
Anesthesia Post Note  Patient: Rhonda [name]  Procedure(s) Performed: Procedure(s) (LRB): LEFT WRIST CARPAL TUNNEL RELEASE (Left)  Patient location during evaluation: PACU Anesthesia Type: General Level of consciousness: awake and alert and patient cooperative Pain management: pain level controlled Vital Signs Assessment: post-procedure vital signs reviewed and stable Respiratory status: spontaneous breathing and respiratory function stable Cardiovascular status: stable Anesthetic complications: no       Last Vitals:  Vitals:   08/26/16 1000 08/26/16 1059  BP: 135/89 125/79  Pulse: 61 76  Resp: 16 18  Temp:  36.6 C    Last Pain:  Vitals:   08/26/16 1059  TempSrc: Oral  PainSc: 5                  Lorrine Killilea S

## 2016-08-26 NOTE — Anesthesia Procedure Notes (Signed)
Procedure Name: LMA Insertion Performed by: Edie Vallandingham W Pre-anesthesia Checklist: Patient identified, Emergency Drugs available, Suction available and Patient being monitored Patient Re-evaluated:Patient Re-evaluated prior to inductionOxygen Delivery Method: Circle system utilized Preoxygenation: Pre-oxygenation with 100% oxygen Intubation Type: IV induction Ventilation: Mask ventilation without difficulty LMA: LMA inserted LMA Size: 4.0 Number of attempts: 1 Placement Confirmation: positive ETCO2 Tube secured with: Tape Dental Injury: Teeth and Oropharynx as per pre-operative assessment        

## 2016-08-26 NOTE — Discharge Instructions (Signed)
Do not remove bandage.  Do not get bandage wet.  May apply ice for up to 20 min at a time for pain and swelling.  Fu appt in one week ° ° ° °Post Anesthesia Home Care Instructions ° °Activity: °Get plenty of rest for the remainder of the day. A responsible adult should stay with you for 24 hours following the procedure.  °For the next 24 hours, DO NOT: °-Drive a car °-Operate machinery °-Drink alcoholic beverages °-Take any medication unless instructed by your physician °-Make any legal decisions or sign important papers. ° °Meals: °Start with liquid foods such as gelatin or soup. Progress to regular foods as tolerated. Avoid greasy, spicy, heavy foods. If nausea and/or vomiting occur, drink only clear liquids until the nausea and/or vomiting subsides. Call your physician if vomiting continues. ° °Special Instructions/Symptoms: °Your throat may feel dry or sore from the anesthesia or the breathing tube placed in your throat during surgery. If this causes discomfort, gargle with warm salt water. The discomfort should disappear within 24 hours. ° °If you had a scopolamine patch placed behind your ear for the management of post- operative nausea and/or vomiting: ° °1. The medication in the patch is effective for 72 hours, after which it should be removed.  Wrap patch in a tissue and discard in the trash. Wash hands thoroughly with soap and water. °2. You may remove the patch earlier than 72 hours if you experience unpleasant side effects which may include dry mouth, dizziness or visual disturbances. °3. Avoid touching the patch. Wash your hands with soap and water after contact with the patch. °  °Call your surgeon if you experience:  ° °1.  Fever over 101.0. °2.  Inability to urinate. °3.  Nausea and/or vomiting. °4.  Extreme swelling or bruising at the surgical site. °5.  Continued bleeding from the incision. °6.  Increased pain, redness or drainage from the incision. °7.  Problems related to your pain  medication. °8.  Any problems and/or concerns ° ° °

## 2016-08-26 NOTE — Interval H&P Note (Signed)
History and Physical Interval Note:  08/26/2016 8:58 AM  Rhonda [name]  has presented today for surgery, with the diagnosis of Carpal tunnel syndrome left upper limb  G56.02  The various methods of treatment have been discussed with the patient and family. After consideration of risks, benefits and other options for treatment, the patient has consented to  Procedure(s): LEFT WRIST CARPAL TUNNEL RELEASE (Left) as a surgical intervention .  The patient's history has been reviewed, patient examined, no change in status, stable for surgery.  I have reviewed the patient's chart and labs.  Questions were answered to the patient's satisfaction.     Loreta Aveaniel F Newell Frater

## 2016-08-26 NOTE — Anesthesia Preprocedure Evaluation (Signed)
Anesthesia Evaluation  Patient identified by MRN, date of birth, ID band Patient awake    Reviewed: Allergy & Precautions, H&P , NPO status , Patient's Chart, lab work & pertinent test results  Airway Mallampati: II   Neck ROM: full    Dental   Pulmonary    breath sounds clear to auscultation       Cardiovascular hypertension,  Rhythm:regular Rate:Normal     Neuro/Psych  Headaches, PSYCHIATRIC DISORDERS Depression  Neuromuscular disease    GI/Hepatic   Endo/Other  obese  Renal/GU      Musculoskeletal  (+) Arthritis ,   Abdominal   Peds  Hematology   Anesthesia Other Findings   Reproductive/Obstetrics                             Anesthesia Physical Anesthesia Plan  ASA: II  Anesthesia Plan: General   Post-op Pain Management:    Induction: Intravenous  Airway Management Planned: LMA  Additional Equipment:   Intra-op Plan:   Post-operative Plan:   Informed Consent: I have reviewed the patients History and Physical, chart, labs and discussed the procedure including the risks, benefits and alternatives for the proposed anesthesia with the patient or authorized representative who has indicated his/her understanding and acceptance.     Plan Discussed with: CRNA, Anesthesiologist and Surgeon  Anesthesia Plan Comments:         Anesthesia Quick Evaluation

## 2016-08-27 ENCOUNTER — Encounter (HOSPITAL_BASED_OUTPATIENT_CLINIC_OR_DEPARTMENT_OTHER): Payer: Self-pay | Admitting: Orthopedic Surgery

## 2016-08-27 NOTE — Op Note (Signed)
[name]:  [name], Rhonda                ACCOUNT NO.:  [id number]  MEDICAL RECORD NO.:  [id number]  LOCATION:                                 FACILITY:  PHYSICIAN:  Rhonda [name], M.D. DATE OF BIRTH:  [birth year]  DATE OF PROCEDURE:  08/26/2016 DATE OF DISCHARGE:                              OPERATIVE REPORT   PREOPERATIVE DIAGNOSIS:  Left carpal tunnel syndrome.  POSTOPERATIVE DIAGNOSIS:  Left carpal tunnel syndrome.  PROCEDURE PERFORMED:  Left carpal tunnel release.  SURGEON-Rhonda [name]:  Rhonda [name], M.D.  ASSISTANT:  Rhonda KirschnerLindsey Stanberry, PA.  ANESTHESIA:  General.  BLOOD LOSS:  Minimal.  SPECIMENS:  None.  CULTURES:  None.  COMPLICATION:  None.  DRESSINGS:  Soft compressive bulky hand dressing and splint.  TOURNIQUET TIME:  45 minutes.  DESCRIPTION OF PROCEDURE:  The patient was brought to the operating room and after adequate anesthesia had been obtained, tourniquet applied. Prepped and draped in usual sterile fashion.  Exsanguinated with elevation of Esmarch.  Tourniquet inflated to 250 mmHg.  A small longitudinal incision over the carpal tunnel.  Skin and subcutaneous tissue divided.  Retinaculum of the carpal tunnel released under direct visualization from the forearm fascia proximally, the palmar arch distally.  Digital branch and motor branch identified and protected, decompressed.  Wound irrigated and closed with nylon.  Margins were injected with Marcaine.  Sterile compressive dressing with a bulky hand dressing and splint applied.  Tourniquet deflated and removed. Anesthesia reversed.  Brought to the recovery room.  Tolerated the surgery well.  No complications.     Rhonda [name], M.D.   ______________________________ Rhonda [name], M.D.    DFM/MEDQ  D:  08/26/2016  T:  08/27/2016  Job:  [id number]

## 2016-09-01 ENCOUNTER — Ambulatory Visit
Admission: RE | Admit: 2016-09-01 | Discharge: 2016-09-01 | Disposition: A | Discharge status: Home / Self Care | Payer: BLUE CROSS/BLUE SHIELD | Source: Ambulatory Visit | Attending: Family Medicine | Admitting: Family Medicine

## 2016-09-01 DIAGNOSIS — Z1231 Encounter for screening mammogram for malignant neoplasm of breast: Secondary | ICD-10-CM | POA: Diagnosis not present

## 2016-09-01 DIAGNOSIS — Z1239 Encounter for other screening for malignant neoplasm of breast: Secondary | ICD-10-CM

## 2016-09-03 DIAGNOSIS — G5602 Carpal tunnel syndrome, left upper limb: Secondary | ICD-10-CM | POA: Diagnosis not present

## 2016-09-10 DIAGNOSIS — G5603 Carpal tunnel syndrome, bilateral upper limbs: Secondary | ICD-10-CM | POA: Diagnosis not present

## 2016-09-10 DIAGNOSIS — M542 Cervicalgia: Secondary | ICD-10-CM | POA: Diagnosis not present

## 2016-09-14 DIAGNOSIS — M79669 Pain in unspecified lower leg: Secondary | ICD-10-CM | POA: Diagnosis not present

## 2016-09-16 DIAGNOSIS — M79662 Pain in left lower leg: Secondary | ICD-10-CM | POA: Diagnosis not present

## 2016-10-11 DIAGNOSIS — M542 Cervicalgia: Secondary | ICD-10-CM | POA: Diagnosis not present

## 2016-11-10 DIAGNOSIS — M542 Cervicalgia: Secondary | ICD-10-CM | POA: Diagnosis not present

## 2016-11-13 ENCOUNTER — Other Ambulatory Visit: Payer: Self-pay | Admitting: Family Medicine

## 2016-11-15 ENCOUNTER — Telehealth: Payer: Self-pay | Admitting: *Deleted

## 2016-11-15 MED ORDER — PAROXETINE HCL 10 MG PO TABS: 10.0000 mg | ORAL_TABLET | Freq: Every day | ORAL | 0 refills | Status: DC

## 2016-11-15 NOTE — Telephone Encounter (Signed)
She has been on this for a while, may have changed formulary She can go to paxil 10mg  daily generic

## 2016-11-15 NOTE — Telephone Encounter (Signed)
Call placed to patient. LMTRC.  

## 2016-11-15 NOTE — Telephone Encounter (Signed)
Received call from patient.   States that Paxil CR is too expensive (> $100.00 after insurance). Requested MD to advise if any medication is cheaper.

## 2016-11-15 NOTE — Telephone Encounter (Signed)
Call placed to patient and patient made aware.  States that we will look into insurance formulary.   Until then, she will go to Paxil 10mg  PO QD.

## 2016-11-18 NOTE — Telephone Encounter (Signed)
Call placed to patient insurance.   Was not able to determine cause of increased medication.   Call placed to patient and patient made aware.   Patient states that she feels ok with Paxil 10mg , but she is having increased hot flashes and sweating.   Advised to try OTC Black Cohosh or TransMontaigneEstroven.   MD please advise.

## 2016-11-19 NOTE — Telephone Encounter (Signed)
Call placed to patient and patient made aware.  

## 2016-11-19 NOTE — Telephone Encounter (Signed)
Insurance will not pay, Keep taking the paxil 10mg  , she needs to call herself and check into why they will no longer pay, we have done everything on this side She can take the estroven has done in the past and it did not help as much which is why she was on 12.5mg  Paxil

## 2016-12-16 ENCOUNTER — Other Ambulatory Visit: Payer: Self-pay | Admitting: Family Medicine

## 2016-12-17 DIAGNOSIS — G5603 Carpal tunnel syndrome, bilateral upper limbs: Secondary | ICD-10-CM | POA: Diagnosis not present

## 2016-12-21 ENCOUNTER — Other Ambulatory Visit: Payer: Self-pay | Admitting: Family Medicine

## 2017-01-18 DIAGNOSIS — M65312 Trigger thumb, left thumb: Secondary | ICD-10-CM | POA: Diagnosis not present

## 2017-01-22 DIAGNOSIS — L309 Dermatitis, unspecified: Secondary | ICD-10-CM | POA: Diagnosis not present

## 2017-01-31 ENCOUNTER — Ambulatory Visit: Payer: BLUE CROSS/BLUE SHIELD | Admitting: Family Medicine

## 2017-01-31 NOTE — H&P (Signed)
This is a pleasant 56 year-old female who presents to our clinic today for left thumb triggering.  This has been ongoing for the past month and has progressively worsened.  She notices this most first thing in the morning and has marked pain to the MCP joint.  No history of trigger finger in the past.   Past medical, social and family history reviewed in detail on the patient questionnaire and signed.  Review of systems: As detailed in HPI.  All others reviewed and are negative.   EXAMINATION: Well-developed, well-nourished female in no acute distress.  Alert and oriented x 3.  Examination of her left thumb reveals marked tenderness of the first MCP with a palpable nodule.  She is having triggering of the A1 pulley.    IMPRESSION: Left thumb trigger finger.    PLAN: Today we discussed a Cortisone injection, as well as definitive treatment of surgical intervention to include an A1 pulley release.  She would like to proceed with surgical intervention.  Risks, benefits and possible complications reviewed.  Rehab and recovery time discussed.  Paperwork complete.

## 2017-02-03 ENCOUNTER — Encounter (HOSPITAL_BASED_OUTPATIENT_CLINIC_OR_DEPARTMENT_OTHER): Payer: Self-pay | Admitting: *Deleted

## 2017-02-04 ENCOUNTER — Other Ambulatory Visit: Payer: Self-pay | Admitting: Family Medicine

## 2017-02-04 DIAGNOSIS — I10 Essential (primary) hypertension: Secondary | ICD-10-CM

## 2017-02-08 ENCOUNTER — Encounter (HOSPITAL_BASED_OUTPATIENT_CLINIC_OR_DEPARTMENT_OTHER)
Admission: RE | Admit: 2017-02-08 | Discharge: 2017-02-08 | Disposition: A | Discharge status: Home / Self Care | Payer: BLUE CROSS/BLUE SHIELD | Source: Ambulatory Visit | Attending: Orthopedic Surgery | Admitting: Orthopedic Surgery

## 2017-02-08 DIAGNOSIS — Z01818 Encounter for other preprocedural examination: Secondary | ICD-10-CM

## 2017-02-08 DIAGNOSIS — M65312 Trigger thumb, left thumb: Secondary | ICD-10-CM | POA: Diagnosis not present

## 2017-02-08 LAB — BASIC METABOLIC PANEL
ANION GAP: 7 (ref 5–15)
BUN: 10 mg/dL (ref 6–20)
CALCIUM: 9.2 mg/dL (ref 8.9–10.3)
CO2: 29 mmol/L (ref 22–32)
Chloride: 102 mmol/L (ref 101–111)
Creatinine, Ser: 0.79 mg/dL (ref 0.44–1.00)
Glucose, Bld: 86 mg/dL (ref 65–99)
POTASSIUM: 4.3 mmol/L (ref 3.5–5.1)
Sodium: 138 mmol/L (ref 135–145)

## 2017-02-09 ENCOUNTER — Encounter: Payer: Self-pay | Admitting: Family Medicine

## 2017-02-09 ENCOUNTER — Ambulatory Visit (INDEPENDENT_AMBULATORY_CARE_PROVIDER_SITE_OTHER): Payer: BLUE CROSS/BLUE SHIELD | Admitting: Family Medicine

## 2017-02-09 VITALS — BP 126/84 | HR 88 | Temp 98.4°F | Resp 14 | Ht 65.0 in | Wt 199.0 lb

## 2017-02-09 DIAGNOSIS — L720 Epidermal cyst: Secondary | ICD-10-CM | POA: Diagnosis not present

## 2017-02-09 DIAGNOSIS — Z6833 Body mass index (BMI) 33.0-33.9, adult: Secondary | ICD-10-CM

## 2017-02-09 DIAGNOSIS — E6609 Other obesity due to excess calories: Secondary | ICD-10-CM

## 2017-02-09 DIAGNOSIS — R21 Rash and other nonspecific skin eruption: Secondary | ICD-10-CM

## 2017-02-09 DIAGNOSIS — E7800 Pure hypercholesterolemia, unspecified: Secondary | ICD-10-CM | POA: Diagnosis not present

## 2017-02-09 DIAGNOSIS — S39012S Strain of muscle, fascia and tendon of lower back, sequela: Secondary | ICD-10-CM

## 2017-02-09 DIAGNOSIS — I10 Essential (primary) hypertension: Secondary | ICD-10-CM

## 2017-02-09 MED ORDER — CYCLOBENZAPRINE HCL 10 MG PO TABS: 10.0000 mg | ORAL_TABLET | Freq: Three times a day (TID) | ORAL | 0 refills | Status: DC | PRN

## 2017-02-09 MED ORDER — CLOTRIMAZOLE-BETAMETHASONE 1-0.05 % EX CREA: 1.0000 "application " | TOPICAL_CREAM | Freq: Two times a day (BID) | CUTANEOUS | 0 refills | Status: DC

## 2017-02-09 NOTE — Patient Instructions (Addendum)
F/U 3 months  Plastic surgery referral for cyst near eye Use cream for rash  Use muscle rub  Muscle  Dr. Harley HallmarkFung- Keto Diet / website - [email or web address]

## 2017-02-09 NOTE — Progress Notes (Signed)
Subjective:    Patient ID: Rhonda [name], female    DOB: [birth year], 56 y.o.   MRN: [id number]  Patient presents for 6 month F/U (is not fasting); Weight Loss (wasnts to discuss weight loss med); Rash (skin irritation to L shoulder- has been seen at [facility] and was given prednisone); and Back Pain (lower back pain since jumping on trampoline at trampoline park)  Pt here to f/u chronic medical problems.    HTN- taking BP medication as prescribed, no side effects  Obesity- interested in weight loss medication - she has been trying to cut back on carbs/sweets, last visit weight   She is scheduled for trigger finger surgery tomorrow   She has chronic pain on cymalta  given by [facility] , was jumping on trampoline, has some low back pain this past weekend, also helps move her son with special needs , no parethesia LE, no radiating pain, no change in bowel or bladder    Hyperlipidemia- LDL elevated at 140, due for repeat cholesterol check    Obesity- weight was up to 207lbs, started on Keto diet, also some intermittant fasting, she started this about 3 weeks ago, but upset weight is not coming off fast enough and wanted to discuss weight loss medications   She did have a rash broke out beneath axilla and breast, also on top of left shoulder, seen at [facility] given prednisone cleared up all of the rash except for spot on shoulder which still has some itching  She wants lesion removed from corner of right eye, not sure if it is getting bigger but bothering her  Review Of Systems:  GEN- denies fatigue, fever, weight loss,weakness, recent illness HEENT- denies eye drainage, change in vision, nasal discharge, CVS- denies chest pain, palpitations RESP- denies SOB, cough, wheeze ABD- denies N/V, change in stools, abd pain GU- denies dysuria, hematuria, dribbling, incontinence MSK-+ joint pain, muscle aches, injury Neuro- denies headache, dizziness, syncope, seizure activity        Objective:    BP 126/84   Pulse 88   Temp 98.4 F (36.9 C) (Oral)   Resp 14   Ht 5\' 5"  (1.651 m)   Wt 199 lb (90.3 kg)   SpO2 98%   BMI 33.12 kg/m  GEN- NAD, alert and oriented x3 HEENT- PERRL, EOMI, non injected sclera, pink conjunctiva, MMM, oropharynx clear, cyst corner of right eye  Neck- Supple, no thyromegaly CVS- RRR, no murmur RESP-CTAB ABD-NABS,soft,NT,ND Skin-  Left shoulder slight raised hyperpigmented rash scaley with central clearing - oval shaped 2-3 inches long MSK- TTP lower thoracic left paraspinals, +spasm, fair ROM, lumbar spine NT, neg SLR  equal strenth LE Neuro- normal tone, antalgiic gait, DTR symmetric EXT- No edema Pulses- Radial, DP- 2+        Assessment & Plan:      Problem List Items Addressed This Visit    Hyperlipidemia   Obesity    Recommend she continue with her nutrition plan, advised nothing will make her lose weight quickly so that she is able to keep it off.  Hold on medications, but would consider Saxenda in the future I think she is looking for quick fix and needs to address the diet first       Essential hypertension, benign - Primary    Good control no changes       Other Visit Diagnoses    Epidermal cyst of face       Refer to plastic surgery to  have removed, has been present for many years    Rash and nonspecific skin eruption       ? fungal rash as it did not clear completely with steroids, given lotrisone as she still has some itching/inflammation   Back strain, sequela       MSK strain, flexeril, NSAIDS      Note: This dictation was prepared with Dragon dictation along with smaller phrase technology. Any transcriptional errors that result from this process are unintentional.

## 2017-02-10 ENCOUNTER — Encounter (HOSPITAL_BASED_OUTPATIENT_CLINIC_OR_DEPARTMENT_OTHER): Payer: Self-pay | Admitting: Anesthesiology

## 2017-02-10 ENCOUNTER — Ambulatory Visit (HOSPITAL_BASED_OUTPATIENT_CLINIC_OR_DEPARTMENT_OTHER)
Admission: RE | Admit: 2017-02-10 | Discharge: 2017-02-10 | Disposition: A | Discharge status: Home / Self Care | Payer: BLUE CROSS/BLUE SHIELD | Source: Ambulatory Visit | Attending: Orthopedic Surgery | Admitting: Orthopedic Surgery

## 2017-02-10 ENCOUNTER — Ambulatory Visit (HOSPITAL_BASED_OUTPATIENT_CLINIC_OR_DEPARTMENT_OTHER): Payer: BLUE CROSS/BLUE SHIELD | Admitting: Anesthesiology

## 2017-02-10 ENCOUNTER — Encounter (HOSPITAL_BASED_OUTPATIENT_CLINIC_OR_DEPARTMENT_OTHER)
Admission: RE | Disposition: A | Discharge status: Home / Self Care | Payer: Self-pay | Source: Ambulatory Visit | Attending: Orthopedic Surgery

## 2017-02-10 DIAGNOSIS — E785 Hyperlipidemia, unspecified: Secondary | ICD-10-CM | POA: Diagnosis not present

## 2017-02-10 DIAGNOSIS — G43909 Migraine, unspecified, not intractable, without status migrainosus: Secondary | ICD-10-CM | POA: Diagnosis not present

## 2017-02-10 DIAGNOSIS — M65312 Trigger thumb, left thumb: Secondary | ICD-10-CM | POA: Diagnosis not present

## 2017-02-10 DIAGNOSIS — I10 Essential (primary) hypertension: Secondary | ICD-10-CM | POA: Diagnosis not present

## 2017-02-10 HISTORY — PX: TRIGGER FINGER RELEASE: SHX641

## 2017-02-10 SURGERY — RELEASE, A1 PULLEY, FOR TRIGGER FINGER
Anesthesia: General | Site: Hand | Laterality: Left

## 2017-02-10 MED ORDER — VANCOMYCIN HCL IN DEXTROSE 1-5 GM/200ML-% IV SOLN
INTRAVENOUS | Status: AC
  Filled 2017-02-10: qty 200

## 2017-02-10 MED ORDER — VANCOMYCIN HCL IN DEXTROSE 1-5 GM/200ML-% IV SOLN
1000.0000 mg | INTRAVENOUS | Status: AC
  Administered 2017-02-10 (×2): 1000 mg via INTRAVENOUS

## 2017-02-10 MED ORDER — CHLORHEXIDINE GLUCONATE 4 % EX LIQD: 60.0000 mL | Freq: Once | CUTANEOUS | Status: DC

## 2017-02-10 MED ORDER — MIDAZOLAM HCL 2 MG/2ML IJ SOLN
1.0000 mg | INTRAMUSCULAR | Status: DC | PRN
  Administered 2017-02-10: 2 mg via INTRAVENOUS

## 2017-02-10 MED ORDER — FENTANYL CITRATE (PF) 100 MCG/2ML IJ SOLN
50.0000 ug | INTRAMUSCULAR | Status: DC | PRN
  Administered 2017-02-10: 100 ug via INTRAVENOUS

## 2017-02-10 MED ORDER — SCOPOLAMINE 1 MG/3DAYS TD PT72: 1.0000 | MEDICATED_PATCH | Freq: Once | TRANSDERMAL | Status: DC | PRN

## 2017-02-10 MED ORDER — HYDROCODONE-ACETAMINOPHEN 5-325 MG PO TABS: 1.0000 | ORAL_TABLET | ORAL | 0 refills | Status: DC | PRN

## 2017-02-10 MED ORDER — LACTATED RINGERS IV SOLN
INTRAVENOUS | Status: DC
  Administered 2017-02-10: 11:00:00 via INTRAVENOUS

## 2017-02-10 MED ORDER — ONDANSETRON HCL 4 MG/2ML IJ SOLN
INTRAMUSCULAR | Status: DC | PRN
  Administered 2017-02-10: 4 mg via INTRAVENOUS

## 2017-02-10 MED ORDER — BUPIVACAINE HCL (PF) 0.5 % IJ SOLN
INTRAMUSCULAR | Status: DC | PRN
  Administered 2017-02-10: 4 mL

## 2017-02-10 MED ORDER — LIDOCAINE HCL (CARDIAC) 20 MG/ML IV SOLN
INTRAVENOUS | Status: DC | PRN
  Administered 2017-02-10: 50 mg via INTRAVENOUS

## 2017-02-10 MED ORDER — PROPOFOL 10 MG/ML IV BOLUS
INTRAVENOUS | Status: AC
  Filled 2017-02-10: qty 20

## 2017-02-10 MED ORDER — LACTATED RINGERS IV SOLN
INTRAVENOUS | Status: DC
  Administered 2017-02-10: 10:00:00 via INTRAVENOUS

## 2017-02-10 MED ORDER — MIDAZOLAM HCL 2 MG/2ML IJ SOLN
INTRAMUSCULAR | Status: AC
  Filled 2017-02-10: qty 2

## 2017-02-10 MED ORDER — FENTANYL CITRATE (PF) 100 MCG/2ML IJ SOLN
INTRAMUSCULAR | Status: AC
  Filled 2017-02-10: qty 2

## 2017-02-10 MED ORDER — DEXAMETHASONE SODIUM PHOSPHATE 10 MG/ML IJ SOLN
INTRAMUSCULAR | Status: DC | PRN
  Administered 2017-02-10: 10 mg via INTRAVENOUS

## 2017-02-10 MED ORDER — ONDANSETRON HCL 4 MG PO TABS: 4.0000 mg | ORAL_TABLET | Freq: Three times a day (TID) | ORAL | 0 refills | Status: DC | PRN

## 2017-02-10 MED ORDER — PROPOFOL 10 MG/ML IV BOLUS
INTRAVENOUS | Status: DC | PRN
  Administered 2017-02-10: 150 mg via INTRAVENOUS

## 2017-02-10 MED ORDER — BUPIVACAINE HCL (PF) 0.5 % IJ SOLN
INTRAMUSCULAR | Status: AC
  Filled 2017-02-10: qty 60

## 2017-02-10 SURGICAL SUPPLY — 43 items
BANDAGE ACE 3X5.8 VEL STRL LF (GAUZE/BANDAGES/DRESSINGS) ×2 IMPLANT
BLADE SURG 15 STRL LF DISP TIS (BLADE) ×1 IMPLANT
BLADE SURG 15 STRL SS (BLADE) ×2
BNDG CMPR 9X4 STRL LF SNTH (GAUZE/BANDAGES/DRESSINGS) ×1
BNDG COHESIVE 3X5 TAN STRL LF (GAUZE/BANDAGES/DRESSINGS) ×2 IMPLANT
BNDG ESMARK 4X9 LF (GAUZE/BANDAGES/DRESSINGS) ×1 IMPLANT
CORD BIPOLAR FORCEPS 12FT (ELECTRODE) ×1 IMPLANT
COVER BACK TABLE 60X90IN (DRAPES) ×2 IMPLANT
COVER MAYO STAND STRL (DRAPES) ×2 IMPLANT
CUFF TOURNIQUET SINGLE 18IN (TOURNIQUET CUFF) ×1 IMPLANT
DRAPE EXTREMITY T 121X128X90 (DRAPE) ×2 IMPLANT
DRAPE SURG 17X23 STRL (DRAPES) ×2 IMPLANT
DRSG PAD ABDOMINAL 8X10 ST (GAUZE/BANDAGES/DRESSINGS) ×2 IMPLANT
DURAPREP 26ML APPLICATOR (WOUND CARE) ×2 IMPLANT
GAUZE SPONGE 4X4 12PLY STRL (GAUZE/BANDAGES/DRESSINGS) ×2 IMPLANT
GAUZE XEROFORM 1X8 LF (GAUZE/BANDAGES/DRESSINGS) ×2 IMPLANT
GLOVE BIOGEL PI IND STRL 7.0 (GLOVE) ×1 IMPLANT
GLOVE BIOGEL PI INDICATOR 7.0 (GLOVE) ×1
GLOVE ECLIPSE 7.0 STRL STRAW (GLOVE) ×2 IMPLANT
GLOVE SURG ORTHO 8.0 STRL STRW (GLOVE) ×2 IMPLANT
GOWN STRL REUS W/ TWL LRG LVL3 (GOWN DISPOSABLE) ×1 IMPLANT
GOWN STRL REUS W/ TWL XL LVL3 (GOWN DISPOSABLE) ×2 IMPLANT
GOWN STRL REUS W/TWL LRG LVL3 (GOWN DISPOSABLE) ×2
GOWN STRL REUS W/TWL XL LVL3 (GOWN DISPOSABLE) ×4
NDL HYPO 25X1 1.5 SAFETY (NEEDLE) ×1 IMPLANT
NEEDLE HYPO 25X1 1.5 SAFETY (NEEDLE) ×2 IMPLANT
NS IRRIG 1000ML POUR BTL (IV SOLUTION) ×2 IMPLANT
PACK BASIN DAY SURGERY FS (CUSTOM PROCEDURE TRAY) ×2 IMPLANT
PAD CAST 3X4 CTTN HI CHSV (CAST SUPPLIES) ×2 IMPLANT
PADDING CAST ABS 3INX4YD NS (CAST SUPPLIES) ×1
PADDING CAST ABS 4INX4YD NS (CAST SUPPLIES) ×1
PADDING CAST ABS COTTON 3X4 (CAST SUPPLIES) ×1 IMPLANT
PADDING CAST ABS COTTON 4X4 ST (CAST SUPPLIES) ×1 IMPLANT
PADDING CAST COTTON 3X4 STRL (CAST SUPPLIES) ×2
SPLINT FIBERGLASS 3X35 (CAST SUPPLIES) ×2 IMPLANT
SPLINT PLASTER CAST XFAST 3X15 (CAST SUPPLIES) IMPLANT
SPLINT PLASTER XTRA FASTSET 3X (CAST SUPPLIES)
STOCKINETTE 4X48 STRL (DRAPES) ×2 IMPLANT
SUT ETHILON 3 0 PS 1 (SUTURE) ×2 IMPLANT
SYR BULB 3OZ (MISCELLANEOUS) ×2 IMPLANT
SYR CONTROL 10ML LL (SYRINGE) ×2 IMPLANT
TOWEL OR 17X24 6PK STRL BLUE (TOWEL DISPOSABLE) ×2 IMPLANT
UNDERPAD 30X30 (UNDERPADS AND DIAPERS) ×2 IMPLANT

## 2017-02-10 NOTE — Assessment & Plan Note (Signed)
Good control no changes

## 2017-02-10 NOTE — Interval H&P Note (Signed)
History and Physical Interval Note:  02/10/2017 10:32 AM  Rhonda [name]  has presented today for surgery, with the diagnosis of LEFT TRIGGER THUMB  The various methods of treatment have been discussed with the patient and family. After consideration of risks, benefits and other options for treatment, the patient has consented to  Procedure(s): LEFT TRIGGER THUMB RELEASE (TENDON SHEATH INCISION) (Left) as a surgical intervention .  The patient's history has been reviewed, patient examined, no change in status, stable for surgery.  I have reviewed the patient's chart and labs.  Questions were answered to the patient's satisfaction.     Loreta Aveaniel F Murphy

## 2017-02-10 NOTE — Anesthesia Procedure Notes (Signed)
Procedure Name: LMA Insertion Date/Time: 02/10/2017 11:23 AM Performed by: Zenia ResidesPAYNE, Korie Streat D Pre-anesthesia Checklist: Patient identified, Emergency Drugs available, Suction available and Patient being monitored Patient Re-evaluated:Patient Re-evaluated prior to induction Oxygen Delivery Method: Circle system utilized Preoxygenation: Pre-oxygenation with 100% oxygen Induction Type: IV induction Ventilation: Mask ventilation without difficulty LMA: LMA inserted LMA Size: 4.0 Number of attempts: 1 Airway Equipment and Method: Bite block Placement Confirmation: positive ETCO2 Tube secured with: Tape Dental Injury: Teeth and Oropharynx as per pre-operative assessment

## 2017-02-10 NOTE — Anesthesia Postprocedure Evaluation (Signed)
Anesthesia Post Note  Patient: Rhonda [name]  Procedure(s) Performed: Procedure(s) (LRB): LEFT TRIGGER THUMB RELEASE (TENDON SHEATH INCISION) (Left)     Patient location during evaluation: PACU Anesthesia Type: General Level of consciousness: awake Pain management: pain level controlled Vital Signs Assessment: post-procedure vital signs reviewed and stable Respiratory status: spontaneous breathing Cardiovascular status: stable Postop Assessment: no signs of nausea or vomiting Anesthetic complications: no    Last Vitals:  Vitals:   02/10/17 1215 02/10/17 1230  BP: (!) 142/83 (!) 155/92  Pulse: 61   Resp: 18   Temp:    SpO2: 97%     Last Pain:  Vitals:   02/10/17 1213  TempSrc:   PainSc: 0-No pain   Pain Goal:                 Faten Frieson JR,JOHN Caryle Helgeson

## 2017-02-10 NOTE — Transfer of Care (Signed)
Immediate Anesthesia Transfer of Care Note  Patient: Rhonda [name]  Procedure(s) Performed: Procedure(s): LEFT TRIGGER THUMB RELEASE (TENDON SHEATH INCISION) (Left)  Patient Location: PACU  Anesthesia Type:General  Level of Consciousness: sedated  Airway & Oxygen Therapy: Patient Spontanous Breathing and Patient connected to face mask oxygen  Post-op Assessment: Report given to RN and Post -op Vital signs reviewed and stable  Post vital signs: Reviewed and stable  Last Vitals:  Vitals:   02/10/17 1021  BP: 135/88  Pulse: 72  Temp: 36.7 C  SpO2: 99%    Last Pain:  Vitals:   02/10/17 1021  TempSrc: Oral  PainSc: 3          Complications: No apparent anesthesia complications

## 2017-02-10 NOTE — Discharge Instructions (Signed)
Do not remove bandage.  Do not get bandage wet.  Apply ice and elevate for swelling   Post Anesthesia Home Care Instructions  Activity: Get plenty of rest for the remainder of the day. A responsible individual must stay with you for 24 hours following the procedure.  For the next 24 hours, DO NOT: -Drive a car -Advertising copywriterperate machinery -Drink alcoholic beverages -Take any medication unless instructed by your physician -Make any legal decisions or sign important papers.  Meals: Start with liquid foods such as gelatin or soup. Progress to regular foods as tolerated. Avoid greasy, spicy, heavy foods. If nausea and/or vomiting occur, drink only clear liquids until the nausea and/or vomiting subsides. Call your physician if vomiting continues.  Special Instructions/Symptoms: Your throat may feel dry or sore from the anesthesia or the breathing tube placed in your throat during surgery. If this causes discomfort, gargle with warm salt water. The discomfort should disappear within 24 hours.  If you had a scopolamine patch placed behind your ear for the management of post- operative nausea and/or vomiting:  1. The medication in the patch is effective for 72 hours, after which it should be removed.  Wrap patch in a tissue and discard in the trash. Wash hands thoroughly with soap and water. 2. You may remove the patch earlier than 72 hours if you experience unpleasant side effects which may include dry mouth, dizziness or visual disturbances. 3. Avoid touching the patch. Wash your hands with soap and water after contact with the patch.

## 2017-02-10 NOTE — Anesthesia Preprocedure Evaluation (Signed)
Anesthesia Evaluation  Patient identified by MRN, date of birth, ID band Patient awake    Reviewed: Allergy & Precautions, H&P , NPO status , Patient's Chart, lab work & pertinent test results  Airway Mallampati: II   Neck ROM: full    Dental no notable dental hx. (+) Teeth Intact   Pulmonary    Pulmonary exam normal breath sounds clear to auscultation       Cardiovascular hypertension, Pt. on medications  Rhythm:regular Rate:Normal     Neuro/Psych  Headaches, PSYCHIATRIC DISORDERS Depression  Neuromuscular disease    GI/Hepatic negative GI ROS, Neg liver ROS,   Endo/Other  negative endocrine ROSobese  Renal/GU negative Renal ROS  negative genitourinary   Musculoskeletal  (+) Arthritis ,   Abdominal (+) + obese,   Peds  Hematology negative hematology ROS (+)   Anesthesia Other Findings   Reproductive/Obstetrics negative OB ROS                             Anesthesia Physical  Anesthesia Plan  ASA: II  Anesthesia Plan: General   Post-op Pain Management:    Induction: Intravenous  PONV Risk Score and Plan: 3 and Ondansetron, Dexamethasone, Midazolam and Propofol infusion  Airway Management Planned: LMA  Additional Equipment:   Intra-op Plan:   Post-operative Plan:   Informed Consent: I have reviewed the patients History and Physical, chart, labs and discussed the procedure including the risks, benefits and alternatives for the proposed anesthesia with the patient or authorized representative who has indicated his/her understanding and acceptance.     Plan Discussed with: CRNA and Surgeon  Anesthesia Plan Comments:         Anesthesia Quick Evaluation

## 2017-02-10 NOTE — Assessment & Plan Note (Signed)
Recommend she continue with her nutrition plan, advised nothing will make her lose weight quickly so that she is able to keep it off.  Hold on medications, but would consider Saxenda in the future I think she is looking for quick fix and needs to address the diet first

## 2017-02-11 ENCOUNTER — Encounter (HOSPITAL_BASED_OUTPATIENT_CLINIC_OR_DEPARTMENT_OTHER): Payer: Self-pay | Admitting: Orthopedic Surgery

## 2017-02-11 NOTE — Op Note (Signed)
NAME:  Rhonda [name], Rhonda [name] NO.:  MEDICAL RECORD NO.:  [id number]  LOCATION:                                 FACILITY:  PHYSICIAN:  Loreta Ave, M.D.      DATE OF BIRTH:  DATE OF PROCEDURE:  02/10/2017 DATE OF DISCHARGE:                              OPERATIVE REPORT   PREOPERATIVE DIAGNOSIS:  Chronic triggering A1 pulley, left thumb.  POSTOPERATIVE DIAGNOSIS:  Chronic triggering A1 pulley, left thumb.  PROCEDURE:  Release A1 pulley, left thumb.  SURGEON:  Loreta Ave, M.D.  ASSISTANT:  Mikey Kirschner, PA, present throughout the entire case and necessary for timely completion of procedure.  ANESTHESIA:  General.  BLOOD LOSS:  Minimal.  SPECIMENS:  None.  CULTURES:  None.  COMPLICATIONS:  None.  DRESSINGS:  Sterile compressive.  TOURNIQUET TIME:  30 minutes.  DESCRIPTION OF PROCEDURE:  The patient was brought to the operating room and after adequate anesthesia had been obtained, a tourniquet applied. Prepped and draped in usual sterile fashion.  Exsanguinated with elevation of Esmarch.  Tourniquet inflated to 250 mmHg.  A small transverse incision of A1 pulley, left thumb protecting neurovascular structures.  Under direct visualization, A1 pulley released in its entirety.  Fair amount of fluid as well as attritional tearing on the top of the tendon but longitudinally intact.  Irrigated.  Complete release confirmed. Closed with nylon.  Margins were injected with Marcaine.  Sterile compressive dressing applied.  Tourniquet deflated and removed. Anesthesia reversed.  Brought to the recovery room.  Tolerated the surgery well.  No complications.     Loreta Ave, M.D.     DFM/MEDQ  D:  02/10/2017  T:  02/10/2017  Job:  [id number]

## 2017-02-16 ENCOUNTER — Telehealth: Payer: Self-pay | Admitting: *Deleted

## 2017-02-16 MED ORDER — NAPROXEN 500 MG PO TABS: 500.0000 mg | ORAL_TABLET | Freq: Two times a day (BID) | ORAL | 0 refills | Status: DC

## 2017-02-16 NOTE — Telephone Encounter (Signed)
Received call from patient.   Reports that back pain is no better from strain. States that she continues to use NSAID's and muscle rub with no relief.   Requested MD to advise.

## 2017-02-16 NOTE — Telephone Encounter (Signed)
Call placed to patient and patient made aware.   Prescription sent to pharmacy.  

## 2017-02-16 NOTE — Telephone Encounter (Signed)
This was muscle strain it can take a few weeks to get better Continue heat to area Give NAprosyn 500mg  BID, take with food for 2 weeks She also has pain medication from her surgeon she can take  She can schedule a recheck in 2 weeks if still not improved

## 2017-02-18 ENCOUNTER — Other Ambulatory Visit: Payer: Self-pay | Admitting: Family Medicine

## 2017-02-18 ENCOUNTER — Ambulatory Visit (INDEPENDENT_AMBULATORY_CARE_PROVIDER_SITE_OTHER): Payer: BLUE CROSS/BLUE SHIELD | Admitting: Family Medicine

## 2017-02-18 ENCOUNTER — Encounter: Payer: Self-pay | Admitting: Family Medicine

## 2017-02-18 VITALS — BP 122/68 | HR 82 | Temp 98.0°F | Resp 16 | Ht 65.0 in | Wt 198.0 lb

## 2017-02-18 DIAGNOSIS — G5603 Carpal tunnel syndrome, bilateral upper limbs: Secondary | ICD-10-CM | POA: Diagnosis not present

## 2017-02-18 DIAGNOSIS — M546 Pain in thoracic spine: Secondary | ICD-10-CM | POA: Diagnosis not present

## 2017-02-18 NOTE — Progress Notes (Signed)
   Subjective:    Patient ID: Rhonda [name], female    DOB: 1960/12/06, 56 y.o.   MRN: [id number]  Patient presents for Back Pain (states that she has new area of pain in upper R side that catches and takes her breath away)    RIght sided mid/lower back pain-  Her previous back pain on the left side resolved with anti-inflammatory muscle relaxer. However for the past few days she's had pain on the right side. She feels a burning sensation and pain when she rolls over easily on that side. No particular injury. She has been using the same medications but this has not helped with the exception of not taking the hydrocodone. She states that she has to rash-like lesions on her back she is not sure they came from. She was concerned about shingles.  Review Of Systems:  GEN- denies fatigue, fever, weight loss,weakness, recent illness HEENT- denies eye drainage, change in vision, nasal discharge, CVS- denies chest pain, palpitations RESP- denies SOB, cough, wheeze ABD- denies N/V, change in stools, abd pain GU- denies dysuria, hematuria, dribbling, incontinence MSK- + joint pain,+ muscle aches, injury Neuro- denies headache, dizziness, syncope, seizure activity       Objective:    BP 122/68   Pulse 82   Temp 98 F (36.7 C) (Oral)   Resp 16   Ht 5\' 5"  (1.651 m)   Wt 198 lb (89.8 kg)   SpO2 97%   BMI 32.95 kg/m   GE-NAD,alert and oriented x 3  CVS- RRR, no murmur RESP-CTAB ABD-NABS,soft,NT,ND MSK- TTP lower thoracic ight paraspinals, no pasm,good ROM, lumbar spine NT, neg SLR  equal strenth LE Skin- liner erythemaouts lesions like scratches , no blisters  Neuro- normal tone, antalgiic gait, DTR symmetric EXT- No edema Pulses- Radial 2+        Assessment & Plan:      Problem List Items Addressed This Visit    None    Visit Diagnoses    Acute right-sided thoracic back pain    -  Primary   Previous pain resolved, does not appear to be shingles but interesting muscle relaxer  NSAID , topical muscle rubs not helping. Recently finished steroids. Advised Use the hydrocodone for pain at bedtime. I did give her instructions on how to use Valtrex that she does have some at home if she does pop out with any papules or blisters in the area. She is to take 1000 mg 3 times a day for 7 days. She is to continue with the anti-inflammatories for now as well. No red flags on exam       Note: This dictation was prepared with Dragon dictation along with smaller phrase technology. Any transcriptional errors that result from this process are unintentional.

## 2017-02-18 NOTE — Patient Instructions (Signed)
Valtrex 1000mg  ( take 2 of the 500mg ) three times a day for 1 week   Take the pain medication F/u as previous

## 2017-02-25 DIAGNOSIS — G5603 Carpal tunnel syndrome, bilateral upper limbs: Secondary | ICD-10-CM | POA: Diagnosis not present

## 2017-03-06 ENCOUNTER — Other Ambulatory Visit: Payer: Self-pay | Admitting: Family Medicine

## 2017-03-07 NOTE — Telephone Encounter (Signed)
Refill appropriate 

## 2017-03-09 DIAGNOSIS — D485 Neoplasm of uncertain behavior of skin: Secondary | ICD-10-CM | POA: Diagnosis not present

## 2017-03-11 ENCOUNTER — Other Ambulatory Visit: Payer: Self-pay | Admitting: Family Medicine

## 2017-03-20 ENCOUNTER — Other Ambulatory Visit: Payer: Self-pay | Admitting: Family Medicine

## 2017-03-21 NOTE — Telephone Encounter (Signed)
Medication refilled per protocol. 

## 2017-03-22 ENCOUNTER — Other Ambulatory Visit: Payer: Self-pay

## 2017-03-22 DIAGNOSIS — D2339 Other benign neoplasm of skin of other parts of face: Secondary | ICD-10-CM | POA: Diagnosis not present

## 2017-03-22 DIAGNOSIS — D485 Neoplasm of uncertain behavior of skin: Secondary | ICD-10-CM | POA: Diagnosis not present

## 2017-03-29 ENCOUNTER — Other Ambulatory Visit: Payer: Self-pay | Admitting: Family Medicine

## 2017-04-18 ENCOUNTER — Other Ambulatory Visit: Payer: Self-pay | Admitting: Family Medicine

## 2017-04-21 ENCOUNTER — Other Ambulatory Visit: Payer: Self-pay | Admitting: *Deleted

## 2017-04-21 DIAGNOSIS — I10 Essential (primary) hypertension: Secondary | ICD-10-CM

## 2017-04-21 MED ORDER — AMLODIPINE BESYLATE 5 MG PO TABS: 5.0000 mg | ORAL_TABLET | Freq: Every day | ORAL | 1 refills | Status: DC

## 2017-05-13 ENCOUNTER — Ambulatory Visit: Payer: BLUE CROSS/BLUE SHIELD | Admitting: Family Medicine

## 2017-05-13 ENCOUNTER — Other Ambulatory Visit: Payer: Self-pay

## 2017-05-13 ENCOUNTER — Encounter: Payer: Self-pay | Admitting: Family Medicine

## 2017-05-13 VITALS — BP 122/72 | HR 82 | Temp 98.6°F | Resp 16 | Ht 65.0 in | Wt 197.0 lb

## 2017-05-13 DIAGNOSIS — E7800 Pure hypercholesterolemia, unspecified: Secondary | ICD-10-CM | POA: Diagnosis not present

## 2017-05-13 DIAGNOSIS — E6609 Other obesity due to excess calories: Secondary | ICD-10-CM

## 2017-05-13 DIAGNOSIS — Z23 Encounter for immunization: Secondary | ICD-10-CM | POA: Diagnosis not present

## 2017-05-13 DIAGNOSIS — I10 Essential (primary) hypertension: Secondary | ICD-10-CM | POA: Diagnosis not present

## 2017-05-13 DIAGNOSIS — Z6832 Body mass index (BMI) 32.0-32.9, adult: Secondary | ICD-10-CM

## 2017-05-13 DIAGNOSIS — N951 Menopausal and female climacteric states: Secondary | ICD-10-CM

## 2017-05-13 DIAGNOSIS — M159 Polyosteoarthritis, unspecified: Secondary | ICD-10-CM

## 2017-05-13 MED ORDER — IBUPROFEN 600 MG PO TABS: 600.0000 mg | ORAL_TABLET | Freq: Three times a day (TID) | ORAL | 1 refills | Status: DC | PRN

## 2017-05-13 MED ORDER — PHENTERMINE HCL 37.5 MG PO TABS: 37.5000 mg | ORAL_TABLET | Freq: Every day | ORAL | 1 refills | Status: DC

## 2017-05-13 NOTE — Assessment & Plan Note (Signed)
Continue the Paxil

## 2017-05-13 NOTE — Assessment & Plan Note (Signed)
Well controlled , no changes , fasting labs at visit in Jan

## 2017-05-13 NOTE — Patient Instructions (Addendum)
Try the phentermine 1/2 tablet for 2 weeks, then increase to 1 tablet daily  F/U 1 week of January- Fasting for labs

## 2017-05-13 NOTE — Assessment & Plan Note (Signed)
We will add ibuprofen 600 mg as needed for arthritis pain.

## 2017-05-13 NOTE — Assessment & Plan Note (Signed)
Trial of phentermine BP controlled and no migraines in quite some time Discussed side effects of the medication.  She will start 1/2 tablet and then increase to 1 full tablet every 2 weeks.  Continue with her exercise program and her lower carb diet with healthy fats and protein.

## 2017-05-13 NOTE — Progress Notes (Signed)
   Subjective:    Patient ID: Rhonda [name], female    DOB: 03/16/1961, 56 y.o.   MRN: [id number]  Patient presents for Follow-up   Getting back into the exercise, but weight is not coming off , wants to try weight loss medication to help with her metabolism.  She tries to watch her carbs she uses Stevia as a sugar substitute.  She portions out her food is much as possible.  HTN-taking blood pressure medicine as prescribed no side effects, no headaches   Due for flu shot   Hot flashes she continues on Paxil  Generalized osteoarthritis she would like to try different anti-inflammatory.  Also her insurance does not cover the muscle relaxer    Review Of Systems:  GEN- denies fatigue, fever, weight loss,weakness, recent illness HEENT- denies eye drainage, change in vision, nasal discharge, CVS- denies chest pain, palpitations RESP- denies SOB, cough, wheeze ABD- denies N/V, change in stools, abd pain GU- denies dysuria, hematuria, dribbling, incontinence MSK- + joint pain, muscle aches, injury Neuro- denies headache, dizziness, syncope, seizure activity       Objective:    BP 122/72   Pulse 82   Temp 98.6 F (37 C) (Oral)   Resp 16   Ht 5\' 5"  (1.651 m)   Wt 197 lb (89.4 kg)   SpO2 98%   BMI 32.78 kg/m  GEN- NAD, alert and oriented x3 HEENT- PERRL, EOMI, non injected sclera, pink conjunctiva, MMM, oropharynx clear Neck- Supple, no thyromegaly CVS- RRR, no murmur RESP-CTAB ABD-NABS,soft,NT,ND EXT- No edema Pulses- Radial, DP- 2+        Assessment & Plan:      Problem List Items Addressed This Visit      Unprioritized   Hyperlipidemia   Obesity    Trial of phentermine BP controlled and no migraines in quite some time Discussed side effects of the medication.  She will start 1/2 tablet and then increase to 1 full tablet every 2 weeks.  Continue with her exercise program and her lower carb diet with healthy fats and protein.      Relevant Medications   phentermine (ADIPEX-P) 37.5 MG tablet   Menopausal hot flushes    Continue the Paxil      Generalized OA    We will add ibuprofen 600 mg as needed for arthritis pain.      Relevant Medications   ibuprofen (ADVIL,MOTRIN) 600 MG tablet   Essential hypertension, benign - Primary    Well controlled , no changes , fasting labs at visit in Jan       Other Visit Diagnoses    Need for immunization against influenza       Relevant Orders   Flu Vaccine QUAD 36+ mos IM (Completed)      Note: This dictation was prepared with Dragon dictation along with smaller phrase technology. Any transcriptional errors that result from this process are unintentional.

## 2017-05-17 ENCOUNTER — Telehealth: Payer: Self-pay | Admitting: *Deleted

## 2017-05-17 NOTE — Telephone Encounter (Signed)
Received request from pharmacy for PA on Phentermine ° °PA submitted.  ° °Dx: E66.09- obesity.  °

## 2017-05-17 NOTE — Telephone Encounter (Signed)
Your information has been submitted to [employer] [place]. [employer] [place] will review the request and fax you a determination directly, typically within 3 business days of your submission once all necessary information is received.  If [employer] [place] has not responded in 3 business days or if you have any questions about your submission, contact Blue Cross  at [phone]. 

## 2017-05-23 NOTE — Telephone Encounter (Signed)
Received PA determination.   PA approved 05/17/2017- 05/16/2017.  The authorization is limited to 12 weeks of therapy per year.   Pharmacy made aware.

## 2017-07-01 ENCOUNTER — Ambulatory Visit: Payer: BLUE CROSS/BLUE SHIELD | Admitting: Family Medicine

## 2017-07-11 ENCOUNTER — Ambulatory Visit: Payer: BLUE CROSS/BLUE SHIELD | Admitting: Family Medicine

## 2017-07-18 ENCOUNTER — Ambulatory Visit: Payer: BLUE CROSS/BLUE SHIELD | Admitting: Family Medicine

## 2017-07-18 ENCOUNTER — Other Ambulatory Visit: Payer: Self-pay

## 2017-07-18 ENCOUNTER — Encounter: Payer: Self-pay | Admitting: Family Medicine

## 2017-07-18 VITALS — BP 128/72 | HR 88 | Temp 98.4°F | Resp 14 | Ht 65.0 in | Wt 197.0 lb

## 2017-07-18 DIAGNOSIS — E7800 Pure hypercholesterolemia, unspecified: Secondary | ICD-10-CM

## 2017-07-18 DIAGNOSIS — E6609 Other obesity due to excess calories: Secondary | ICD-10-CM

## 2017-07-18 DIAGNOSIS — I10 Essential (primary) hypertension: Secondary | ICD-10-CM

## 2017-07-18 DIAGNOSIS — Z6832 Body mass index (BMI) 32.0-32.9, adult: Secondary | ICD-10-CM

## 2017-07-18 DIAGNOSIS — M1712 Unilateral primary osteoarthritis, left knee: Secondary | ICD-10-CM | POA: Diagnosis not present

## 2017-07-18 DIAGNOSIS — K5909 Other constipation: Secondary | ICD-10-CM

## 2017-07-18 DIAGNOSIS — L853 Xerosis cutis: Secondary | ICD-10-CM

## 2017-07-18 DIAGNOSIS — K149 Disease of tongue, unspecified: Secondary | ICD-10-CM | POA: Diagnosis not present

## 2017-07-18 DIAGNOSIS — K5900 Constipation, unspecified: Secondary | ICD-10-CM | POA: Diagnosis not present

## 2017-07-18 LAB — COMPREHENSIVE METABOLIC PANEL
AG RATIO: 1.6 (calc) (ref 1.0–2.5)
ALBUMIN MSPROF: 4.3 g/dL (ref 3.6–5.1)
ALT: 9 U/L (ref 6–29)
AST: 14 U/L (ref 10–35)
Alkaline phosphatase (APISO): 71 U/L (ref 33–130)
BUN: 15 mg/dL (ref 7–25)
CO2: 31 mmol/L (ref 20–32)
Calcium: 9.3 mg/dL (ref 8.6–10.4)
Chloride: 105 mmol/L (ref 98–110)
Creat: 0.82 mg/dL (ref 0.50–1.05)
Globulin: 2.7 g/dL (calc) (ref 1.9–3.7)
Glucose, Bld: 109 mg/dL — ABNORMAL HIGH (ref 65–99)
POTASSIUM: 4 mmol/L (ref 3.5–5.3)
SODIUM: 142 mmol/L (ref 135–146)
TOTAL PROTEIN: 7 g/dL (ref 6.1–8.1)
Total Bilirubin: 0.5 mg/dL (ref 0.2–1.2)

## 2017-07-18 LAB — CBC WITH DIFFERENTIAL/PLATELET
BASOS ABS: 39 {cells}/uL (ref 0–200)
Basophils Relative: 0.7 %
Eosinophils Absolute: 72 cells/uL (ref 15–500)
Eosinophils Relative: 1.3 %
HEMATOCRIT: 41.5 % (ref 35.0–45.0)
HEMOGLOBIN: 14.4 g/dL (ref 11.7–15.5)
LYMPHS ABS: 1931 {cells}/uL (ref 850–3900)
MCH: 30.4 pg (ref 27.0–33.0)
MCHC: 34.7 g/dL (ref 32.0–36.0)
MCV: 87.7 fL (ref 80.0–100.0)
MPV: 9.6 fL (ref 7.5–12.5)
Monocytes Relative: 10 %
NEUTROS ABS: 2910 {cells}/uL (ref 1500–7800)
Neutrophils Relative %: 52.9 %
Platelets: 266 10*3/uL (ref 140–400)
RBC: 4.73 10*6/uL (ref 3.80–5.10)
RDW: 13 % (ref 11.0–15.0)
Total Lymphocyte: 35.1 %
WBC: 5.5 10*3/uL (ref 3.8–10.8)
WBCMIX: 550 {cells}/uL (ref 200–950)

## 2017-07-18 LAB — LIPID PANEL
Cholesterol: 251 mg/dL — ABNORMAL HIGH (ref <200)
HDL: 77 mg/dL (ref >50)
LDL Cholesterol (Calc): 154 mg/dL (calc) — ABNORMAL HIGH
Non-HDL Cholesterol (Calc): 174 mg/dL (calc) — ABNORMAL HIGH (ref <130)
Total CHOL/HDL Ratio: 3.3 (calc) (ref <5.0)
Triglycerides: 93 mg/dL (ref <150)

## 2017-07-18 MED ORDER — LIRAGLUTIDE -WEIGHT MANAGEMENT 18 MG/3ML ~~LOC~~ SOPN: 0.6000 mg | PEN_INJECTOR | Freq: Every day | SUBCUTANEOUS | 3 refills | Status: DC

## 2017-07-18 MED ORDER — LINACLOTIDE 145 MCG PO CAPS: 145.0000 ug | ORAL_CAPSULE | Freq: Every day | ORAL | 1 refills | Status: DC

## 2017-07-18 MED ORDER — FIRST-DUKES MOUTHWASH MT SUSP: OROMUCOSAL | 0 refills | Status: DC

## 2017-07-18 NOTE — Patient Instructions (Addendum)
F/U 2 months  Start saxenda:  Week 1: 0.6mg  injected daily:   Week 2:  1.2 mg injected daily  Week 3:  1.8mg  injected daily  Week 4:  2.4mg  injected daily  Week 5:  3mg  injected daily Miralax daily, try the linzess samples  Magic mouthwash for tongue

## 2017-07-18 NOTE — Progress Notes (Signed)
   Subjective:    Patient ID: Rhonda [name], female    DOB: 02/16/1961, 57 y.o.   MRN: [id number]  Patient presents for Follow-up (weight- had to stop Phentermine)  Pt here to f/u chronic medical problems  Started on phentermine in Nov, but she stopped due to severe dry mouth felt like throat was closing up  , weight unchanged at 197lbs.  Gained 6 pounds since holidays, when she came off the medication.   Willing to try alternative She still feels like her tongue is irritated from the above symptoms.  No difficulty swallowing.   HTN- due for fasting labs, taking norvasc , atenonol, HCTZ without difficulty   Hyperlipidemia- trying to control with diet due for fasting labs  Left knee pain worsening, has known OA, but getting swelling, taking advil - needs referral back to ortho  Itchy dry skin, uses Aveenon  Constipation- hard bowels after she started phentermine  Review Of Systems:  GEN- denies fatigue, fever, weight loss,weakness, recent illness HEENT- denies eye drainage, change in vision, nasal discharge, CVS- denies chest pain, palpitations RESP- denies SOB, cough, wheeze ABD- denies N/V, change in stools, abd pain GU- denies dysuria, hematuria, dribbling, incontinence MSK- + joint pain, denies muscle aches, injury Neuro- denies headache, dizziness, syncope, seizure activity       Objective:    BP 128/72   Pulse 88   Temp 98.4 F (36.9 C) (Oral)   Resp 14   Ht 5\' 5"  (1.651 m)   Wt 197 lb (89.4 kg)   SpO2 96%   BMI 32.78 kg/m  GEN- NAD, alert and oriented x3 HEENT- PERRL, EOMI, non injected sclera, pink conjunctiva, MMM, oropharynx clear Neck- Supple, no thyromegaly CVS- RRR, no murmur RESP-CTAB ABD-NABS,soft,NT,ND MSK- Fair ROM left knee, mild effusion,. + crepitus, no wmarth, no erythema  Skin in tact no rash EXT- No edema Pulses- Radial, DP- 2+        Assessment & Plan:      Problem List Items Addressed This Visit      Unprioritized   Hyperlipidemia   Relevant Orders   Lipid panel   Obesity    Discussed Saxenda, pt shown how to use here in the office       Relevant Medications   Liraglutide -Weight Management (SAXENDA) 18 MG/3ML SOPN   Essential hypertension, benign - Primary    Controlled no changes,, fasting labs for lipids today as well       Relevant Orders   CBC with Differential/Platelet   Comprehensive metabolic panel   Chronic constipation    Given Linzess        Other Visit Diagnoses    Primary osteoarthritis of left knee       Referral to Dr. Delbert HarnessMurphy Wainer   Relevant Orders   Ambulatory referral to Orthopedic Surgery   Dry skin dermatitis       use Aveeno eczema  or golds bond    Constipation, unspecified constipation type       Trial of linzess with the miralax    Tongue irritation       Given [place] magic mouthwash      Note: This dictation was prepared with Dragon dictation along with smaller phrase technology. Any transcriptional errors that result from this process are unintentional.

## 2017-07-18 NOTE — Assessment & Plan Note (Signed)
Controlled no changes,, fasting labs for lipids today as well

## 2017-07-18 NOTE — Assessment & Plan Note (Signed)
Given Linzess

## 2017-07-18 NOTE — Assessment & Plan Note (Signed)
Discussed Saxenda, pt shown how to use here in the office

## 2017-07-21 ENCOUNTER — Telehealth: Payer: Self-pay | Admitting: *Deleted

## 2017-07-21 ENCOUNTER — Encounter: Payer: Self-pay | Admitting: *Deleted

## 2017-07-21 ENCOUNTER — Other Ambulatory Visit: Payer: Self-pay | Admitting: *Deleted

## 2017-07-21 MED ORDER — PAROXETINE HCL 10 MG PO TABS: 10.0000 mg | ORAL_TABLET | Freq: Every day | ORAL | 0 refills | Status: DC

## 2017-07-21 NOTE — Telephone Encounter (Signed)
Received request from pharmacy for PA on Saxenda.   PA submitted.   Dx: E66.09- obesity   

## 2017-07-21 NOTE — Telephone Encounter (Signed)
Your information has been submitted to [employer] [place]. [employer] [place] will review the request and fax you a determination directly, typically within 3 business days of your submission once all necessary information is received.  If [employer] [place] has not responded in 3 business days or if you have any questions about your submission, contact [employer] [place] at [phone]. 

## 2017-07-25 MED ORDER — LIRAGLUTIDE -WEIGHT MANAGEMENT 18 MG/3ML ~~LOC~~ SOPN: 0.6000 mg | PEN_INJECTOR | Freq: Every day | SUBCUTANEOUS | 3 refills | Status: DC

## 2017-07-25 MED ORDER — INSULIN PEN NEEDLE 32G X 6 MM MISC: 1 refills | Status: DC

## 2017-07-25 NOTE — Telephone Encounter (Signed)
Received PA determination.   PA [id number] approved 07/21/2017- 11/23/2017.  Pharmacy made aware.

## 2017-08-05 DIAGNOSIS — M25562 Pain in left knee: Secondary | ICD-10-CM | POA: Diagnosis not present

## 2017-08-14 DIAGNOSIS — J018 Other acute sinusitis: Secondary | ICD-10-CM | POA: Diagnosis not present

## 2017-08-17 DIAGNOSIS — M1712 Unilateral primary osteoarthritis, left knee: Secondary | ICD-10-CM | POA: Diagnosis not present

## 2017-08-20 ENCOUNTER — Other Ambulatory Visit: Payer: Self-pay | Admitting: Family Medicine

## 2017-08-26 DIAGNOSIS — M1712 Unilateral primary osteoarthritis, left knee: Secondary | ICD-10-CM | POA: Diagnosis not present

## 2017-08-31 DIAGNOSIS — M1712 Unilateral primary osteoarthritis, left knee: Secondary | ICD-10-CM | POA: Diagnosis not present

## 2017-09-02 ENCOUNTER — Telehealth: Payer: Self-pay | Admitting: Family Medicine

## 2017-09-02 NOTE — Telephone Encounter (Signed)
Samples available 

## 2017-09-02 NOTE — Telephone Encounter (Signed)
Patient is calling to get a sample of saxenda if possible  It is working well , but is 212.00 [phone]

## 2017-09-05 ENCOUNTER — Encounter: Payer: Self-pay | Admitting: Podiatry

## 2017-09-05 ENCOUNTER — Ambulatory Visit (INDEPENDENT_AMBULATORY_CARE_PROVIDER_SITE_OTHER): Payer: BLUE CROSS/BLUE SHIELD

## 2017-09-05 ENCOUNTER — Ambulatory Visit: Payer: BLUE CROSS/BLUE SHIELD | Admitting: Podiatry

## 2017-09-05 ENCOUNTER — Other Ambulatory Visit: Payer: Self-pay | Admitting: Podiatry

## 2017-09-05 DIAGNOSIS — S92401A Displaced unspecified fracture of right great toe, initial encounter for closed fracture: Secondary | ICD-10-CM

## 2017-09-05 DIAGNOSIS — M79671 Pain in right foot: Secondary | ICD-10-CM

## 2017-09-05 NOTE — Progress Notes (Signed)
Subjective:   Patient ID: Rhonda [name], female   DOB: 57 y.o.   MRN: [id number]   HPI Patient states that yesterday she hit her fifth toe right felt a crunch and states is been very sore since and she is not able to wear shoe.  Overall doing well with previous surgery we had done   ROS      Objective:  Physical Exam  Neurovascular status intact with patient's right fifth digit edematous with obvious trauma to the toe itself and no other forefoot pathology current     Assessment:  Strong probability for fracture of the right fifth digit     Plan:  H&P condition reviewed and recommended ice therapy and dispensed a surgical shoe to reduce the pressure on the toe.  If this continues for the next 4-6 weeks she will reappoint  X-ray indicates there is a possible fracture of the base of the fifth digit right with no indications of displacement or other pathology

## 2017-09-09 ENCOUNTER — Other Ambulatory Visit: Payer: Self-pay

## 2017-09-09 ENCOUNTER — Ambulatory Visit: Payer: BLUE CROSS/BLUE SHIELD | Admitting: Family Medicine

## 2017-09-09 ENCOUNTER — Encounter: Payer: Self-pay | Admitting: Family Medicine

## 2017-09-09 VITALS — BP 122/62 | HR 82 | Temp 98.5°F | Resp 14 | Ht 65.0 in | Wt 186.0 lb

## 2017-09-09 DIAGNOSIS — F320 Major depressive disorder, single episode, mild: Secondary | ICD-10-CM

## 2017-09-09 DIAGNOSIS — R7989 Other specified abnormal findings of blood chemistry: Secondary | ICD-10-CM | POA: Diagnosis not present

## 2017-09-09 DIAGNOSIS — R112 Nausea with vomiting, unspecified: Secondary | ICD-10-CM | POA: Diagnosis not present

## 2017-09-09 MED ORDER — DULOXETINE HCL 60 MG PO CPEP: 120.0000 mg | ORAL_CAPSULE | Freq: Every day | ORAL | 0 refills | Status: DC

## 2017-09-09 MED ORDER — PAROXETINE HCL 20 MG PO TABS: 20.0000 mg | ORAL_TABLET | Freq: Every day | ORAL | 2 refills | Status: DC

## 2017-09-09 NOTE — Progress Notes (Signed)
   Subjective:    Patient ID: Rhonda [name], female    DOB: 04/07/1961, 57 y.o.   MRN: [id number]  Patient presents for Medication Management (depression medication is ineffective- states that she is having increased depressio and fatigue-also states that weight loss medication is causing nausea daily)  Pt here with fever, congestion , pain in lymph nodes, was given doxycycline I with a few weeks ago.  Her symptoms have resolved.  The past week however she has had increased nausea after taking her Saxenda.  She is actually had emesis x2 nonbilious nonbloody.  She denies any abdominal pain with it.  Denies any change in her meals.  No known sick contacts.     Fatigue, feels down, no energy, for the past 2 months.  States in the past she was diagnosed with seasonal affective disorder years ago.  Feels the same way she did before.  She just has no interest in her typical hobbies when she comes home from work she just crashes states that she puts on during work like she is up the she is still getting everything done but just feels sad.  She cannot pinpoint anything particular.  Feels that things in general doing well with her job her home life and she is losing weight she does not know what she is feeling so down.  Currently on Cymbalta 60 mg twice a day and also on Paxil 10 mg   Review Of Systems:  GEN- + fatigue, fever, weight loss,weakness, recent illness HEENT- denies eye drainage, change in vision, nasal discharge, CVS- denies chest pain, palpitations RESP- denies SOB, cough, wheeze ABD- denies N/V, change in stools, abd pain GU- denies dysuria, hematuria, dribbling, incontinence MSK- denies joint pain, muscle aches, injury Neuro- denies headache, dizziness, syncope, seizure activity       Objective:    BP 122/62   Pulse 82   Temp 98.5 F (36.9 C) (Oral)   Resp 14   Ht 5\' 5"  (1.651 m)   Wt 186 lb (84.4 kg)   SpO2 98%   BMI 30.95 kg/m  GEN- NAD, alert and oriented x3 HEENT-  PERRL, EOMI, non injected sclera, pink conjunctiva, MMM, oropharynx clear Neck- Supple, no thyromegaly CVS- RRR, no murmur RESP-CTAB ABD-NABSD,soft,NT,ND, no SI  Psych- normal affect and mood         Assessment & Plan:      Problem List Items Addressed This Visit    None    Visit Diagnoses    Nausea and vomiting, intractability of vomiting not specified, unspecified vomiting type    -  Primary   Possible virus, but most likley due tomedication, decrease to 2.4mg  and see how she does, if still vomiting will have her stop the medication   Relevant Orders   TSH   Depression, major, single episode, mild (HCC)       Increase paxil to 20mg  once a day, continue cymbalta, we discussed she could also speak with therapist will see how she is doing in a few weeks   Relevant Medications   DULoxetine (CYMBALTA) 60 MG capsule   PARoxetine (PAXIL) 20 MG tablet   Other Relevant Orders   TSH      Note: This dictation was prepared with Dragon dictation along with smaller phrase technology. Any transcriptional errors that result from this process are unintentional.

## 2017-09-09 NOTE — Patient Instructions (Addendum)
Decrease to  2.4mg  on the Saxenda  Increase paxil to 20mg  once a day  Continue Cymbalta  F/U 4 weeks

## 2017-09-12 LAB — T4, FREE: Free T4: 1.2 ng/dL (ref 0.8–1.8)

## 2017-09-12 LAB — TEST AUTHORIZATION

## 2017-09-12 LAB — T3, FREE: T3 FREE: 3.1 pg/mL (ref 2.3–4.2)

## 2017-09-12 LAB — TSH: TSH: 0.27 mIU/L — ABNORMAL LOW

## 2017-09-14 DIAGNOSIS — Z9289 Personal history of other medical treatment: Secondary | ICD-10-CM | POA: Diagnosis not present

## 2017-09-14 DIAGNOSIS — J111 Influenza due to unidentified influenza virus with other respiratory manifestations: Secondary | ICD-10-CM | POA: Diagnosis not present

## 2017-09-14 DIAGNOSIS — Z20828 Contact with and (suspected) exposure to other viral communicable diseases: Secondary | ICD-10-CM | POA: Diagnosis not present

## 2017-09-14 DIAGNOSIS — J029 Acute pharyngitis, unspecified: Secondary | ICD-10-CM | POA: Diagnosis not present

## 2017-09-20 ENCOUNTER — Other Ambulatory Visit: Payer: Self-pay | Admitting: Family Medicine

## 2017-09-23 ENCOUNTER — Ambulatory Visit: Payer: BLUE CROSS/BLUE SHIELD | Admitting: Family Medicine

## 2017-10-07 ENCOUNTER — Ambulatory Visit: Payer: BLUE CROSS/BLUE SHIELD | Admitting: Family Medicine

## 2017-10-16 ENCOUNTER — Other Ambulatory Visit: Payer: Self-pay | Admitting: Family Medicine

## 2017-10-24 ENCOUNTER — Encounter: Payer: Self-pay | Admitting: Family Medicine

## 2017-10-24 ENCOUNTER — Ambulatory Visit: Payer: BLUE CROSS/BLUE SHIELD | Admitting: Family Medicine

## 2017-10-24 VITALS — BP 136/82 | HR 76 | Temp 98.2°F | Resp 16 | Ht 65.0 in | Wt 185.4 lb

## 2017-10-24 DIAGNOSIS — R7989 Other specified abnormal findings of blood chemistry: Secondary | ICD-10-CM | POA: Diagnosis not present

## 2017-10-24 DIAGNOSIS — F321 Major depressive disorder, single episode, moderate: Secondary | ICD-10-CM | POA: Diagnosis not present

## 2017-10-24 DIAGNOSIS — G43709 Chronic migraine without aura, not intractable, without status migrainosus: Secondary | ICD-10-CM | POA: Diagnosis not present

## 2017-10-24 DIAGNOSIS — E6609 Other obesity due to excess calories: Secondary | ICD-10-CM

## 2017-10-24 DIAGNOSIS — R946 Abnormal results of thyroid function studies: Secondary | ICD-10-CM | POA: Diagnosis not present

## 2017-10-24 DIAGNOSIS — Z6832 Body mass index (BMI) 32.0-32.9, adult: Secondary | ICD-10-CM

## 2017-10-24 LAB — T4, FREE: Free T4: 1.4 ng/dL (ref 0.8–1.8)

## 2017-10-24 LAB — T3, FREE: T3 FREE: 3.2 pg/mL (ref 2.3–4.2)

## 2017-10-24 LAB — TSH: TSH: 0.42 mIU/L

## 2017-10-24 MED ORDER — KETOROLAC TROMETHAMINE 60 MG/2ML IM SOLN
60.0000 mg | Freq: Once | INTRAMUSCULAR | Status: AC
  Administered 2017-10-24: 60 mg via INTRAMUSCULAR

## 2017-10-24 NOTE — Assessment & Plan Note (Addendum)
The Paxil 20 mg which she is getting benefit from.  Of note she did state that her family members were concerned that her eyes were brown.  She has not noted any difference in her eyes, advised she does have some brown spots but no juandice, those are not new She can schedule with eye doctor for any changes, there is no changes in her vision

## 2017-10-24 NOTE — Progress Notes (Signed)
Subjective:    Patient ID: Rhonda [name], female    DOB: Aug 02, 1960, 57 y.o.   MRN: [id number]  Patient presents for Follow-up  Depression-last visit paxil  Increased to  she does feel much better.  She was dealing with empty nest syndrome as her daughter has moved up [place].  She states that she now has some hobby she has been working on Fre[employer] her home and her energy level has improved.  Migraines-he has a headache feels like he may be turning to her typical migraine.  She took some Tylenol this morning.  Yesterday she was busy outside pulling weeds and very active starts to feel some strain in her neck now the headache is starting from her neck upwards.  Does not use her Zofran has very mild nausea that just started for her coming into the office.  Does have Imitrex at home however does not like the way it makes her feel so does not take regularly.   Obesity- taking Saxenda, weight at home 18lbs, now wears large shirts and size 14 -taking 2.4mg    Due for repeat TFT   Review Of Systems:  GEN- denies fatigue, fever, weight loss,weakness, recent illness HEENT- denies eye drainage, change in vision, nasal discharge, CVS- denies chest pain, palpitations RESP- denies SOB, cough, wheeze ABD- denies N/V, change in stools, abd pain GU- denies dysuria, hematuria, dribbling, incontinence MSK- denies joint pain, muscle aches, injury Neuro- denies headache, dizziness, syncope, seizure activity       Objective:    BP 136/82   Pulse 76   Temp 98.2 F (36.8 C)   Resp 16   Ht  (1.651 m)   Wt 185 lb 6.4 oz (84.1 kg)   SpO2 96%   BMI 30.85 kg/m  GEN- NAD, alert and oriented x3 HEENT- PERRL, EOMI, non injected sclera, few brown spots on sclera pink conjunctiva, MMM, oropharynx clear Neck- Supple, no thyromegaly CVS- RRR, no murmur RESP-CTAB Psych- normal affect and mood EXT- No edema Pulses- Radia  2+        Assessment & Plan:      Problem List Items Addressed  This Visit      Unprioritized   Obesity    She does have some difficulty because of her insurance does not cover the Saxenda.  She did request a sample she is trying to pay for this out of pocket. She is to continue the 2.4 mg her nausea resolved when she decreased her dose. I think she continues to get benefit from the medication      Migraines - Primary    Given toradol injection in house Can take her zofran as needed      Relevant Medications   ketorolac (TORADOL) injection 60 mg (Completed)   Depression, major, single episode, moderate (HCC)    The Paxil 20 mg which she is getting benefit from.  Of note she did state that her family members were concerned that her eyes were brown.  She has not noted any difference in her eyes, advised she does have some brown spots but no juandice, those are not new She can schedule with eye doctor for any changes, there is no changes in her vision       Other Visit Diagnoses    Abnormal TSH       Relevant Orders   TSH   T3, free   T4, free      Note: This dictation was prepared with Dragon dictation along  with smaller phrase technology. Any transcriptional errors that result from this process are unintentional.

## 2017-10-24 NOTE — Assessment & Plan Note (Signed)
She does have some difficulty because of her insurance does not cover the Saxenda.  She did request a sample she is trying to pay for this out of pocket. She is to continue the 2.4 mg her nausea resolved when she decreased her dose. I think she continues to get benefit from the medication

## 2017-10-24 NOTE — Assessment & Plan Note (Signed)
Given toradol injection in house Can take her zofran as needed

## 2017-10-24 NOTE — Patient Instructions (Addendum)
We will call with lab results  Toradol shot  F/U 3 months

## 2017-10-26 ENCOUNTER — Telehealth: Payer: Self-pay | Admitting: *Deleted

## 2017-10-26 NOTE — Telephone Encounter (Signed)
Received call from patient.   Reports that she forgot to mention that she removed small tick with white spot on it's back about 2 weeks prior from her leg. States that she continues to have HA, especially in the back of her head.   MD made aware and advised to continue to monitor. If fever or rash noted, schedule OV.   Call placed to patient and patient made aware per VM.

## 2017-11-09 ENCOUNTER — Other Ambulatory Visit: Payer: Self-pay | Admitting: Family Medicine

## 2017-11-09 DIAGNOSIS — I10 Essential (primary) hypertension: Secondary | ICD-10-CM

## 2017-11-18 ENCOUNTER — Telehealth: Payer: Self-pay | Admitting: *Deleted

## 2017-11-18 NOTE — Telephone Encounter (Signed)
Received request from pharmacy for PA on   PA submitted.   Dx: E66.09- obesity  07/19/2017: 197lb 10/24/2017: 185lb 6% weight reduction

## 2017-11-18 NOTE — Telephone Encounter (Signed)
Your information has been submitted to [employer] [place]. [employer] [place] will review the request and fax you a determination directly, typically within 3 business days of your submission once all necessary information is received.  If [employer] Cross [place] has not responded in 3 business days or if you have any questions about your submission, contact [employer] [place] at [phone]. 

## 2017-11-22 MED ORDER — LIRAGLUTIDE -WEIGHT MANAGEMENT 18 MG/3ML ~~LOC~~ SOPN: 0.6000 mg | PEN_INJECTOR | Freq: Every day | SUBCUTANEOUS | 3 refills | Status: DC

## 2017-11-22 NOTE — Telephone Encounter (Signed)
Received PA determination.   PA approved 11/18/2017- 11/17/2018.  Pharmacy made aware.

## 2018-01-20 ENCOUNTER — Other Ambulatory Visit: Payer: Self-pay | Admitting: Family Medicine

## 2018-01-20 MED ORDER — PAROXETINE HCL 20 MG PO TABS: 20.0000 mg | ORAL_TABLET | Freq: Every day | ORAL | 2 refills | Status: DC

## 2018-01-24 ENCOUNTER — Ambulatory Visit: Payer: BLUE CROSS/BLUE SHIELD | Admitting: Family Medicine

## 2018-01-25 ENCOUNTER — Encounter: Payer: Self-pay | Admitting: Family Medicine

## 2018-02-01 ENCOUNTER — Encounter: Payer: Self-pay | Admitting: Family Medicine

## 2018-02-01 ENCOUNTER — Ambulatory Visit: Payer: BLUE CROSS/BLUE SHIELD | Admitting: Family Medicine

## 2018-02-01 ENCOUNTER — Other Ambulatory Visit: Payer: Self-pay

## 2018-02-01 VITALS — BP 130/72 | HR 70 | Temp 98.4°F | Resp 14 | Ht 65.0 in | Wt 183.0 lb

## 2018-02-01 DIAGNOSIS — Z6832 Body mass index (BMI) 32.0-32.9, adult: Secondary | ICD-10-CM | POA: Diagnosis not present

## 2018-02-01 DIAGNOSIS — F321 Major depressive disorder, single episode, moderate: Secondary | ICD-10-CM

## 2018-02-01 DIAGNOSIS — E6609 Other obesity due to excess calories: Secondary | ICD-10-CM

## 2018-02-01 DIAGNOSIS — I10 Essential (primary) hypertension: Secondary | ICD-10-CM | POA: Diagnosis not present

## 2018-02-01 MED ORDER — DULOXETINE HCL 60 MG PO CPEP: 120.0000 mg | ORAL_CAPSULE | Freq: Every day | ORAL | 2 refills | Status: DC

## 2018-02-01 MED ORDER — PAROXETINE HCL 20 MG PO TABS: 20.0000 mg | ORAL_TABLET | Freq: Every day | ORAL | 2 refills | Status: DC

## 2018-02-01 NOTE — Progress Notes (Signed)
   Subjective:    Patient ID: Rhonda [name], female    DOB: 04/10/1961, 57 y.o.   MRN: [id number]  Patient presents for Follow-up (is not fasting)   Doing well on paxil, doesn't feel depressed or sad, has more energy, she is exercising some   Continues with low carb diet and saxenda - needs samples due to cost     HTN- taking norvasc no SE  Due for fasting labs, will return in the morning   Review Of Systems:  GEN- denies fatigue, fever, weight loss,weakness, recent illness HEENT- denies eye drainage, change in vision, nasal discharge, CVS- denies chest pain, palpitations RESP- denies SOB, cough, wheeze ABD- denies N/V, change in stools, abd pain GU- denies dysuria, hematuria, dribbling, incontinence MSK- denies joint pain, muscle aches, injury Neuro- denies headache, dizziness, syncope, seizure activity       Objective:    BP 130/72   Pulse 70   Temp 98.4 F (36.9 C) (Oral)   Resp 14   Ht 5\' 5"  (1.651 m)   Wt 183 lb (83 kg)   SpO2 99%   BMI 30.45 kg/m  GEN- NAD, alert and oriented x3 HEENT- PERRL, EOMI, non injected sclera, pink conjunctiva, MMM, oropharynx clear Neck- Supple, no thyromegaly CVS- RRR, no murmur RESP-CTAB Psych- normal affect and mood EXT- No edema Pulses- Radial, DP- 2+        Assessment & Plan:      Problem List Items Addressed This Visit      Unprioritized   Depression, major, single episode, moderate (HCC)    No change to paxil, doing well      Relevant Medications   PARoxetine (PAXIL) 20 MG tablet   DULoxetine (CYMBALTA) 60 MG capsule   Essential hypertension, benign - Primary    Well controlled no changes      Obesity    Continues to benefit from [place], continue medication         Note: This dictation was prepared with Dragon dictation along with smaller phrase technology. Any transcriptional errors that result from this process are unintentional.

## 2018-02-01 NOTE — Patient Instructions (Addendum)
Return for fasting labs tomorrow morning  F/U schedule a physical

## 2018-02-02 ENCOUNTER — Encounter: Payer: Self-pay | Admitting: Family Medicine

## 2018-02-02 ENCOUNTER — Other Ambulatory Visit: Payer: BLUE CROSS/BLUE SHIELD

## 2018-02-02 DIAGNOSIS — R7989 Other specified abnormal findings of blood chemistry: Secondary | ICD-10-CM | POA: Diagnosis not present

## 2018-02-02 DIAGNOSIS — E7800 Pure hypercholesterolemia, unspecified: Secondary | ICD-10-CM

## 2018-02-02 DIAGNOSIS — I10 Essential (primary) hypertension: Secondary | ICD-10-CM | POA: Diagnosis not present

## 2018-02-02 DIAGNOSIS — Z0000 Encounter for general adult medical examination without abnormal findings: Secondary | ICD-10-CM

## 2018-02-02 LAB — LIPID PANEL
CHOL/HDL RATIO: 3.6 (calc) (ref <5.0)
CHOLESTEROL: 241 mg/dL — AB (ref <200)
HDL: 67 mg/dL (ref >50)
LDL CHOLESTEROL (CALC): 160 mg/dL — AB
NON-HDL CHOLESTEROL (CALC): 174 mg/dL — AB (ref <130)
Triglycerides: 45 mg/dL (ref <150)

## 2018-02-02 LAB — CBC WITH DIFFERENTIAL/PLATELET
BASOS ABS: 32 {cells}/uL (ref 0–200)
BASOS PCT: 0.7 %
EOS PCT: 2 %
Eosinophils Absolute: 90 cells/uL (ref 15–500)
HEMATOCRIT: 40.3 % (ref 35.0–45.0)
HEMOGLOBIN: 14.2 g/dL (ref 11.7–15.5)
LYMPHS ABS: 1638 {cells}/uL (ref 850–3900)
MCH: 30.6 pg (ref 27.0–33.0)
MCHC: 35.2 g/dL (ref 32.0–36.0)
MCV: 86.9 fL (ref 80.0–100.0)
MONOS PCT: 11.4 %
MPV: 9.7 fL (ref 7.5–12.5)
NEUTROS ABS: 2228 {cells}/uL (ref 1500–7800)
Neutrophils Relative %: 49.5 %
Platelets: 252 10*3/uL (ref 140–400)
RBC: 4.64 10*6/uL (ref 3.80–5.10)
RDW: 13 % (ref 11.0–15.0)
Total Lymphocyte: 36.4 %
WBC mixed population: 513 cells/uL (ref 200–950)
WBC: 4.5 10*3/uL (ref 3.8–10.8)

## 2018-02-02 LAB — COMPLETE METABOLIC PANEL WITH GFR
AG Ratio: 1.7 (calc) (ref 1.0–2.5)
ALBUMIN MSPROF: 4.3 g/dL (ref 3.6–5.1)
ALT: 10 U/L (ref 6–29)
AST: 18 U/L (ref 10–35)
Alkaline phosphatase (APISO): 64 U/L (ref 33–130)
BUN: 16 mg/dL (ref 7–25)
CALCIUM: 9.5 mg/dL (ref 8.6–10.4)
CO2: 30 mmol/L (ref 20–32)
CREATININE: 0.76 mg/dL (ref 0.50–1.05)
Chloride: 101 mmol/L (ref 98–110)
GFR, EST AFRICAN AMERICAN: 102 mL/min/{1.73_m2} (ref >=60)
GFR, EST NON AFRICAN AMERICAN: 88 mL/min/{1.73_m2} (ref >=60)
GLOBULIN: 2.6 g/dL (ref 1.9–3.7)
GLUCOSE: 89 mg/dL (ref 65–99)
Potassium: 3.9 mmol/L (ref 3.5–5.3)
SODIUM: 141 mmol/L (ref 135–146)
TOTAL PROTEIN: 6.9 g/dL (ref 6.1–8.1)
Total Bilirubin: 0.6 mg/dL (ref 0.2–1.2)

## 2018-02-02 LAB — TSH: TSH: 0.39 m[IU]/L — AB

## 2018-02-02 NOTE — Assessment & Plan Note (Signed)
Continues to benefit from [place], continue medication

## 2018-02-02 NOTE — Assessment & Plan Note (Signed)
Well controlled no changes 

## 2018-02-02 NOTE — Assessment & Plan Note (Signed)
No change to paxil, doing well

## 2018-02-11 ENCOUNTER — Other Ambulatory Visit: Payer: Self-pay | Admitting: Family Medicine

## 2018-02-22 ENCOUNTER — Other Ambulatory Visit: Payer: Self-pay

## 2018-02-22 ENCOUNTER — Encounter: Payer: Self-pay | Admitting: Physician Assistant

## 2018-02-22 ENCOUNTER — Ambulatory Visit (INDEPENDENT_AMBULATORY_CARE_PROVIDER_SITE_OTHER): Payer: BLUE CROSS/BLUE SHIELD | Admitting: Physician Assistant

## 2018-02-22 VITALS — BP 136/82 | HR 80 | Temp 98.3°F | Resp 18 | Ht 63.5 in | Wt 182.0 lb

## 2018-02-22 DIAGNOSIS — Z0000 Encounter for general adult medical examination without abnormal findings: Secondary | ICD-10-CM

## 2018-02-22 NOTE — Progress Notes (Signed)
Patient ID: Rhonda [name] MRN: [id number], DOB: 01/09/1961, 57 y.o. Date of Encounter: 02/22/2018,   Chief Complaint: Physical (CPE)  HPI: 57 y.o. y/o female  here for CPE.   Dr Rhonda [name] is her PCP.  However Dr. Jeanice [name] is now out on maternity leave so the patient scheduled with me for this visit today. Also Rhonda [name] just recently had a visit with Dr. Jeanice [name] on 02/01/2018. Rhonda [name] has a form with her that needs to be completed.  Needs updated physical as well. Form is titled to "medical evaluation [employer] social services ". 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 She reports that she does not think that they had she has to do the TB skin test again since she does only care for her son and is not working with others.  States that she will verify this and will return for the TB test if it is necessary. No other specific concerns to address today.  Reports that she does see a gynecologist routinely.  States that her next appointment with GYN is already scheduled for December.  Review of Systems: Consitutional: No fever, chills, fatigue, night sweats, lymphadenopathy. No significant/unexplained weight changes. Eyes: No visual changes, eye redness, or discharge. ENT/Mouth: No ear pain, sore throat, nasal drainage, or sinus pain. Cardiovascular: No chest pressure,heaviness, tightness or squeezing, even with exertion. No increased shortness of breath or dyspnea on exertion.No palpitations, edema, orthopnea, PND. Respiratory: No cough, hemoptysis, SOB, or wheezing. Gastrointestinal: No anorexia, dysphagia, reflux, pain, nausea, vomiting, hematemesis, diarrhea, constipation, BRBPR, or melena. Breast: No mass, nodules, bulging, or retraction. No skin changes or inflammation. No nipple discharge. No  lymphadenopathy. Genitourinary: No dysuria, hematuria, incontinence, vaginal discharge, pruritis, burning, abnormal bleeding, or pain. Musculoskeletal: No decreased ROM, No joint pain or swelling. No significant pain in neck, back, or extremities. Skin: No rash, pruritis, or concerning lesions. Neurological: No headache, dizziness, syncope, seizures, tremors, memory loss, coordination problems, or paresthesias. Psychological: No hallucinations, SI/HI. Endocrine: No polydipsia, polyphagia, polyuria, or known diabetes.No increased fatigue. No palpitations/rapid heart rate. No significant/unexplained weight change. All other systems were reviewed and are otherwise negative.  Past Medical History:  Diagnosis Date  . Arthritis    knees  . Carpal tunnel syndrome of left wrist 07/2016  . Depression   . History of MRSA infection > 10 years   right leg  . Hypertension    states under control with meds., has been on med. > 2 yr.     Past Surgical History:  Procedure Laterality Date  . ABDOMINAL HYSTERECTOMY     partial  . BREAST EXCISIONAL BIOPSY    . CARPAL TUNNEL RELEASE Left 08/26/2016   Procedure: LEFT WRIST CARPAL TUNNEL RELEASE;  Surgeon: Loreta Aveaniel F Murphy, MD;  Location: [facility];  Service: Orthopedics;  Laterality: Left;  . COLONOSCOPY WITH PROPOFOL  03/20/2014  . LAPAROSCOPIC UNILATERAL SALPINGECTOMY Right 05/18/2002  . OVARY BIOPSY Right 05/18/2002  . TRIGGER FINGER RELEASE Left 02/10/2017   Procedure: LEFT TRIGGER THUMB RELEASE (TENDON SHEATH INCISION);  Surgeon: Loreta AveMurphy, Daniel F, MD;  Location: [facility];  Service: Orthopedics;  Laterality: Left;    Home Meds:  Outpatient Medications Prior to Visit  Medication Sig Dispense Refill  . amLODipine (NORVASC) 5 MG tablet TAKE 1 TABLET BY MOUTH  EVERY DAY 90 tablet 1  . atenolol (TENORMIN) 50 MG tablet TAKE 1 TABLET (50 MG TOTAL) BY MOUTH DAILY. 30 tablet 4  . BIOTIN PO Take 1 tablet by mouth daily.    .  cholecalciferol (VITAMIN D) 1000 units tablet Take 1,000 Units by mouth daily.    . clotrimazole-betamethasone (LOTRISONE) cream Apply 1 application topically 2 (two) times daily. 30 g 0  . DULoxetine (CYMBALTA) 60 MG capsule Take 2 capsules (120 mg total) by mouth daily. 180 capsule 2  . hydrochlorothiazide (HYDRODIURIL) 25 MG tablet TAKE 1 TABLET (25 MG TOTAL) BY MOUTH DAILY. 90 tablet 2  . Insulin Pen Needle (BD PEN NEEDLE MICRO U/F) 32G X 6 MM MISC Use as directed with Saxenda QD. 100 each 1  . LINZESS 145 MCG CAPS capsule TAKE 1 CAPSULE (145 MCG TOTAL) BY MOUTH DAILY BEFORE BREAKFAST. 90 capsule 1  . Liraglutide -Weight Management (SAXENDA) 18 MG/3ML SOPN Inject 0.6 mg into the skin daily. Increase to max of 3mg  daily 5 pen 3  . PARoxetine (PAXIL) 20 MG tablet Take 1 tablet (20 mg total) by mouth daily. 90 tablet 2  . valACYclovir (VALTREX) 500 MG tablet Take 500 mg by mouth 2 (two) times daily.     No facility-administered medications prior to visit.     Allergies:  Allergies  Allergen Reactions  . Penicillins Hives        . Statins Other (See Comments)    MYALGIAS  . Lisinopril Cough    Social History   Socioeconomic History  . Marital status: Single    Spouse name: Not on file  . Number of children: Not on file  . Years of education: Not on file  . Highest education level: Not on file  Occupational History  . Not on file  Social Needs  . Financial resource strain: Not on file  . Food insecurity:    Worry: Not on file    Inability: Not on file  . Transportation needs:    Medical: Not on file    Non-medical: Not on file  Tobacco Use  . Smoking status: Never Smoker  . Smokeless tobacco: Never Used  Substance and Sexual Activity  . Alcohol use: No  . Drug use: No  . Sexual activity: Not on file  Lifestyle  . Physical activity:    Days per week: Not on file    Minutes per session: Not on file  . Stress: Not on file  Relationships  . Social connections:     Talks on phone: Not on file    Gets together: Not on file    Attends religious service: Not on file    Active member of club or organization: Not on file    Attends meetings of clubs or organizations: Not on file    Relationship status: Not on file  . Intimate partner violence:    Fear of current or ex partner: Not on file    Emotionally abused: Not on file    Physically abused: Not on file    Forced sexual activity: Not on file  Other Topics Concern  . Not on file  Social History Narrative  . Not on file    Family History  Problem Relation Age of Onset  . Breast cancer Maternal Aunt     Physical Exam: Blood pressure 136/82, pulse 80, temperature 98.3 F (36.8 C), temperature source Oral, resp. rate 18, height 5' 3.5" (1.613 m), weight 82.6 kg, SpO2 95 %., Body  mass index is 31.73 kg/m. General: Well developed, well nourished AAF. Appears in no acute distress. HEENT: Normocephalic, atraumatic. Conjunctiva pink, sclera non-icteric. Pupils 2 mm constricting to 1 mm, round, regular, and equally reactive to light and accomodation. EOMI. Internal auditory canal clear. TMs with good cone of light and without pathology. Nasal mucosa pink. Nares are without discharge. No sinus tenderness. Oral mucosa pink. Neck: Supple. Trachea midline. No thyromegaly. Full ROM. No lymphadenopathy.No Carotid Bruits. Lungs: Clear to auscultation bilaterally without wheezes, rales, or rhonchi. Breathing is of normal effort and unlabored. Cardiovascular: RRR with S1 S2. No murmurs, rubs, or gallops. Distal pulses 2+ symmetrically. No carotid or abdominal bruits. Breast: Per Gyn Abdomen: Soft, non-tender, non-distended with normoactive bowel sounds. No hepatosplenomegaly or masses. No rebound/guarding. No CVA tenderness. No hernias.  Genitourinary:  Per Gyn Musculoskeletal: Full range of motion and 5/5 strength throughout.  Skin: Warm and moist without erythema, ecchymosis, wounds, or rash. Neuro: A+Ox3. CN  II-XII grossly intact. Moves all extremities spontaneously. Full sensation throughout. Normal gait.  Psych:  Responds to questions appropriately with a normal affect.   Assessment/Plan:  57 y.o. y/o female here for CPE  1. Encounter for preventive health examination  A. Screening Labs: She had labs on 02/02/2018 that included CBC, CME T, FLP, TSH.  These have already been followed up by Dr. Jeanice Lim.  B. Pap: She sees GYN routinely.  Has next visit scheduled for December.  C. Screening Mammogram: Reviewed with her that last mammogram was 09/02/2016.  She is aware that she needs to schedule this and will follow up with scheduling mammogram.  D. DEXA/BMD:  Not indicated until closer to age 29  E. Colorectal Cancer Screening: She had colonoscopy 03/20/2014.  Normal.  Repeat 10 years.  F. Immunizations:  Influenza:  N/A Tetanus: Vaccine documented in our record.  She states that she knows this is up-to-date through her agency.  I asked her to get that information to our office so we can document. Pneumococcal: She has no indication to require pneumonia vaccine until age 30 Shingrix: she has not received Shingrix so far.  Today I did discuss this.  She is interested in getting this vaccine.  Says that her mother had shingles and it was very painful.  She is to contact her insurance regarding cost/coverage and if she wants to proceed will go to pharmacy to get this vaccine.  Her form is completed today. All history questions pertaining to TB screening were negative.  However if her agency does require TB test and she will return for that lab.  [address] [place], [place], [facility] 02/22/2018 8:31 AM

## 2018-04-18 ENCOUNTER — Other Ambulatory Visit: Payer: Self-pay | Admitting: Family Medicine

## 2018-05-02 ENCOUNTER — Other Ambulatory Visit: Payer: Self-pay | Admitting: Family Medicine

## 2018-05-02 DIAGNOSIS — Z1231 Encounter for screening mammogram for malignant neoplasm of breast: Secondary | ICD-10-CM

## 2018-05-03 DIAGNOSIS — M25512 Pain in left shoulder: Secondary | ICD-10-CM | POA: Diagnosis not present

## 2018-05-03 DIAGNOSIS — M25522 Pain in left elbow: Secondary | ICD-10-CM | POA: Diagnosis not present

## 2018-05-03 DIAGNOSIS — M542 Cervicalgia: Secondary | ICD-10-CM | POA: Diagnosis not present

## 2018-05-20 ENCOUNTER — Other Ambulatory Visit: Payer: Self-pay | Admitting: Family Medicine

## 2018-05-20 DIAGNOSIS — I10 Essential (primary) hypertension: Secondary | ICD-10-CM

## 2018-05-31 DIAGNOSIS — M25512 Pain in left shoulder: Secondary | ICD-10-CM | POA: Diagnosis not present

## 2018-06-01 ENCOUNTER — Other Ambulatory Visit: Payer: Self-pay | Admitting: Orthopedic Surgery

## 2018-06-01 DIAGNOSIS — G8929 Other chronic pain: Secondary | ICD-10-CM

## 2018-06-01 DIAGNOSIS — M25512 Pain in left shoulder: Principal | ICD-10-CM

## 2018-06-11 ENCOUNTER — Ambulatory Visit
Admission: RE | Admit: 2018-06-11 | Discharge: 2018-06-11 | Disposition: A | Discharge status: Home / Self Care | Payer: BLUE CROSS/BLUE SHIELD | Source: Ambulatory Visit | Attending: Orthopedic Surgery | Admitting: Orthopedic Surgery

## 2018-06-11 DIAGNOSIS — M75122 Complete rotator cuff tear or rupture of left shoulder, not specified as traumatic: Secondary | ICD-10-CM | POA: Diagnosis not present

## 2018-06-11 DIAGNOSIS — G8929 Other chronic pain: Secondary | ICD-10-CM

## 2018-06-11 DIAGNOSIS — M25512 Pain in left shoulder: Principal | ICD-10-CM

## 2018-06-12 ENCOUNTER — Other Ambulatory Visit: Payer: Self-pay | Admitting: Orthopedic Surgery

## 2018-06-12 DIAGNOSIS — J029 Acute pharyngitis, unspecified: Secondary | ICD-10-CM | POA: Diagnosis not present

## 2018-06-12 DIAGNOSIS — M791 Myalgia, unspecified site: Secondary | ICD-10-CM | POA: Diagnosis not present

## 2018-06-12 DIAGNOSIS — L739 Follicular disorder, unspecified: Secondary | ICD-10-CM | POA: Diagnosis not present

## 2018-06-12 DIAGNOSIS — I10 Essential (primary) hypertension: Secondary | ICD-10-CM | POA: Diagnosis not present

## 2018-06-14 ENCOUNTER — Other Ambulatory Visit: Payer: Self-pay | Admitting: Family Medicine

## 2018-06-14 DIAGNOSIS — M25512 Pain in left shoulder: Secondary | ICD-10-CM | POA: Diagnosis not present

## 2018-06-15 ENCOUNTER — Ambulatory Visit: Payer: BLUE CROSS/BLUE SHIELD

## 2018-06-27 DIAGNOSIS — Y999 Unspecified external cause status: Secondary | ICD-10-CM | POA: Diagnosis not present

## 2018-06-27 DIAGNOSIS — G8918 Other acute postprocedural pain: Secondary | ICD-10-CM | POA: Diagnosis not present

## 2018-06-27 DIAGNOSIS — M24112 Other articular cartilage disorders, left shoulder: Secondary | ICD-10-CM | POA: Diagnosis not present

## 2018-06-27 DIAGNOSIS — S46012A Strain of muscle(s) and tendon(s) of the rotator cuff of left shoulder, initial encounter: Secondary | ICD-10-CM | POA: Diagnosis not present

## 2018-06-27 DIAGNOSIS — M7542 Impingement syndrome of left shoulder: Secondary | ICD-10-CM | POA: Diagnosis not present

## 2018-06-27 DIAGNOSIS — X58XXXA Exposure to other specified factors, initial encounter: Secondary | ICD-10-CM | POA: Diagnosis not present

## 2018-06-27 DIAGNOSIS — M19012 Primary osteoarthritis, left shoulder: Secondary | ICD-10-CM | POA: Diagnosis not present

## 2018-06-27 DIAGNOSIS — M7552 Bursitis of left shoulder: Secondary | ICD-10-CM | POA: Diagnosis not present

## 2018-06-27 DIAGNOSIS — M7522 Bicipital tendinitis, left shoulder: Secondary | ICD-10-CM | POA: Diagnosis not present

## 2018-06-27 DIAGNOSIS — M75122 Complete rotator cuff tear or rupture of left shoulder, not specified as traumatic: Secondary | ICD-10-CM | POA: Diagnosis not present

## 2018-07-07 DIAGNOSIS — M19012 Primary osteoarthritis, left shoulder: Secondary | ICD-10-CM | POA: Diagnosis not present

## 2018-07-10 DIAGNOSIS — S46112D Strain of muscle, fascia and tendon of long head of biceps, left arm, subsequent encounter: Secondary | ICD-10-CM | POA: Diagnosis not present

## 2018-07-10 DIAGNOSIS — M75102 Unspecified rotator cuff tear or rupture of left shoulder, not specified as traumatic: Secondary | ICD-10-CM | POA: Diagnosis not present

## 2018-07-10 DIAGNOSIS — M25612 Stiffness of left shoulder, not elsewhere classified: Secondary | ICD-10-CM | POA: Diagnosis not present

## 2018-07-10 DIAGNOSIS — M7542 Impingement syndrome of left shoulder: Secondary | ICD-10-CM | POA: Diagnosis not present

## 2018-07-12 DIAGNOSIS — S46112D Strain of muscle, fascia and tendon of long head of biceps, left arm, subsequent encounter: Secondary | ICD-10-CM | POA: Diagnosis not present

## 2018-07-12 DIAGNOSIS — M75102 Unspecified rotator cuff tear or rupture of left shoulder, not specified as traumatic: Secondary | ICD-10-CM | POA: Diagnosis not present

## 2018-07-12 DIAGNOSIS — M7542 Impingement syndrome of left shoulder: Secondary | ICD-10-CM | POA: Diagnosis not present

## 2018-07-12 DIAGNOSIS — M25612 Stiffness of left shoulder, not elsewhere classified: Secondary | ICD-10-CM | POA: Diagnosis not present

## 2018-07-19 DIAGNOSIS — S46112D Strain of muscle, fascia and tendon of long head of biceps, left arm, subsequent encounter: Secondary | ICD-10-CM | POA: Diagnosis not present

## 2018-07-19 DIAGNOSIS — M25612 Stiffness of left shoulder, not elsewhere classified: Secondary | ICD-10-CM | POA: Diagnosis not present

## 2018-07-19 DIAGNOSIS — M75102 Unspecified rotator cuff tear or rupture of left shoulder, not specified as traumatic: Secondary | ICD-10-CM | POA: Diagnosis not present

## 2018-07-19 DIAGNOSIS — M7542 Impingement syndrome of left shoulder: Secondary | ICD-10-CM | POA: Diagnosis not present

## 2018-07-22 ENCOUNTER — Other Ambulatory Visit: Payer: Self-pay | Admitting: Family Medicine

## 2018-07-22 DIAGNOSIS — I10 Essential (primary) hypertension: Secondary | ICD-10-CM

## 2018-07-24 ENCOUNTER — Other Ambulatory Visit: Payer: Self-pay | Admitting: Family Medicine

## 2018-07-24 DIAGNOSIS — S46112D Strain of muscle, fascia and tendon of long head of biceps, left arm, subsequent encounter: Secondary | ICD-10-CM | POA: Diagnosis not present

## 2018-07-24 DIAGNOSIS — M7542 Impingement syndrome of left shoulder: Secondary | ICD-10-CM | POA: Diagnosis not present

## 2018-07-24 DIAGNOSIS — M25612 Stiffness of left shoulder, not elsewhere classified: Secondary | ICD-10-CM | POA: Diagnosis not present

## 2018-07-24 DIAGNOSIS — M75102 Unspecified rotator cuff tear or rupture of left shoulder, not specified as traumatic: Secondary | ICD-10-CM | POA: Diagnosis not present

## 2018-07-26 DIAGNOSIS — M7542 Impingement syndrome of left shoulder: Secondary | ICD-10-CM | POA: Diagnosis not present

## 2018-07-26 DIAGNOSIS — S46112D Strain of muscle, fascia and tendon of long head of biceps, left arm, subsequent encounter: Secondary | ICD-10-CM | POA: Diagnosis not present

## 2018-07-26 DIAGNOSIS — M75102 Unspecified rotator cuff tear or rupture of left shoulder, not specified as traumatic: Secondary | ICD-10-CM | POA: Diagnosis not present

## 2018-07-26 DIAGNOSIS — M6281 Muscle weakness (generalized): Secondary | ICD-10-CM | POA: Diagnosis not present

## 2018-08-01 DIAGNOSIS — S46112D Strain of muscle, fascia and tendon of long head of biceps, left arm, subsequent encounter: Secondary | ICD-10-CM | POA: Diagnosis not present

## 2018-08-01 DIAGNOSIS — M75102 Unspecified rotator cuff tear or rupture of left shoulder, not specified as traumatic: Secondary | ICD-10-CM | POA: Diagnosis not present

## 2018-08-01 DIAGNOSIS — M7542 Impingement syndrome of left shoulder: Secondary | ICD-10-CM | POA: Diagnosis not present

## 2018-08-01 DIAGNOSIS — M25612 Stiffness of left shoulder, not elsewhere classified: Secondary | ICD-10-CM | POA: Diagnosis not present

## 2018-08-03 ENCOUNTER — Ambulatory Visit: Payer: BLUE CROSS/BLUE SHIELD

## 2018-08-03 DIAGNOSIS — M25612 Stiffness of left shoulder, not elsewhere classified: Secondary | ICD-10-CM | POA: Diagnosis not present

## 2018-08-03 DIAGNOSIS — S46112D Strain of muscle, fascia and tendon of long head of biceps, left arm, subsequent encounter: Secondary | ICD-10-CM | POA: Diagnosis not present

## 2018-08-03 DIAGNOSIS — M75102 Unspecified rotator cuff tear or rupture of left shoulder, not specified as traumatic: Secondary | ICD-10-CM | POA: Diagnosis not present

## 2018-08-03 DIAGNOSIS — M7542 Impingement syndrome of left shoulder: Secondary | ICD-10-CM | POA: Diagnosis not present

## 2018-08-08 DIAGNOSIS — M7542 Impingement syndrome of left shoulder: Secondary | ICD-10-CM | POA: Diagnosis not present

## 2018-08-08 DIAGNOSIS — M25612 Stiffness of left shoulder, not elsewhere classified: Secondary | ICD-10-CM | POA: Diagnosis not present

## 2018-08-08 DIAGNOSIS — M75102 Unspecified rotator cuff tear or rupture of left shoulder, not specified as traumatic: Secondary | ICD-10-CM | POA: Diagnosis not present

## 2018-08-08 DIAGNOSIS — S46112D Strain of muscle, fascia and tendon of long head of biceps, left arm, subsequent encounter: Secondary | ICD-10-CM | POA: Diagnosis not present

## 2018-08-14 DIAGNOSIS — S46112D Strain of muscle, fascia and tendon of long head of biceps, left arm, subsequent encounter: Secondary | ICD-10-CM | POA: Diagnosis not present

## 2018-08-14 DIAGNOSIS — M25612 Stiffness of left shoulder, not elsewhere classified: Secondary | ICD-10-CM | POA: Diagnosis not present

## 2018-08-14 DIAGNOSIS — M7542 Impingement syndrome of left shoulder: Secondary | ICD-10-CM | POA: Diagnosis not present

## 2018-08-14 DIAGNOSIS — M75102 Unspecified rotator cuff tear or rupture of left shoulder, not specified as traumatic: Secondary | ICD-10-CM | POA: Diagnosis not present

## 2018-08-16 DIAGNOSIS — S46112D Strain of muscle, fascia and tendon of long head of biceps, left arm, subsequent encounter: Secondary | ICD-10-CM | POA: Diagnosis not present

## 2018-08-16 DIAGNOSIS — M75102 Unspecified rotator cuff tear or rupture of left shoulder, not specified as traumatic: Secondary | ICD-10-CM | POA: Diagnosis not present

## 2018-08-16 DIAGNOSIS — M25612 Stiffness of left shoulder, not elsewhere classified: Secondary | ICD-10-CM | POA: Diagnosis not present

## 2018-08-16 DIAGNOSIS — M7542 Impingement syndrome of left shoulder: Secondary | ICD-10-CM | POA: Diagnosis not present

## 2018-08-23 DIAGNOSIS — M75102 Unspecified rotator cuff tear or rupture of left shoulder, not specified as traumatic: Secondary | ICD-10-CM | POA: Diagnosis not present

## 2018-08-23 DIAGNOSIS — M25612 Stiffness of left shoulder, not elsewhere classified: Secondary | ICD-10-CM | POA: Diagnosis not present

## 2018-08-23 DIAGNOSIS — M7542 Impingement syndrome of left shoulder: Secondary | ICD-10-CM | POA: Diagnosis not present

## 2018-08-23 DIAGNOSIS — M6281 Muscle weakness (generalized): Secondary | ICD-10-CM | POA: Diagnosis not present

## 2018-08-29 DIAGNOSIS — S46112D Strain of muscle, fascia and tendon of long head of biceps, left arm, subsequent encounter: Secondary | ICD-10-CM | POA: Diagnosis not present

## 2018-08-29 DIAGNOSIS — M75102 Unspecified rotator cuff tear or rupture of left shoulder, not specified as traumatic: Secondary | ICD-10-CM | POA: Diagnosis not present

## 2018-08-29 DIAGNOSIS — M25612 Stiffness of left shoulder, not elsewhere classified: Secondary | ICD-10-CM | POA: Diagnosis not present

## 2018-08-29 DIAGNOSIS — M7542 Impingement syndrome of left shoulder: Secondary | ICD-10-CM | POA: Diagnosis not present

## 2018-08-31 DIAGNOSIS — M75102 Unspecified rotator cuff tear or rupture of left shoulder, not specified as traumatic: Secondary | ICD-10-CM | POA: Diagnosis not present

## 2018-08-31 DIAGNOSIS — M6281 Muscle weakness (generalized): Secondary | ICD-10-CM | POA: Diagnosis not present

## 2018-08-31 DIAGNOSIS — M7542 Impingement syndrome of left shoulder: Secondary | ICD-10-CM | POA: Diagnosis not present

## 2018-08-31 DIAGNOSIS — S46112D Strain of muscle, fascia and tendon of long head of biceps, left arm, subsequent encounter: Secondary | ICD-10-CM | POA: Diagnosis not present

## 2018-08-31 DIAGNOSIS — M25612 Stiffness of left shoulder, not elsewhere classified: Secondary | ICD-10-CM | POA: Diagnosis not present

## 2018-09-01 ENCOUNTER — Ambulatory Visit
Admission: RE | Admit: 2018-09-01 | Discharge: 2018-09-01 | Disposition: A | Discharge status: Home / Self Care | Payer: BLUE CROSS/BLUE SHIELD | Source: Ambulatory Visit | Attending: Family Medicine | Admitting: Family Medicine

## 2018-09-01 DIAGNOSIS — Z1231 Encounter for screening mammogram for malignant neoplasm of breast: Secondary | ICD-10-CM

## 2018-09-04 DIAGNOSIS — M6281 Muscle weakness (generalized): Secondary | ICD-10-CM | POA: Diagnosis not present

## 2018-09-04 DIAGNOSIS — M75102 Unspecified rotator cuff tear or rupture of left shoulder, not specified as traumatic: Secondary | ICD-10-CM | POA: Diagnosis not present

## 2018-09-04 DIAGNOSIS — M25612 Stiffness of left shoulder, not elsewhere classified: Secondary | ICD-10-CM | POA: Diagnosis not present

## 2018-09-04 DIAGNOSIS — M7542 Impingement syndrome of left shoulder: Secondary | ICD-10-CM | POA: Diagnosis not present

## 2018-09-04 DIAGNOSIS — S46112D Strain of muscle, fascia and tendon of long head of biceps, left arm, subsequent encounter: Secondary | ICD-10-CM | POA: Diagnosis not present

## 2018-09-07 DIAGNOSIS — S46112D Strain of muscle, fascia and tendon of long head of biceps, left arm, subsequent encounter: Secondary | ICD-10-CM | POA: Diagnosis not present

## 2018-09-07 DIAGNOSIS — M7542 Impingement syndrome of left shoulder: Secondary | ICD-10-CM | POA: Diagnosis not present

## 2018-09-07 DIAGNOSIS — M75102 Unspecified rotator cuff tear or rupture of left shoulder, not specified as traumatic: Secondary | ICD-10-CM | POA: Diagnosis not present

## 2018-09-07 DIAGNOSIS — M25612 Stiffness of left shoulder, not elsewhere classified: Secondary | ICD-10-CM | POA: Diagnosis not present

## 2018-09-08 ENCOUNTER — Telehealth: Payer: Self-pay | Admitting: *Deleted

## 2018-09-08 MED ORDER — LINACLOTIDE 145 MCG PO CAPS: ORAL_CAPSULE | ORAL | 1 refills | Status: DC

## 2018-09-08 NOTE — Telephone Encounter (Signed)
Your information has been submitted to [employer] [place]. [employer] [place] will review the request and fax you a determination directly, typically within 3 business days of your submission once all necessary information is received.  If [employer] [place] has not responded in 3 business days or if you have any questions about your submission, contact [employer] [place] at [phone]. 

## 2018-09-08 NOTE — Telephone Encounter (Signed)
Received PA determination.   PA approved effective from 09/08/2018 through 09/07/2019.

## 2018-09-08 NOTE — Telephone Encounter (Signed)
Received request from pharmacy for PA on Linzess.  PA submitted.   Dx: K59- chronic constipation

## 2018-09-13 DIAGNOSIS — M6281 Muscle weakness (generalized): Secondary | ICD-10-CM | POA: Diagnosis not present

## 2018-09-13 DIAGNOSIS — M75102 Unspecified rotator cuff tear or rupture of left shoulder, not specified as traumatic: Secondary | ICD-10-CM | POA: Diagnosis not present

## 2018-09-13 DIAGNOSIS — M7542 Impingement syndrome of left shoulder: Secondary | ICD-10-CM | POA: Diagnosis not present

## 2018-09-13 DIAGNOSIS — S46112D Strain of muscle, fascia and tendon of long head of biceps, left arm, subsequent encounter: Secondary | ICD-10-CM | POA: Diagnosis not present

## 2018-09-19 DIAGNOSIS — S46112D Strain of muscle, fascia and tendon of long head of biceps, left arm, subsequent encounter: Secondary | ICD-10-CM | POA: Diagnosis not present

## 2018-09-19 DIAGNOSIS — M25612 Stiffness of left shoulder, not elsewhere classified: Secondary | ICD-10-CM | POA: Diagnosis not present

## 2018-09-19 DIAGNOSIS — M6281 Muscle weakness (generalized): Secondary | ICD-10-CM | POA: Diagnosis not present

## 2018-09-19 DIAGNOSIS — M7542 Impingement syndrome of left shoulder: Secondary | ICD-10-CM | POA: Diagnosis not present

## 2018-09-20 DIAGNOSIS — M6281 Muscle weakness (generalized): Secondary | ICD-10-CM | POA: Diagnosis not present

## 2018-11-01 DIAGNOSIS — H66001 Acute suppurative otitis media without spontaneous rupture of ear drum, right ear: Secondary | ICD-10-CM | POA: Diagnosis not present

## 2018-11-06 ENCOUNTER — Telehealth: Payer: Self-pay | Admitting: *Deleted

## 2018-11-06 NOTE — Telephone Encounter (Signed)
Your information has been submitted to [employer] [place]. [employer] [place] will review the request and fax you a determination directly, typically within 3 business days of your submission once all necessary information is received.  If [employer] [place] has not responded in 3 business days or if you have any questions about your submission, contact [employer] [place] at [phone]. 

## 2018-11-06 NOTE — Telephone Encounter (Signed)
Received request from pharmacy for PA on Saxenda.  PA submitted.   Dx: E66.09- obesity  07/19/2017: 197lb 10/24/2017: 185lb 6% weight reduction  02/22/2018: 182lb 7/61% weight reduction

## 2018-11-08 MED ORDER — LIRAGLUTIDE -WEIGHT MANAGEMENT 18 MG/3ML ~~LOC~~ SOPN: 0.6000 mg | PEN_INJECTOR | Freq: Every day | SUBCUTANEOUS | 3 refills | Status: DC

## 2018-11-08 NOTE — Telephone Encounter (Signed)
Received PA determination.   PA [id number] approved 11/06/2018- 11/05/2019.  Pharmacy made aware.

## 2018-12-11 ENCOUNTER — Telehealth: Payer: Self-pay | Admitting: *Deleted

## 2018-12-11 NOTE — Telephone Encounter (Signed)
Received call from patient.   Reports that Linzess is no longer covered by her insurance and is $300 per month.   Requested MD recommend new treatment for her chronic constipation.

## 2018-12-12 MED ORDER — LUBIPROSTONE 24 MCG PO CAPS: 24.0000 ug | ORAL_CAPSULE | Freq: Two times a day (BID) | ORAL | 3 refills | Status: DC

## 2018-12-12 NOTE — Telephone Encounter (Signed)
See if amitiza  60mcg BID covered

## 2018-12-12 NOTE — Telephone Encounter (Signed)
Prescription sent to pharmacy. Pharmacy contacted and was advised PA required.

## 2018-12-13 MED ORDER — LUBIPROSTONE 24 MCG PO CAPS: 24.0000 ug | ORAL_CAPSULE | Freq: Two times a day (BID) | ORAL | 3 refills | Status: DC

## 2018-12-13 NOTE — Telephone Encounter (Signed)
Call placed to pharmacy to inquire about coverage of Amitiza. Reports that medication was covered after PA, but co-pay is >$200. Savings card activated (ID: [id number]) and co-pay reduced to $16.10 for 90 day supply.   Call placed to patient and patient made aware. States that she wil lpick up medication.

## 2018-12-13 NOTE — Telephone Encounter (Signed)
PA submitted.   Received immediate response.   PA approved 12/13/2018 through 12/12/2019.  Pharmacy made aware.

## 2019-01-10 ENCOUNTER — Other Ambulatory Visit: Payer: Self-pay | Admitting: Family Medicine

## 2019-01-31 DIAGNOSIS — M25512 Pain in left shoulder: Secondary | ICD-10-CM | POA: Diagnosis not present

## 2019-01-31 DIAGNOSIS — M542 Cervicalgia: Secondary | ICD-10-CM | POA: Diagnosis not present

## 2019-02-21 DIAGNOSIS — M25512 Pain in left shoulder: Secondary | ICD-10-CM | POA: Diagnosis not present

## 2019-03-01 ENCOUNTER — Encounter: Payer: BLUE CROSS/BLUE SHIELD | Admitting: Family Medicine

## 2019-03-08 DIAGNOSIS — J309 Allergic rhinitis, unspecified: Secondary | ICD-10-CM | POA: Diagnosis not present

## 2019-03-08 DIAGNOSIS — H66005 Acute suppurative otitis media without spontaneous rupture of ear drum, recurrent, left ear: Secondary | ICD-10-CM | POA: Diagnosis not present

## 2019-03-20 ENCOUNTER — Ambulatory Visit (INDEPENDENT_AMBULATORY_CARE_PROVIDER_SITE_OTHER): Payer: BC Managed Care – PPO | Admitting: Family Medicine

## 2019-03-20 ENCOUNTER — Other Ambulatory Visit: Payer: Self-pay

## 2019-03-20 ENCOUNTER — Encounter: Payer: Self-pay | Admitting: Family Medicine

## 2019-03-20 VITALS — BP 130/68 | HR 72 | Temp 98.7°F | Resp 16 | Ht 63.5 in | Wt 205.0 lb

## 2019-03-20 DIAGNOSIS — M159 Polyosteoarthritis, unspecified: Secondary | ICD-10-CM

## 2019-03-20 DIAGNOSIS — Z0000 Encounter for general adult medical examination without abnormal findings: Secondary | ICD-10-CM

## 2019-03-20 DIAGNOSIS — N951 Menopausal and female climacteric states: Secondary | ICD-10-CM | POA: Diagnosis not present

## 2019-03-20 DIAGNOSIS — Z23 Encounter for immunization: Secondary | ICD-10-CM

## 2019-03-20 DIAGNOSIS — E7800 Pure hypercholesterolemia, unspecified: Secondary | ICD-10-CM

## 2019-03-20 DIAGNOSIS — K5909 Other constipation: Secondary | ICD-10-CM

## 2019-03-20 DIAGNOSIS — M2550 Pain in unspecified joint: Secondary | ICD-10-CM

## 2019-03-20 DIAGNOSIS — Z0001 Encounter for general adult medical examination with abnormal findings: Secondary | ICD-10-CM

## 2019-03-20 DIAGNOSIS — R7989 Other specified abnormal findings of blood chemistry: Secondary | ICD-10-CM | POA: Diagnosis not present

## 2019-03-20 DIAGNOSIS — I10 Essential (primary) hypertension: Secondary | ICD-10-CM

## 2019-03-20 DIAGNOSIS — R229 Localized swelling, mass and lump, unspecified: Secondary | ICD-10-CM

## 2019-03-20 DIAGNOSIS — Z6835 Body mass index (BMI) 35.0-35.9, adult: Secondary | ICD-10-CM

## 2019-03-20 DIAGNOSIS — G4730 Sleep apnea, unspecified: Secondary | ICD-10-CM

## 2019-03-20 DIAGNOSIS — F321 Major depressive disorder, single episode, moderate: Secondary | ICD-10-CM

## 2019-03-20 MED ORDER — ESTRADIOL 0.5 MG PO TABS: 0.5000 mg | ORAL_TABLET | Freq: Every day | ORAL | 1 refills | Status: DC

## 2019-03-20 MED ORDER — SHINGRIX 50 MCG/0.5ML IM SUSR: 0.5000 mL | Freq: Once | INTRAMUSCULAR | 1 refills | Status: AC

## 2019-03-20 NOTE — Assessment & Plan Note (Signed)
unFortunately her insurance will not cover Linzess which has worked for her.  We did have a good amount of samples which I gave her today.

## 2019-03-20 NOTE — Patient Instructions (Addendum)
Start hormone therapy Try Linzess 290mg  every other day Sleep study to be set up  FLu shot  Shingles to pharmacy  F/U 1 month

## 2019-03-20 NOTE — Assessment & Plan Note (Signed)
Blood pressure is controlled no change in medication. 

## 2019-03-20 NOTE — Assessment & Plan Note (Signed)
With her depression her hot flashes which most of this is around the plan she is willing to try hormone therapy.  We will start her on estradiol 0.5 mg once a day she is status post hysterectomy for noncancerous reasons.  We will follow her up in a month and see if this is helped with her mood before make any changes to her Paxil or the Cymbalta 60 mg which she also uses for chronic pain.

## 2019-03-20 NOTE — Progress Notes (Signed)
Subjective:    Patient ID: Rhonda [name], female    DOB: 29-Dec-1960, 58 y.o.   MRN: [id number]  Patient presents for Annual Exam (is not fasting)  Patient here for complete physical exam medications reviewed. History reviewed Immunizations due for flu shot, Tdap is up-to-date ,discussed shingle vaccine  Mammogram up-to-date March 2020 colonoscopy up-to-date in 2015  Patient here with multiple complaints.  Most of the centers around her chronic hot flashes vaginal dryness mood swings.  She has been dealing with this for many years.  She is currently on Paxil.  She is also on black cohosh.  We discussed hormone therapy in the past she has declined this.  She would like to actually try it now.  States that she just wakes up drenched in sweat every day has hot flashes throughout the day is always sweaty and feels like she cannot control her hormones.  This makes her quite upset as well.  She is also having troubles with her chronic constipation She has joint pain all over.  She also has some nodules that she states have been intermittently popping up on her legs and then resolving on their own.  They are not painful.   Feels that she is always always feels depressed and anxious,  Works in her yard for her down time This year has been significantly stressful for her in the setting of the pandemic she also llost 3 cousins, and 1 aunt and she has been trying to take care of her mother and her child who is special needs.   She has sleep apnea symptoms, her daughter and mother has noticed apnea symptoms   Chronic constipation- smoothie with miralax and flaxseed, amitiza doesn't work, IT consultant worked well  She ha   She did try Keto but she noticed that she would just regain all the weight back when she went off of the diet.   abnormal TSH in 2019 due for repeat  Review Of Systems:  GEN- denies fatigue, fever, weight loss,weakness, recent illness HEENT- denies eye drainage, change in vision, nasal  discharge, CVS- denies chest pain, palpitations RESP- denies SOB, cough, wheeze ABD- denies N/V, change in stools, abd pain GU- denies dysuria, hematuria, dribbling, incontinence MSK- denies joint pain, muscle aches, injury Neuro- denies headache, dizziness, syncope, seizure activity       Objective:    BP 130/68   Pulse 72   Temp 98.7 F (37.1 C) (Oral)   Resp 16   Ht 5' 3.5" (1.613 m)   Wt 205 lb (93 kg)   SpO2 98%   BMI 35.74 kg/m  GEN- NAD, alert and oriented x3 HEENT- PERRL, EOMI, non injected sclera, pink conjunctiva, MMM, oropharynx clear Neck- Supple, no thyromegaly CVS- RRR, no murmur RESP-CTAB ABD-NABS,soft,NT,ND Psych- stressed appearing, not anxious, good eye contact, no SI  EXT- No edema Hyperpigmented nodules, NT on post legs  Pulses- Radial, DP- 2+        Assessment & Plan:      Problem List Items Addressed This Visit      Unprioritized   Chronic constipation    unFortunately her insurance will not cover Linzess which has worked for her.  We did have a good amount of samples which I gave her today.      Depression, major, single episode, moderate (HCC)   Relevant Medications   DULoxetine (CYMBALTA) 30 MG capsule   Essential hypertension, benign    Blood pressure is controlled no change in medication.  Relevant Orders   CBC with Differential/Platelet   Comprehensive metabolic panel   Generalized OA    She has generalized joint pain is had this for years.  However now she has these nodules on the back of her legs.  Initially thought erythema nodosum but they are not tender or painful she has not had any recent illness.  I will check however CRP ANA level may need dermatology involved if nothing is coming up.      Hyperlipidemia   Relevant Orders   Lipid panel   Menopausal hot flushes    With her depression her hot flashes which most of this is around the plan she is willing to try hormone therapy.  We will start her on estradiol 0.5  mg once a day she is status post hysterectomy for noncancerous reasons.  We will follow her up in a month and see if this is helped with her mood before make any changes to her Paxil or the Cymbalta 60 mg which she also uses for chronic pain.      Obesity    Other Visit Diagnoses    Routine general medical examination at a health care facility    -  Primary   CPE done, fasting labs, flu shot given, shingrix to pharmacy   Abnormal TSH       Relevant Orders   TSH   T3, free   T4, free   Sleep apnea, unspecified type       Relevant Orders   Ambulatory referral to Sleep Studies   Multiple skin nodules       Relevant Orders   ANA   C-reactive protein   Sedimentation Rate   Arthralgia, unspecified joint       Relevant Orders   ANA   C-reactive protein   Sedimentation Rate   Rheumatoid factor   Need for immunization against influenza       Relevant Orders   Flu Vaccine QUAD 36+ mos IM (Completed)      Note: This dictation was prepared with Dragon dictation along with smaller phrase technology. Any transcriptional errors that result from this process are unintentional.

## 2019-03-20 NOTE — Assessment & Plan Note (Signed)
She has generalized joint pain is had this for years.  However now she has these nodules on the back of her legs.  Initially thought erythema nodosum but they are not tender or painful she has not had any recent illness.  I will check however CRP ANA level may need dermatology involved if nothing is coming up.

## 2019-03-21 LAB — CBC WITH DIFFERENTIAL/PLATELET
Absolute Monocytes: 615 cells/uL (ref 200–950)
Basophils Absolute: 41 cells/uL (ref 0–200)
Basophils Relative: 0.7 %
Eosinophils Absolute: 110 cells/uL (ref 15–500)
Eosinophils Relative: 1.9 %
HCT: 39.2 % (ref 35.0–45.0)
Hemoglobin: 13.4 g/dL (ref 11.7–15.5)
Lymphs Abs: 2187 cells/uL (ref 850–3900)
MCH: 30.9 pg (ref 27.0–33.0)
MCHC: 34.2 g/dL (ref 32.0–36.0)
MCV: 90.3 fL (ref 80.0–100.0)
MPV: 10.1 fL (ref 7.5–12.5)
Monocytes Relative: 10.6 %
Neutro Abs: 2848 cells/uL (ref 1500–7800)
Neutrophils Relative %: 49.1 %
Platelets: 256 10*3/uL (ref 140–400)
RBC: 4.34 10*6/uL (ref 3.80–5.10)
RDW: 13.2 % (ref 11.0–15.0)
Total Lymphocyte: 37.7 %
WBC: 5.8 10*3/uL (ref 3.8–10.8)

## 2019-03-21 LAB — ANA: Anti Nuclear Antibody (ANA): NEGATIVE

## 2019-03-21 LAB — LIPID PANEL
Cholesterol: 253 mg/dL — ABNORMAL HIGH (ref <200)
HDL: 71 mg/dL (ref >=50)
LDL Cholesterol (Calc): 167 mg/dL (calc) — ABNORMAL HIGH
Non-HDL Cholesterol (Calc): 182 mg/dL (calc) — ABNORMAL HIGH (ref <130)
Total CHOL/HDL Ratio: 3.6 (calc) (ref <5.0)
Triglycerides: 59 mg/dL (ref <150)

## 2019-03-21 LAB — COMPREHENSIVE METABOLIC PANEL
AG Ratio: 1.5 (calc) (ref 1.0–2.5)
ALT: 9 U/L (ref 6–29)
AST: 14 U/L (ref 10–35)
Albumin: 4 g/dL (ref 3.6–5.1)
Alkaline phosphatase (APISO): 62 U/L (ref 37–153)
BUN: 12 mg/dL (ref 7–25)
CO2: 31 mmol/L (ref 20–32)
Calcium: 9.2 mg/dL (ref 8.6–10.4)
Chloride: 101 mmol/L (ref 98–110)
Creat: 0.71 mg/dL (ref 0.50–1.05)
Globulin: 2.7 g/dL (calc) (ref 1.9–3.7)
Glucose, Bld: 75 mg/dL (ref 65–99)
Potassium: 3.9 mmol/L (ref 3.5–5.3)
Sodium: 138 mmol/L (ref 135–146)
Total Bilirubin: 0.4 mg/dL (ref 0.2–1.2)
Total Protein: 6.7 g/dL (ref 6.1–8.1)

## 2019-03-21 LAB — SEDIMENTATION RATE: Sed Rate: 14 mm/h (ref 0–30)

## 2019-03-21 LAB — C-REACTIVE PROTEIN: CRP: 2.1 mg/L (ref <8.0)

## 2019-03-21 LAB — RHEUMATOID FACTOR: Rheumatoid fact SerPl-aCnc: <14 IU/mL (ref <14)

## 2019-03-21 LAB — TSH: TSH: 0.63 mIU/L (ref 0.40–4.50)

## 2019-03-21 LAB — T3, FREE: T3, Free: 3 pg/mL (ref 2.3–4.2)

## 2019-03-21 LAB — T4, FREE: Free T4: 1.1 ng/dL (ref 0.8–1.8)

## 2019-03-22 ENCOUNTER — Telehealth: Payer: Self-pay | Admitting: *Deleted

## 2019-03-22 MED ORDER — BENZONATATE 100 MG PO CAPS: 100.0000 mg | ORAL_CAPSULE | Freq: Two times a day (BID) | ORAL | 1 refills | Status: DC | PRN

## 2019-03-22 NOTE — Telephone Encounter (Signed)
Received call from patient.   Reports that she has (2) concerns. Firstly, injection site of flu shot is having localized reaction. States that area is red, warm to touch and hard. Advised to use OTC IBU and ice pack. Advised if area worsens, starts to blister, or does not improve >3 days to contact office to eval.   Also reports that she has nonproductive cough and nasal drainage. Advised to use OTC Mucinex or Robitussin for cough and nasal saline for drainage. Advised to increase rest and to increase fluid intake. Recommended that if symptoms worsen or persist >1 week to contact office for visit.   MD to be made aware.

## 2019-03-23 ENCOUNTER — Other Ambulatory Visit: Payer: Self-pay | Admitting: *Deleted

## 2019-03-23 DIAGNOSIS — R229 Localized swelling, mass and lump, unspecified: Secondary | ICD-10-CM

## 2019-03-23 MED ORDER — EZETIMIBE 10 MG PO TABS: 10.0000 mg | ORAL_TABLET | Freq: Every day | ORAL | 3 refills | Status: DC

## 2019-03-23 NOTE — Telephone Encounter (Signed)
noted 

## 2019-03-30 DIAGNOSIS — L920 Granuloma annulare: Secondary | ICD-10-CM | POA: Diagnosis not present

## 2019-03-30 DIAGNOSIS — L853 Xerosis cutis: Secondary | ICD-10-CM | POA: Diagnosis not present

## 2019-03-30 DIAGNOSIS — R21 Rash and other nonspecific skin eruption: Secondary | ICD-10-CM | POA: Diagnosis not present

## 2019-04-02 ENCOUNTER — Telehealth: Payer: Self-pay

## 2019-04-02 NOTE — Telephone Encounter (Signed)
I called pt to r/s her 10/7 appt until 9/8 at 9:00am. Pt verbalized understanding of new appt date and time.

## 2019-04-04 ENCOUNTER — Institutional Professional Consult (permissible substitution): Payer: BC Managed Care – PPO | Admitting: Neurology

## 2019-04-05 ENCOUNTER — Encounter: Payer: Self-pay | Admitting: Neurology

## 2019-04-05 ENCOUNTER — Other Ambulatory Visit: Payer: Self-pay

## 2019-04-05 ENCOUNTER — Ambulatory Visit: Payer: BC Managed Care – PPO | Admitting: Neurology

## 2019-04-05 VITALS — BP 151/95 | HR 88 | Ht 64.0 in | Wt 206.0 lb

## 2019-04-05 DIAGNOSIS — R0681 Apnea, not elsewhere classified: Secondary | ICD-10-CM

## 2019-04-05 DIAGNOSIS — G4719 Other hypersomnia: Secondary | ICD-10-CM

## 2019-04-05 DIAGNOSIS — E669 Obesity, unspecified: Secondary | ICD-10-CM

## 2019-04-05 DIAGNOSIS — R519 Headache, unspecified: Secondary | ICD-10-CM | POA: Diagnosis not present

## 2019-04-05 DIAGNOSIS — G478 Other sleep disorders: Secondary | ICD-10-CM

## 2019-04-05 DIAGNOSIS — R0683 Snoring: Secondary | ICD-10-CM | POA: Diagnosis not present

## 2019-04-05 NOTE — Progress Notes (Signed)
Subjective:    Patient ID: Rhonda [name] is a 58 y.o. female.  HPI    Star Age, MD, PhD [facility] [address] [place], [place] [address]  Dear Dr. Buelah Manis, I saw your patient, Rhonda [name], upon your kind request to my sleep clinic today for initial consultation of her sleep disorder, in particular, concern for underlying obstructive sleep apnea.  The patient is unaccompanied today.  As you know, his Billups is a 58 year old right-handed woman with an underlying medical history of arthritis, history of carpal tunnel syndrome with status post surgery on the left, depression, hypertension, and obesity, who reports snoring and excessive daytime somnolence, nonrestorative sleep, morning headaches and witnessed apneas.  The patient reports that she has been told by her daughter that she stops breathing and that snoring has been loud as reported by her daughter and also by a cousin.  She does not wake up rested.  She has lack of energy.  She has an Epworth sleepiness score of 8 out of 24, fatigue severity score is 49 out of 63.  She tries to nap on the weekend when she can catch up on sleep and may sleep for 3 to 4 hours during what started as a nap.  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  She has an uncle with sleep apnea.  She has had trouble losing weight despite trying.  She lives with her 77 year old mother who stays with her during the week, particularly to help out with patient's 51 year old oldest son who has special needs.  She has a set of twins, age 27, a son and a daughter, she has 37 51-year-old grandson.  She has no night to night nocturia.  She has woken up with the occasional headache, different from her migraines in the past.  Her morning headaches are more dull and achy and pressure-like.  She drinks caffeine in the form of coffee, typically 2 cups/day.  She has had some  food allergies but has not been formally tested.  Sometimes when she eats tomatoes for example her tongue swells and she takes Benadryl.  She is reminded to get allergy testing done for this.  Her bedtime is generally between 930 and 1030.  She falls asleep quickly but does not stay asleep well.  She has multiple nighttime awakenings.  Her rise time is around 530.  She has to be at work around 830.  Her Past Medical History Is Significant For: Past Medical History:  Diagnosis Date  . Arthritis    knees  . Carpal tunnel syndrome of left wrist 07/2016  . Depression   . History of MRSA infection > 10 years   right leg  . Hypertension    states under control with meds., has been on med. > 2 yr.    Her Past Surgical History Is Significant For: Past Surgical History:  Procedure Laterality Date  . ABDOMINAL HYSTERECTOMY     partial  . BREAST EXCISIONAL BIOPSY    . CARPAL TUNNEL RELEASE Left 08/26/2016   Procedure: LEFT WRIST CARPAL TUNNEL RELEASE;  Surgeon: Ninetta Lights, MD;  Location: [facility];  Service: Orthopedics;  Laterality: Left;  . COLONOSCOPY WITH PROPOFOL  03/20/2014  . LAPAROSCOPIC UNILATERAL SALPINGECTOMY Right 05/18/2002  . OVARY BIOPSY Right 05/18/2002  . TRIGGER FINGER RELEASE Left 02/10/2017   Procedure: LEFT TRIGGER THUMB RELEASE (TENDON SHEATH INCISION);  Surgeon: Loreta Ave, MD;  Location: [facility];  Service: Orthopedics;  Laterality: Left;    Her Family History Is Significant For: Family History  Problem Relation Age of Onset  . Breast cancer Maternal Aunt     Her Social History Is Significant For: Social History   Socioeconomic History  . Marital status: Single    Spouse name: Not on file  . Number of children: Not on file  . Years of education: Not on file  . Highest education level: Not on file  Occupational History  . Not on file  Social Needs  . Financial resource strain: Not on file  . Food insecurity     Worry: Not on file    Inability: Not on file  . Transportation needs    Medical: Not on file    Non-medical: Not on file  Tobacco Use  . Smoking status: Never Smoker  . Smokeless tobacco: Never Used  Substance and Sexual Activity  . Alcohol use: No  . Drug use: No  . Sexual activity: Not on file  Lifestyle  . Physical activity    Days per week: Not on file    Minutes per session: Not on file  . Stress: Not on file  Relationships  . Social Musician on phone: Not on file    Gets together: Not on file    Attends religious service: Not on file    Active member of club or organization: Not on file    Attends meetings of clubs or organizations: Not on file    Relationship status: Not on file  Other Topics Concern  . Not on file  Social History Narrative  . Not on file    Her Allergies Are:  Allergies  Allergen Reactions  . Penicillins Hives        . Statins Other (See Comments)    MYALGIAS  . Lisinopril Cough  :   Her Current Medications Are:  Outpatient Encounter Medications as of 04/05/2019  Medication Sig  . amLODipine (NORVASC) 5 MG tablet TAKE 1 TABLET BY MOUTH EVERY DAY  . atenolol (TENORMIN) 50 MG tablet TAKE 1 TABLET BY MOUTH EVERY DAY  . benzonatate (TESSALON) 100 MG capsule Take 1 capsule (100 mg total) by mouth 2 (two) times daily as needed for cough.  . Black Cohosh 160 MG CAPS Take by mouth.  . cholecalciferol (VITAMIN D) 1000 units tablet Take 1,000 Units by mouth daily.  . clotrimazole-betamethasone (LOTRISONE) cream Apply 1 application topically 2 (two) times daily.  . DULoxetine (CYMBALTA) 30 MG capsule Take 60 mg by mouth daily.  Marland Kitchen estradiol (ESTRACE) 0.5 MG tablet Take 1 tablet (0.5 mg total) by mouth daily.  . fluticasone (FLONASE) 50 MCG/ACT nasal spray Place into both nostrils daily.  . hydrochlorothiazide (HYDRODIURIL) 25 MG tablet TAKE 1 TABLET BY MOUTH EVERY DAY  . PARoxetine (PAXIL) 20 MG tablet Take 1 tablet (20 mg total) by  mouth daily.  . valACYclovir (VALTREX) 500 MG tablet Take 500 mg by mouth 2 (two) times daily.  . [DISCONTINUED] ezetimibe (ZETIA) 10 MG tablet Take 1 tablet (10 mg total) by mouth daily.   No facility-administered encounter medications on file as of 04/05/2019.   :  Review of Systems:  Out of a complete 14 point review of systems, all are reviewed and negative with the exception of these symptoms as listed below:  Review of Systems  Neurological:       Pt  presents today to discuss her sleep. Pt has never had a sleep study but does endorse snoring.  Epworth Sleepiness Scale 0= would never doze 1= slight chance of dozing 2= moderate chance of dozing 3= high chance of dozing  Sitting and reading: 1 Watching TV: 1 Sitting inactive in a public place (ex. Theater or meeting): 0 As a passenger in a car for an hour without a break: 2 Lying down to rest in the afternoon: 3 Sitting and talking to someone: 1 Sitting quietly after lunch (no alcohol): 0 In a car, while stopped in traffic: 0 Total: 8     Objective:  Neurological Exam  Physical Exam Physical Examination:   Vitals:   04/05/19 0903  BP: (!) 151/95  Pulse: 88   General Examination: The patient is a very pleasant 58 y.o. female in no acute distress. She appears well-developed and well-nourished and well groomed.   HEENT: Normocephalic, atraumatic, pupils are equal, round and reactive to light. Extraocular tracking is good without limitation to gaze excursion or nystagmus noted. Normal smooth pursuit is noted. Hearing is grossly intact. Face is symmetric with normal facial animation and normal facial sensation. Speech is clear with no dysarthria noted. There is no hypophonia. There is no lip, neck/head, jaw or voice tremor. Neck is supple with full range of passive and active motion. There are no carotid bruits on auscultation. Oropharynx exam reveals: Mild mouth dryness, slightly irregular surface of the tongue but no  tongue swelling, no uvula swelling.  Uvula is normal in size, airway is mildly crowded secondary to tonsillar size of 1+ on the left and 1-2+ on the right.  Tongue protrudes centrally in palate elevates symmetrically.  Neck circumference is 15 and three-quarter inches.  She has a minimal overbite. Nasal inspection reveals benign findings: no significant nasal mucosal bogginess or redness and no septal deviation.   Chest: Clear to auscultation without wheezing, rhonchi or crackles noted.  Heart: S1+S2+0, regular and normal without murmurs, rubs or gallops noted.   Abdomen: Soft, non-tender and non-distended with normal bowel sounds appreciated on auscultation.  Extremities: There is no pitting edema in the distal lower extremities bilaterally.  Skin: Warm and dry without trophic changes noted.  Musculoskeletal: exam reveals no obvious joint deformities, tenderness or joint swelling or erythema.   Neurologically:  Mental status: The patient is awake, alert and oriented in all 4 spheres. Her immediate and remote memory, attention, language skills and fund of knowledge are appropriate. There is no evidence of aphasia, agnosia, apraxia or anomia. Speech is clear with normal prosody and enunciation. Thought process is linear. Mood is normal and affect is normal.  Cranial nerves II - XII are as described above under HEENT exam. In addition: shoulder shrug is normal with equal shoulder height noted. Motor exam: Normal bulk, strength and tone is noted. There is no drift, tremor or rebound. Romberg is negative. Fine motor skills and coordination: grossly intact.  Cerebellar testing: No dysmetria or intention tremor. There is no truncal or gait ataxia.  Sensory exam: intact to light touch.   Gait, station and balance: She stands easily. No veering to one side is noted. No leaning to one side is noted. Posture is age-appropriate and stance is narrow based. Gait shows normal stride length and normal pace. No  problems turning are noted. Tandem walk is Slightly challenging for her.  Assessment and Plan:   In summary, Rhonda [name] is a very pleasant 58 y.o.-year old female with an underlying  medical history of arthritis, history of carpal tunnel syndrome with status post surgery on the left, depression, hypertension, and obesity, whose history and physical exam are concerning for obstructive sleep apnea (OSA). I had a long chat with the patient about my findings and the diagnosis of OSA, its prognosis and treatment options. We talked about medical treatments, surgical interventions and non-pharmacological approaches. I explained in particular the risks and ramifications of untreated moderate to severe OSA, especially with respect to developing cardiovascular disease down the Road, including congestive heart failure, difficult to treat hypertension, cardiac arrhythmias, or stroke. Even type 2 diabetes has, in part, been linked to untreated OSA. Symptoms of untreated OSA include daytime sleepiness, memory problems, mood irritability and mood disorder such as depression and anxiety, lack of energy, as well as recurrent headaches, especially morning headaches. We talked about trying to maintain a healthy lifestyle in general, as well as the importance of weight control. We also talked about the importance of good sleep hygiene. I recommended the following at this time: sleep study.   I explained the sleep test procedure to the patient and also outlined possible surgical and non-surgical treatment options of OSA, including the use of a custom-made dental device (which would require a referral to a specialist dentist or oral surgeon), upper airway surgical options, such as traditional UPPP or a novel less invasive surgical option in the form of Inspire hypoglossal nerve stimulation (which would involve a referral to an ENT surgeon). I also explained the CPAP treatment option to the patient, who indicated that she would  be willing to try CPAP if the need arises. I explained the importance of being compliant with PAP treatment, not only for insurance purposes but primarily to improve Her symptoms, and for the patient's long term health benefit, including to reduce Her cardiovascular risks. I answered all her questions today and the patient was in agreement. I plan to see her back after the sleep study is completed and encouraged her to call with any interim questions, concerns, problems or updates.   Thank you very much for allowing me to participate in the care of this nice patient. If I can be of any further assistance to you please do not hesitate to call me at [phone].  Sincerely,   Huston Foley, MD, PhD

## 2019-04-05 NOTE — Patient Instructions (Signed)

## 2019-04-11 ENCOUNTER — Other Ambulatory Visit: Payer: Self-pay | Admitting: Family Medicine

## 2019-04-14 ENCOUNTER — Other Ambulatory Visit: Payer: Self-pay | Admitting: Family Medicine

## 2019-04-18 ENCOUNTER — Ambulatory Visit (INDEPENDENT_AMBULATORY_CARE_PROVIDER_SITE_OTHER): Payer: BC Managed Care – PPO | Admitting: Neurology

## 2019-04-18 DIAGNOSIS — G4733 Obstructive sleep apnea (adult) (pediatric): Secondary | ICD-10-CM | POA: Diagnosis not present

## 2019-04-18 DIAGNOSIS — G4719 Other hypersomnia: Secondary | ICD-10-CM

## 2019-04-18 DIAGNOSIS — E669 Obesity, unspecified: Secondary | ICD-10-CM

## 2019-04-18 DIAGNOSIS — G478 Other sleep disorders: Secondary | ICD-10-CM

## 2019-04-18 DIAGNOSIS — R0681 Apnea, not elsewhere classified: Secondary | ICD-10-CM

## 2019-04-18 DIAGNOSIS — R519 Headache, unspecified: Secondary | ICD-10-CM

## 2019-04-18 DIAGNOSIS — R0683 Snoring: Secondary | ICD-10-CM

## 2019-04-20 NOTE — Progress Notes (Signed)
Patient referred by Dr. Buelah Manis, seen by me on 04/05/19, HST on 04/18/19.    Please call and notify the patient that the recent home sleep test showed obstructive sleep apnea in the severe range. While I recommend treatment for this in the form CPAP, her insurance will not approve a sleep study for this. They will likely only approve a trial of autoPAP, which means, that we don't have to bring her in for a sleep study with CPAP, but will let her try an autoPAP machine at home, through a DME company (of her choice, or as per insurance requirement). The DME representative will educate her on how to use the machine, how to put the mask on, etc. I have placed an order in the chart. Please send referral, talk to patient, send report to referring MD. We will need a FU in sleep clinic for 10 weeks post-PAP set up, please arrange that with me or one of our NPs. Thanks,   Rhonda Age, MD, PhD [facility])

## 2019-04-20 NOTE — Addendum Note (Signed)
Addended by: Star Age on: 04/20/2019 02:12 PM   Modules accepted: Orders

## 2019-04-20 NOTE — Procedures (Signed)
Patient Information     First Name: Rhonda Last Name: [name] ID: [id number]  Birth Date: Feb 12, 1961 Age: 58 Gender: Female  Referring Provider: Dr. Jeanice Lim BMI: 35.4 (W=207 lb, H=5' 4'')  Neck Circ.:  15 '' Epworth:  8/24   Sleep Study Information    Study Date: Apr 18, 2019 S/H/A Version: [id number] / 4.1.1528 / 45  History:    58 year old woman with a history of arthritis, history of carpal tunnel syndrome with status post-surgery on the left, depression, hypertension, and obesity, who reports snoring and excessive daytime somnolence, nonrestorative sleep, morning headaches and witnessed apneas.    Summary & Diagnosis:    OSA Recommendations:     This home sleep test demonstrates severe obstructive sleep apnea with a total AHI of 53.2/hour and O2 nadir of 88%. Treatment with positive airway pressure (in the form of CPAP) is recommended. This will require a full night CPAP titration study for proper treatment settings, O2 monitoring and mask fitting. Based on the severity of the sleep disordered breathing an attended titration study is indicated. However, patient's insurance has denied an attended sleep study; therefore, the patient will be advised to proceed with an autoPAP titration/trial at home for now. Please note that untreated obstructive sleep apnea may carry additional perioperative morbidity. Patients with significant obstructive sleep apnea should receive perioperative PAP therapy and the surgeons and particularly the anesthesiologist should be informed of the diagnosis and the severity of the sleep disordered breathing. The patient should be cautioned not to drive, work at heights, or operate dangerous or heavy equipment when tired or sleepy. Review and reiteration of good sleep hygiene measures should be pursued with any patient. Other causes of the patient's symptoms, including circadian rhythm disturbances, an underlying mood disorder, medication effect and/or an underlying medical  problem cannot be ruled out based on this test. Clinical correlation is recommended. The patient and her referring provider will be notified of the test results. The patient will be seen in follow up in sleep clinic at [facility].  I certify that I have reviewed the raw data recording prior to the issuance of this report in accordance with the standards of the American Academy of Sleep Medicine (AASM).  Huston Foley, MD, PhD [facility]) Diplomat, ABPN (Neurology and Sleep)            Sleep Summary  Oxygen Saturation Statistics   Start Study Time: End Study Time: Total Recording Time:        12:02:09 AM       8:29:38 AM 8 h, 27 min  Total Sleep Time % REM of Sleep Time:  6 h, 10 min  18.9    Mean: 95 Minimum: 88 Maximum: 100  Mean of Desaturations Nadirs (%):   93  Oxygen Desaturation. %:   4-9 10-20 >20 Total  Events Number Total   136  1 99.3 0.7  0 0.0  137 100.0  Oxygen Saturation: <90 <=88 <85 <80 <70  Duration (minutes): Sleep % 0.5 0.1  0.1 0.0  0.0 0.0 0.0 0.0 0.0 0.0     Respiratory Indices      Total Events REM NREM All Night  pRDI:  219  pAHI:  211 ODI:  137  pAHIc:  17  % CSR: 0.0 54.3 54.3 37.8 0.0 55.4 53.1 34.2 5.9 55.3 53.2 34.6 5.4       Pulse Rate Statistics during Sleep (BPM)      Mean: 68 Minimum: 35  Maximum: 96    Indices are calculated using technically valid sleep time of  5 hrs, 57 min. Central-Indices are calculated using technically valid sleep time of  3  hrs, 8 min. pRDI/pAHI are calculated using oxi desaturations ? 3%   Body Position Statistics  Position Supine Prone Right Left Non-Supine  Sleep (min) 117.5 19.0 96.0 62.5 177.5  Sleep % 31.7 5.1 25.9 16.9 47.9  pRDI 48.3 N/A 56.1 50.3 57.4  pAHI 46.1 N/A 52.2 47.9 54.4  ODI 29.1 N/A 33.9 26.4 29.9     Snoring Statistics Snoring Level (dB) >40 >50 >60 >70 >80 >Threshold (45)  Sleep (min) 217.9 67.4 3.9 0.0 0.0 124.0  Sleep % 58.8 18.2 1.0  0.0 0.0 33.5    Mean: 44 dB Sleep Stages Chart                                                                             pAHI=53.2                                                                                              Mild              Moderate                    Severe                                                 5              15                    30  * Reference values are according to AASM guidelines

## 2019-04-24 ENCOUNTER — Telehealth: Payer: Self-pay

## 2019-04-24 NOTE — Telephone Encounter (Signed)
I called pt to discuss her sleep study results. No answer, left a message asking her to call me back. 

## 2019-04-24 NOTE — Telephone Encounter (Signed)
-----   Message from Star Age, MD sent at 04/20/2019  2:12 PM EDT ----- Patient referred by Dr. Buelah Manis, seen by me on 04/05/19, HST on 04/18/19.    Please call and notify the patient that the recent home sleep test showed obstructive sleep apnea in the severe range. While I recommend treatment for this in the form CPAP, her insurance will not approve a sleep study for this. They will likely only approve a trial of autoPAP, which means, that we don't have to bring her in for a sleep study with CPAP, but will let her try an autoPAP machine at home, through a DME company (of her choice, or as per insurance requirement). The DME representative will educate her on how to use the machine, how to put the mask on, etc. I have placed an order in the chart. Please send referral, talk to patient, send report to referring MD. We will need a FU in sleep clinic for 10 weeks post-PAP set up, please arrange that with me or one of our NPs. Thanks,   Star Age, MD, PhD [facility])

## 2019-04-25 NOTE — Telephone Encounter (Signed)
Pt returned call. Please call back when available. 

## 2019-04-25 NOTE — Telephone Encounter (Signed)
I called pt again to discuss. No answer, left a message asking her to call me back. 

## 2019-04-25 NOTE — Telephone Encounter (Signed)
The pt left a voicemail returning your call. Pt is asking for a call back.

## 2019-04-26 NOTE — Telephone Encounter (Signed)
I called pt. I advised pt that Dr. Rexene Alberts reviewed their sleep study results and found that pt has severe osa. Dr. Rexene Alberts recommends that pt start an auto pap at home since pt's insurance will not approve an in-lab sleep study. I reviewed PAP compliance expectations with the pt. Pt is agreeable to starting an auto-PAP. I advised pt that an order will be sent to a DME, [facility], and [facility] will call the pt within about one week after they file with the pt's insurance. [facility] will show the pt how to use the machine, fit for masks, and troubleshoot the auto-PAP if needed. A follow up appt was made for insurance purposes with Amy, NP on 07/16/19 at 8:00am. Pt verbalized understanding to arrive 15 minutes early and bring their auto-PAP. A letter with all of this information in it will be mailed to the pt as a reminder. I verified with the pt that the address we have on file is correct. Pt verbalized understanding of results. Pt had no questions at this time but was encouraged to call back if questions arise. I have sent the order to [facility] and have received confirmation that they have received the order.

## 2019-04-27 ENCOUNTER — Other Ambulatory Visit: Payer: Self-pay | Admitting: Family Medicine

## 2019-04-27 DIAGNOSIS — I10 Essential (primary) hypertension: Secondary | ICD-10-CM

## 2019-05-04 DIAGNOSIS — L720 Epidermal cyst: Secondary | ICD-10-CM | POA: Diagnosis not present

## 2019-05-04 DIAGNOSIS — L920 Granuloma annulare: Secondary | ICD-10-CM | POA: Diagnosis not present

## 2019-05-07 DIAGNOSIS — G4733 Obstructive sleep apnea (adult) (pediatric): Secondary | ICD-10-CM | POA: Diagnosis not present

## 2019-05-17 ENCOUNTER — Other Ambulatory Visit: Payer: Self-pay | Admitting: Family Medicine

## 2019-06-03 DIAGNOSIS — Z20828 Contact with and (suspected) exposure to other viral communicable diseases: Secondary | ICD-10-CM | POA: Diagnosis not present

## 2019-06-06 DIAGNOSIS — G4733 Obstructive sleep apnea (adult) (pediatric): Secondary | ICD-10-CM | POA: Diagnosis not present

## 2019-06-29 ENCOUNTER — Other Ambulatory Visit: Payer: Self-pay | Admitting: Family Medicine

## 2019-07-07 DIAGNOSIS — G4733 Obstructive sleep apnea (adult) (pediatric): Secondary | ICD-10-CM | POA: Diagnosis not present

## 2019-07-15 NOTE — Patient Instructions (Addendum)
Continue CPAP nightly and greater than 4 hours each night   We will send new pressure orders to DME. Monitor for a good mask seal on CPAP machine or app.   Follow up in 3 months, sooner if needed   Sleep Apnea Sleep apnea affects breathing during sleep. It causes breathing to stop for a short time or to become shallow. It can also increase the risk of:  Heart attack.  Stroke.  Being very overweight (obese).  Diabetes.  Heart failure.  Irregular heartbeat. The goal of treatment is to help you breathe normally again. What are the causes? There are three kinds of sleep apnea:  Obstructive sleep apnea. This is caused by a blocked or collapsed airway.  Central sleep apnea. This happens when the brain does not send the right signals to the muscles that control breathing.  Mixed sleep apnea. This is a combination of obstructive and central sleep apnea. The most common cause of this condition is a collapsed or blocked airway. This can happen if:  Your throat muscles are too relaxed.  Your tongue and tonsils are too large.  You are overweight.  Your airway is too small. What increases the risk?  Being overweight.  Smoking.  Having a small airway.  Being older.  Being female.  Drinking alcohol.  Taking medicines to calm yourself (sedatives or tranquilizers).  Having family members with the condition. What are the signs or symptoms?  Trouble staying asleep.  Being sleepy or tired during the day.  Getting angry a lot.  Loud snoring.  Headaches in the morning.  Not being able to focus your mind (concentrate).  Forgetting things.  Less interest in sex.  Mood swings.  Personality changes.  Feelings of sadness (depression).  Waking up a lot during the night to pee (urinate).  Dry mouth.  Sore throat. How is this diagnosed?  Your medical history.  A physical exam.  A test that is done when you are sleeping (sleep study). The test is most often  done in a sleep lab but may also be done at home. How is this treated?   Sleeping on your side.  Using a medicine to get rid of mucus in your nose (decongestant).  Avoiding the use of alcohol, medicines to help you relax, or certain pain medicines (narcotics).  Losing weight, if needed.  Changing your diet.  Not smoking.  Using a machine to open your airway while you sleep, such as: ? An oral appliance. This is a mouthpiece that shifts your lower jaw forward. ? A CPAP device. This device blows air through a mask when you breathe out (exhale). ? An EPAP device. This has valves that you put in each nostril. ? A BPAP device. This device blows air through a mask when you breathe in (inhale) and breathe out.  Having surgery if other treatments do not work. It is important to get treatment for sleep apnea. Without treatment, it can lead to:  High blood pressure.  Coronary artery disease.  In men, not being able to have an erection (impotence).  Reduced thinking ability. Follow these instructions at home: Lifestyle  Make changes that your doctor recommends.  Eat a healthy diet.  Lose weight if needed.  Avoid alcohol, medicines to help you relax, and some pain medicines.  Do not use any products that contain nicotine or tobacco, such as cigarettes, e-cigarettes, and chewing tobacco. If you need help quitting, ask your doctor. General instructions  Take over-the-counter and prescription medicines  only as told by your doctor.  If you were given a machine to use while you sleep, use it only as told by your doctor.  If you are having surgery, make sure to tell your doctor you have sleep apnea. You may need to bring your device with you.  Keep all follow-up visits as told by your doctor. This is important. Contact a doctor if:  The machine that you were given to use during sleep bothers you or does not seem to be working.  You do not get better.  You get worse. Get help  right away if:  Your chest hurts.  You have trouble breathing in enough air.  You have an uncomfortable feeling in your back, arms, or stomach.  You have trouble talking.  One side of your body feels weak.  A part of your face is hanging down. These symptoms may be an emergency. Do not wait to see if the symptoms will go away. Get medical help right away. Call your local emergency services (911 in the [place] Do not drive yourself to the hospital. Summary  This condition affects breathing during sleep.  The most common cause is a collapsed or blocked airway.  The goal of treatment is to help you breathe normally while you sleep. This information is not intended to replace advice given to you by your health care provider. Make sure you discuss any questions you have with your health care provider. Document Revised: 03/31/2018 Document Reviewed: 02/07/2018 Elsevier Patient Education  [place].

## 2019-07-15 NOTE — Progress Notes (Addendum)
PATIENT: Rhonda [name] DOB: [birth year]  REASON FOR VISIT: follow up HISTORY FROM: patient  Chief Complaint  Patient presents with  . Follow-up    Initial cpap f/u. Alone. Rm 1. No new concerns at this time.      HISTORY OF PRESENT ILLNESS: Today 07/16/19 Rhonda [name] is a 59 y.o. female here today for follow up. HST revealed severe OSA with AHI 52.3 and O2 nadir of 88%. She returns today for her initial CPAP compliance review.  She has noted significant improvement in sleep quality since starting CPAP therapy.  She is using CPAP nightly.  Her only concern is that she does note a leak around the nasal area of her mask.  She is using a DreamWear full facemask.  She is concerned that it may be the wrong size.  Otherwise she is doing very well  Compliance report dated 06/12/2019 through 07/11/2019 reveals that she used CPAP 30 out of the last 30 days for compliance of 100%.  She is CPAP greater than 4 hours all 30 days for compliance of 100%.  Average usage was 6 hours and 17 minutes.  Residual AHI was 6.2 on 6 to 12 cm of water and an EPR of 3.  Pressure in the 95th percentile of 11.8 with maximum 11.9.  A leak was noted in the 95th percentile of 22.6.  HISTORY: (copied from Dr Teofilo Pod note on 04/05/2019)  Dear Dr. Jeanice Lim, I saw your patient, Rhonda [name], upon your kind request to my sleep clinic today for initial consultation of her sleep disorder, in particular, concern for underlying obstructive sleep apnea.  The patient is unaccompanied today.  As you know, his Rhonda [name] is a 59 year old right-handed woman with an underlying medical history of arthritis, history of carpal tunnel syndrome with status post surgery on the left, depression, hypertension, and obesity, who reports snoring and excessive daytime somnolence, nonrestorative sleep, morning headaches and witnessed apneas.  The patient reports that she has been told by her daughter that she stops breathing and that snoring has been loud  as reported by her daughter and also by a cousin.  She does not wake up rested.  She has lack of energy.  She has an Epworth sleepiness score of 8 out of 24, fatigue severity score is 49 out of 63.  She tries to nap on the weekend when she can catch up on sleep and may sleep for 3 to 4 hours during what started as a nap.  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  She has an uncle with sleep apnea.  She has had trouble losing weight despite trying.  She lives with her 57 year old mother who stays with her during the week, particularly to help out with patient's 66 year old oldest son who has special needs.  She has a set of twins, age 40, a son and a daughter, she has 53 90-year-old grandson.  She has no night to night nocturia.  She has woken up with the occasional headache, different from her migraines in the past.  Her morning headaches are more dull and achy and pressure-like.  She drinks caffeine in the form of coffee, typically 2 cups/day.  She has had some food allergies but has not been formally tested.  Sometimes when she eats tomatoes for example her tongue swells and she takes Benadryl.  She is reminded to get allergy testing done for this.  Her bedtime  is generally between 930 and 1030.  She falls asleep quickly but does not stay asleep well.  She has multiple nighttime awakenings.  Her rise time is around 530.  She has to be at work around 830.   REVIEW OF SYSTEMS: Out of a complete 14 system review of symptoms, the patient complains only of the following symptoms, dryness of nose, constipation, joint pain, neck stiffness and all other reviewed systems are negative.  Epworth sleepiness scale: 2 Fatigue severity scale: 24  ALLERGIES: Allergies  Allergen Reactions  . Penicillins Hives        . Statins Other (See Comments)    MYALGIAS  . Lisinopril Cough    HOME MEDICATIONS: Outpatient Medications Prior to Visit    Medication Sig Dispense Refill  . amLODipine (NORVASC) 5 MG tablet TAKE 1 TABLET BY MOUTH EVERY DAY 90 tablet 1  . atenolol (TENORMIN) 50 MG tablet TAKE 1 TABLET BY MOUTH EVERY DAY 90 tablet 1  . benzonatate (TESSALON) 100 MG capsule Take 1 capsule (100 mg total) by mouth 2 (two) times daily as needed for cough. 20 capsule 1  . cholecalciferol (VITAMIN D) 1000 units tablet Take 1,000 Units by mouth daily.    . clotrimazole-betamethasone (LOTRISONE) cream Apply 1 application topically 2 (two) times daily. 30 g 0  . DULoxetine (CYMBALTA) 30 MG capsule Take 60 mg by mouth daily.    Marland Kitchen estradiol (ESTRACE) 0.5 MG tablet TAKE 1 TABLET BY MOUTH EVERY DAY 90 tablet 1  . ezetimibe (ZETIA) 10 MG tablet Take 10 mg by mouth daily.    . fluticasone (FLONASE) 50 MCG/ACT nasal spray Place into both nostrils as needed.     . hydrochlorothiazide (HYDRODIURIL) 25 MG tablet TAKE 1 TABLET BY MOUTH EVERY DAY 90 tablet 2  . PARoxetine (PAXIL) 20 MG tablet TAKE 1 TABLET BY MOUTH EVERY DAY 90 tablet 2  . valACYclovir (VALTREX) 500 MG tablet Take 500 mg by mouth as needed.     . Black Cohosh 160 MG CAPS Take by mouth.     No facility-administered medications prior to visit.    PAST MEDICAL HISTORY: Past Medical History:  Diagnosis Date  . Arthritis    knees  . Carpal tunnel syndrome of left wrist 07/2016  . Depression   . History of MRSA infection > 10 years   right leg  . Hypertension    states under control with meds., has been on med. > 2 yr.    PAST SURGICAL HISTORY: Past Surgical History:  Procedure Laterality Date  . ABDOMINAL HYSTERECTOMY     partial  . BREAST EXCISIONAL BIOPSY    . CARPAL TUNNEL RELEASE Left 08/26/2016   Procedure: LEFT WRIST CARPAL TUNNEL RELEASE;  Surgeon: Loreta Ave, MD;  Location: [facility];  Service: Orthopedics;  Laterality: Left;  . COLONOSCOPY WITH PROPOFOL  03/20/2014  . LAPAROSCOPIC UNILATERAL SALPINGECTOMY Right 05/18/2002  . OVARY BIOPSY Right  05/18/2002  . TRIGGER FINGER RELEASE Left 02/10/2017   Procedure: LEFT TRIGGER THUMB RELEASE (TENDON SHEATH INCISION);  Surgeon: Loreta Ave, MD;  Location: [facility];  Service: Orthopedics;  Laterality: Left;    FAMILY HISTORY: Family History  Problem Relation Age of Onset  . Breast cancer Maternal Aunt     SOCIAL HISTORY: Social History   Socioeconomic History  . Marital status: Single    Spouse name: Not on file  . Number of children: Not on file  . Years of education: Not on file  .  Highest education level: Not on file  Occupational History  . Not on file  Tobacco Use  . Smoking status: Never Smoker  . Smokeless tobacco: Never Used  Substance and Sexual Activity  . Alcohol use: No  . Drug use: No  . Sexual activity: Not on file  Other Topics Concern  . Not on file  Social History Narrative  . Not on file   Social Determinants of Health   Financial Resource Strain:   . Difficulty of Paying Living Expenses: Not on file  Food Insecurity:   . Worried About Programme researcher, broadcasting/film/videounning Out of Food in the Last Year: Not on file  . Ran Out of Food in the Last Year: Not on file  Transportation Needs:   . Lack of Transportation (Medical): Not on file  . Lack of Transportation (Non-Medical): Not on file  Physical Activity:   . Days of Exercise per Week: Not on file  . Minutes of Exercise per Session: Not on file  Stress:   . Feeling of Stress : Not on file  Social Connections:   . Frequency of Communication with Friends and Family: Not on file  . Frequency of Social Gatherings with Friends and Family: Not on file  . Attends Religious Services: Not on file  . Active Member of Clubs or Organizations: Not on file  . Attends BankerClub or Organization Meetings: Not on file  . Marital Status: Not on file  Intimate Partner Violence:   . Fear of Current or Ex-Partner: Not on file  . Emotionally Abused: Not on file  . Physically Abused: Not on file  . Sexually Abused: Not on  file      PHYSICAL EXAM  Vitals:   07/16/19 0827  BP: 128/77  Pulse: 73  Temp: (!) 97 F (36.1 C)  TempSrc: Oral  Weight: 213 lb 3.2 oz (96.7 kg)  Height: 5\' 4"  (1.626 m)   Body mass index is 36.6 kg/m.  Generalized: Well developed, in no acute distress  Cardiology: normal rate and rhythm, no murmur noted Respiratory: Clear to auscultation bilaterally Neurological examination  Mentation: Alert oriented to time, place, history taking. Follows all commands speech and language fluent Cranial nerve II-XII: Pupils were equal round reactive to light. Extraocular movements were full, visual field were full  Motor: The motor testing reveals 5 over 5 strength of all 4 extremities. Good symmetric motor tone is noted throughout.  Gait and station: Gait is normal.   DIAGNOSTIC DATA (LABS, IMAGING, TESTING) - I reviewed patient records, labs, notes, testing and imaging myself where available.  No flowsheet data found.   Lab Results  Component Value Date   WBC 5.8 03/20/2019   HGB 13.4 03/20/2019   HCT 39.2 03/20/2019   MCV 90.3 03/20/2019   PLT 256 03/20/2019      Component Value Date/Time   NA 138 03/20/2019 1546   K 3.9 03/20/2019 1546   CL 101 03/20/2019 1546   CO2 31 03/20/2019 1546   GLUCOSE 75 03/20/2019 1546   BUN 12 03/20/2019 1546   CREATININE 0.71 03/20/2019 1546   CALCIUM 9.2 03/20/2019 1546   PROT 6.7 03/20/2019 1546   ALBUMIN 4.2 08/02/2016 1109   AST 14 03/20/2019 1546   ALT 9 03/20/2019 1546   ALKPHOS 59 08/02/2016 1109   BILITOT 0.4 03/20/2019 1546   GFRNONAA 88 02/02/2018 0939   GFRAA 102 02/02/2018 0939   Lab Results  Component Value Date   CHOL 253 (H) 03/20/2019   HDL 71  03/20/2019   LDLCALC 167 (H) 03/20/2019   TRIG 59 03/20/2019   CHOLHDL 3.6 03/20/2019   Lab Results  Component Value Date   HGBA1C 5.4 02/14/2015   Lab Results  Component Value Date   VITAMINB12 264 04/25/2007   Lab Results  Component Value Date   TSH 0.63  03/20/2019       ASSESSMENT AND PLAN 59 y.o. year old female  has a past medical history of Arthritis, Carpal tunnel syndrome of left wrist (07/2016), Depression, History of MRSA infection (> 10 years), and Hypertension. here with     ICD-10-CM   1. OSA on CPAP  G47.33 For home use only DME continuous positive airway pressure (CPAP)   Z99.89 For home use only DME continuous positive airway pressure (CPAP)    Ms. Herschberger is doing very well on CPAP therapy.  She does note significant improvements in sleep quality.  She is concerned of a leak in her mask.  We will send orders today for mask refitting.  I will also adjust her pressure settings to a minimum pressure of 7 cm of water and maximum of 13 cm of water.  EPR 3.  I am hopeful that this will aid in reducing residual AHI.  She was encouraged to continue using CPAP nightly and for greater than 4 hours each night.  She will follow-up with me in 3 months, sooner if needed.  She verbalizes understanding and agreement with this plan.   Orders Placed This Encounter  Procedures  . For home use only DME continuous positive airway pressure (CPAP)    Adjust pressures please to the following: minimum pressure of 7cmH20 and Max pressure of 13cmH20. EPR 3.    Order Specific Question:   Length of Need    Answer:   Lifetime    Order Specific Question:   Patient has OSA or probable OSA    Answer:   Yes    Order Specific Question:   Is the patient currently using CPAP in the home    Answer:   Yes    Order Specific Question:   Settings    Answer:   Other see comments    Order Specific Question:   CPAP supplies needed    Answer:   Mask, headgear, cushions, filters, heated tubing and water chamber  . For home use only DME continuous positive airway pressure (CPAP)    Mask refitting please    Order Specific Question:   Length of Need    Answer:   Lifetime    Order Specific Question:   Patient has OSA or probable OSA    Answer:   Yes    Order Specific  Question:   Is the patient currently using CPAP in the home    Answer:   Yes    Order Specific Question:   Settings    Answer:   Other see comments    Order Specific Question:   CPAP supplies needed    Answer:   Mask, headgear, cushions, filters, heated tubing and water chamber     No orders of the defined types were placed in this encounter.     I spent 15 minutes with the patient. 50% of this time was spent counseling and educating patient on plan of care and medications.    Debbora Presto, FNP-C 07/16/2019, 8:50 AM [facility] [address], [place], [place] [address] [phone]  I reviewed the above note and documentation by the Nurse Practitioner  and agree with the history, exam, assessment and plan as outlined above. I was available for consultation. Huston Foley, MD, PhD [facility])

## 2019-07-16 ENCOUNTER — Encounter: Payer: Self-pay | Admitting: Family Medicine

## 2019-07-16 ENCOUNTER — Other Ambulatory Visit: Payer: Self-pay

## 2019-07-16 ENCOUNTER — Ambulatory Visit: Payer: BC Managed Care – PPO | Admitting: Family Medicine

## 2019-07-16 VITALS — BP 128/77 | HR 73 | Temp 97.0°F | Ht 64.0 in | Wt 213.2 lb

## 2019-07-16 DIAGNOSIS — G4733 Obstructive sleep apnea (adult) (pediatric): Secondary | ICD-10-CM | POA: Diagnosis not present

## 2019-07-16 DIAGNOSIS — Z9989 Dependence on other enabling machines and devices: Secondary | ICD-10-CM | POA: Diagnosis not present

## 2019-07-25 ENCOUNTER — Other Ambulatory Visit: Payer: Self-pay | Admitting: Family Medicine

## 2019-07-25 DIAGNOSIS — Z1231 Encounter for screening mammogram for malignant neoplasm of breast: Secondary | ICD-10-CM

## 2019-08-06 ENCOUNTER — Encounter: Payer: Self-pay | Admitting: Family Medicine

## 2019-08-06 ENCOUNTER — Other Ambulatory Visit: Payer: Self-pay

## 2019-08-06 ENCOUNTER — Ambulatory Visit (INDEPENDENT_AMBULATORY_CARE_PROVIDER_SITE_OTHER): Payer: BC Managed Care – PPO | Admitting: Family Medicine

## 2019-08-06 VITALS — BP 126/88 | HR 82 | Temp 98.6°F | Resp 16 | Ht 64.0 in | Wt 211.0 lb

## 2019-08-06 DIAGNOSIS — I10 Essential (primary) hypertension: Secondary | ICD-10-CM

## 2019-08-06 DIAGNOSIS — F321 Major depressive disorder, single episode, moderate: Secondary | ICD-10-CM

## 2019-08-06 DIAGNOSIS — Z6836 Body mass index (BMI) 36.0-36.9, adult: Secondary | ICD-10-CM | POA: Diagnosis not present

## 2019-08-06 DIAGNOSIS — E7800 Pure hypercholesterolemia, unspecified: Secondary | ICD-10-CM | POA: Diagnosis not present

## 2019-08-06 DIAGNOSIS — N951 Menopausal and female climacteric states: Secondary | ICD-10-CM

## 2019-08-06 MED ORDER — BUPROPION HCL ER (XL) 150 MG PO TB24: 150.0000 mg | ORAL_TABLET | Freq: Every day | ORAL | 2 refills | Status: DC

## 2019-08-06 NOTE — Assessment & Plan Note (Addendum)
She has some increased fullness in both breasts.  I recommend she get a mammogram first before we tweak that estrogen hormone.  Has not noted any knots or masses.

## 2019-08-06 NOTE — Patient Instructions (Addendum)
Decrease cymbalta to 1 capsule for 1 week, then stop  Start wellbutrin 150mg  XL one a day in the morning  Continue the paxil the same dose Schedule your mammogram  F/U 4 weeks

## 2019-08-06 NOTE — Assessment & Plan Note (Signed)
Blood pressure is controlled no change in medication. 

## 2019-08-06 NOTE — Progress Notes (Signed)
Subjective:    Patient ID: Rhonda [name], female    DOB: Nov 13, 1960, 59 y.o.   MRN: [id number]  Patient presents for Follow-up (is fasting) and Discuss Medications (mood swings)  Here to follow-up medications. Currently on Paxil and Cymbalta secondary to chronic pain as well as major depression.  At her last visit she was having a lot of problems with sleep as well as mood swings and hot flashes so I added estradiol.   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  Then she goes home and is caring for her special needs son and taking care of her elderly mother feels like she has no time for herself.  To find herself continues to have mood swings she does not have any energy during the day.  Her sleep is improved that she has been using her CPAP machine.  She want to know if there is anything that could be tweak with her medications.  She has been on Paxil for years but this also her postmenopausal symptoms.  Cymbalta was added secondary to pain but she never really felt significant difference with regards to the pain in her back.   Obesity- She went back on Saxenda ut she had at lot of GI upset and Gas so she stopped  sHe is frustrated that she continues to gain weight despite not changing much with her diet.  She is interested in intermittent fasting.  She states she has lost some pounds the past few weeks.  She has noticed her breast size has increased since she has been on estradiol.  She is scheduled for mammogram in March but she wants to know if her dose could be increased since she continues to have dull has some hot flashes.    Review Of Systems:  GEN- denies fatigue, fever, weight loss,weakness, recent illness HEENT- denies eye drainage, change in vision, nasal discharge, CVS- denies chest pain, palpitations RESP- denies SOB, cough, wheeze ABD- denies N/V, change in stools, abd pain GU- denies dysuria, hematuria,  dribbling, incontinence MSK- denies joint pain, muscle aches, injury Neuro- denies headache, dizziness, syncope, seizure activity       Objective:    BP 126/88   Pulse 82   Temp 98.6 F (37 C) (Temporal)   Resp 16   Ht 5\' 4"  (1.626 m)   Wt 211 lb (95.7 kg)   SpO2 99%   BMI 36.22 kg/m  GEN- NAD, alert and oriented x3 HEENT- PERRL, EOMI, non injected sclera, pink conjunctiva, MMM, oropharynx clear Neck- Supple, no thyromegaly CVS- RRR, no murmur RESP-CTAB ABD-NABS,soft,NT,ND Psych normal affect and mood pleasant normal speech good eye contact normal thought process EXT- No edema Pulses- Radial, DP- 2+  GAD 7 score 21 PHQ 9 score 16 No suicidal ideations     Assessment & Plan:      Problem List Items Addressed This Visit      Unprioritized   Depression, major, single episode, moderate (HCC) - Primary    Has had a longstanding history of depression.  Many factors contributing to her mood.  Taking care of her family and in her words there is only"one of her" , as well as her job.  I think the stressors combined with other factors make it more difficult for her to lose weight as well.  We discussed possibly coming off of tach.  When contributes to more weight gain but she was very  of this as it does help her postmenopausal symptoms and she has been on this for years.  Instead we will taper off of the Cymbalta and add Wellbutrin to the Paxil to see if this helps with energy in the morning time and concentration. Decrease his Cymbalta to 30 mg daily for 1 week then discontinue.  Go ahead and start Wellbutrin 150 mg extended release once a day, no change to Paxil dose We will follow-up in 4 weeks for medication check.      Relevant Medications   buPROPion (WELLBUTRIN XL) 150 MG 24 hr tablet   Essential hypertension, benign    Blood pressure is controlled no change in medication.      Relevant Orders   Comprehensive metabolic panel   Hyperlipidemia   Relevant Orders    Lipid panel   Menopausal hot flushes    She has some increased fullness in both breasts.  I recommend she get a mammogram first before we tweak that estrogen hormone.  Has not noted any knots or masses.      Obesity   Relevant Orders   Lipid panel   Hemoglobin A1c      Note: This dictation was prepared with Dragon dictation along with smaller phrase technology. Any transcriptional errors that result from this process are unintentional.

## 2019-08-06 NOTE — Assessment & Plan Note (Addendum)
Has had a longstanding history of depression.  Many factors contributing to her mood.  Taking care of her family and in her words there is only"one of her" , as well as her job.  I think the stressors combined with other factors make it more difficult for her to lose weight as well.  We discussed possibly coming off of tach.  When contributes to more weight gain but she was very leery of this as it does help her postmenopausal symptoms and she has been on this for years.  Instead we will taper off of the Cymbalta and add Wellbutrin to the Paxil to see if this helps with energy in the morning time and concentration. Decrease his Cymbalta to 30 mg daily for 1 week then discontinue.  Go ahead and start Wellbutrin 150 mg extended release once a day, no change to Paxil dose We will follow-up in 4 weeks for medication check.

## 2019-08-07 ENCOUNTER — Telehealth: Payer: Self-pay | Admitting: *Deleted

## 2019-08-07 DIAGNOSIS — G4733 Obstructive sleep apnea (adult) (pediatric): Secondary | ICD-10-CM | POA: Diagnosis not present

## 2019-08-07 LAB — COMPREHENSIVE METABOLIC PANEL
AG Ratio: 1.7 (calc) (ref 1.0–2.5)
ALT: 16 U/L (ref 6–29)
AST: 19 U/L (ref 10–35)
Albumin: 4.1 g/dL (ref 3.6–5.1)
Alkaline phosphatase (APISO): 56 U/L (ref 37–153)
BUN: 13 mg/dL (ref 7–25)
CO2: 28 mmol/L (ref 20–32)
Calcium: 9.1 mg/dL (ref 8.6–10.4)
Chloride: 104 mmol/L (ref 98–110)
Creat: 0.76 mg/dL (ref 0.50–1.05)
Globulin: 2.4 g/dL (calc) (ref 1.9–3.7)
Glucose, Bld: 100 mg/dL — ABNORMAL HIGH (ref 65–99)
Potassium: 4 mmol/L (ref 3.5–5.3)
Sodium: 139 mmol/L (ref 135–146)
Total Bilirubin: 0.3 mg/dL (ref 0.2–1.2)
Total Protein: 6.5 g/dL (ref 6.1–8.1)

## 2019-08-07 LAB — LIPID PANEL
Cholesterol: 208 mg/dL — ABNORMAL HIGH (ref <200)
HDL: 59 mg/dL (ref >=50)
LDL Cholesterol (Calc): 133 mg/dL (calc) — ABNORMAL HIGH
Non-HDL Cholesterol (Calc): 149 mg/dL (calc) — ABNORMAL HIGH (ref <130)
Total CHOL/HDL Ratio: 3.5 (calc) (ref <5.0)
Triglycerides: 69 mg/dL (ref <150)

## 2019-08-07 LAB — HEMOGLOBIN A1C
Hgb A1c MFr Bld: 5.6 % of total Hgb (ref <5.7)
Mean Plasma Glucose: 114 (calc)
eAG (mmol/L): 6.3 (calc)

## 2019-08-07 NOTE — Telephone Encounter (Signed)
Decrease the cymbalta to 1 capsule for 1 week, then STOP Okay to take 1/2 tablet of paxil but stay on this dose for now Start the wellbutrin now

## 2019-08-07 NOTE — Telephone Encounter (Signed)
Received call from patient.   Reports that she did decreased Cymbalta as discussed with PCP to (1) tab, but she would also like to come off of Paxil. States that she has begun to taper down and is only taking 1/2 tab at this time.   Also inquired as to when she is supposed to start Wellbutrin.   MD please advise.

## 2019-08-07 NOTE — Telephone Encounter (Signed)
Call placed to patient and patient made aware. Verbalized understanding.  

## 2019-08-12 ENCOUNTER — Other Ambulatory Visit: Payer: Self-pay

## 2019-08-12 ENCOUNTER — Ambulatory Visit (HOSPITAL_COMMUNITY)
Admission: EM | Admit: 2019-08-12 | Discharge: 2019-08-12 | Disposition: A | Discharge status: Home / Self Care | Payer: BC Managed Care – PPO | Attending: Family Medicine | Admitting: Family Medicine

## 2019-08-12 ENCOUNTER — Encounter (HOSPITAL_COMMUNITY): Payer: Self-pay | Admitting: Emergency Medicine

## 2019-08-12 DIAGNOSIS — R0602 Shortness of breath: Secondary | ICD-10-CM

## 2019-08-12 DIAGNOSIS — M545 Low back pain, unspecified: Secondary | ICD-10-CM

## 2019-08-12 MED ORDER — METHYLPREDNISOLONE 4 MG PO TBPK: ORAL_TABLET | ORAL | 0 refills | Status: DC

## 2019-08-12 MED ORDER — TIZANIDINE HCL 4 MG PO TABS: 4.0000 mg | ORAL_TABLET | Freq: Four times a day (QID) | ORAL | 0 refills | Status: DC | PRN

## 2019-08-12 NOTE — ED Triage Notes (Signed)
Lower back pain for 1 week. Has tried several OTC meds.   Sleeps with a CPAP machine. She did not have power last night. She woke up this morning with exertional shortness of breath.   Also notes that she was started on Wellbutrin last week.

## 2019-08-12 NOTE — Discharge Instructions (Addendum)
Take the medrol as directed This is a steroid to reduce inflammation Take all of day one today Take the muscle relaxer as needed Warmth to low back Avoid bending and lifting activities

## 2019-08-12 NOTE — ED Provider Notes (Signed)
[facility]    CSN: [id number] Arrival date & time: 08/12/19  1154      History   Chief Complaint Chief Complaint  Patient presents with  . Back Pain  . Shortness of Breath    HPI Rhonda SALEM is a 59 y.o. female.   HPI   Patient is here for low back pain.  She states is been present for about a week.  She does not know how she injured her back although she does have a 57 year old special needs child, and she has to do bending and some lifting to care for him and change his diapers.  She has never had a back condition before or significant back injury.  Pain is in the central low back.  No radiation.  It is not responding to Aleve or Tylenol or ibuprofen taken over-the-counter. She also lost electricity last night.  Had to sleep without her CPAP.  Feels slightly more short of breath today.  No fever chills, no body aches.  She has had 2 Covid shots.   Past Medical History:  Diagnosis Date  . Arthritis    knees  . Carpal tunnel syndrome of left wrist 07/2016  . Depression   . History of MRSA infection > 10 years   right leg  . Hypertension    states under control with meds., has been on med. > 2 yr.    Patient Active Problem List   Diagnosis Date Noted  . Depression, major, single episode, moderate (HCC) 10/24/2017  . Migraines 02/14/2015  . Chronic constipation 02/14/2015  . Hyperlipidemia 08/02/2014  . Generalized OA 12/19/2013  . Obesity 12/19/2013  . Shoulder pain 12/14/2012  . Essential hypertension, benign 12/14/2012  . Vaginal atrophy 12/14/2012  . Menopausal hot flushes 12/14/2012    Past Surgical History:  Procedure Laterality Date  . ABDOMINAL HYSTERECTOMY     partial  . BREAST EXCISIONAL BIOPSY    . CARPAL TUNNEL RELEASE Left 08/26/2016   Procedure: LEFT WRIST CARPAL TUNNEL RELEASE;  Surgeon: Loreta Ave, MD;  Location: [facility];  Service: Orthopedics;  Laterality: Left;  . COLONOSCOPY WITH PROPOFOL  03/20/2014  .  LAPAROSCOPIC UNILATERAL SALPINGECTOMY Right 05/18/2002  . OVARY BIOPSY Right 05/18/2002  . TRIGGER FINGER RELEASE Left 02/10/2017   Procedure: LEFT TRIGGER THUMB RELEASE (TENDON SHEATH INCISION);  Surgeon: Loreta Ave, MD;  Location: [facility];  Service: Orthopedics;  Laterality: Left;    OB History   No obstetric history on file.      Home Medications    Prior to Admission medications   Medication Sig Start Date End Date Taking? Authorizing Provider  amLODipine (NORVASC) 5 MG tablet TAKE 1 TABLET BY MOUTH EVERY DAY 04/27/19  Yes [place], Velna Hatchet, MD  atenolol (TENORMIN) 50 MG tablet TAKE 1 TABLET BY MOUTH EVERY DAY 04/16/19  Yes [place], Velna Hatchet, MD  buPROPion (WELLBUTRIN XL) 150 MG 24 hr tablet Take 1 tablet (150 mg total) by mouth daily. 08/06/19  Yes [place], Velna Hatchet, MD  cholecalciferol (VITAMIN D) 1000 units tablet Take 1,000 Units by mouth daily.   Yes [provider]  clotrimazole-betamethasone (LOTRISONE) cream Apply 1 application topically 2 (two) times daily. 02/09/17  Yes [place], Velna Hatchet, MD  DULoxetine (CYMBALTA) 30 MG capsule Take 60 mg by mouth daily.   Yes [provider]  estradiol (ESTRACE) 0.5 MG tablet TAKE 1 TABLET BY MOUTH EVERY DAY 04/11/19  Yes [place], Velna Hatchet, MD  ezetimibe (ZETIA) 10 MG tablet Take 10 mg by mouth daily. 06/19/19  Yes [provider]  fluticasone (FLONASE) 50 MCG/ACT nasal spray Place into both nostrils as needed.    Yes [provider]  hydrochlorothiazide (HYDRODIURIL) 25 MG tablet TAKE 1 TABLET BY MOUTH EVERY DAY 07/02/19  Yes [place], Velna Hatchet, MD  PARoxetine (PAXIL) 20 MG tablet TAKE 1 TABLET BY MOUTH EVERY DAY Patient taking differently: 5 mg.  05/17/19  Yes Salley Scarlet, MD  methylPREDNISolone (MEDROL DOSEPAK) 4 MG TBPK tablet tad 08/12/19   Eustace Moore, MD  tiZANidine (ZANAFLEX) 4 MG tablet Take 1-2 tablets (4-8 mg total) by mouth every 6 (six) hours as needed for muscle  spasms. 08/12/19   Eustace Moore, MD  valACYclovir (VALTREX) 500 MG tablet Take 500 mg by mouth as needed.     [provider]    Family History Family History  Problem Relation Age of Onset  . Breast cancer Maternal Aunt     Social History Social History   Tobacco Use  . Smoking status: Never Smoker  . Smokeless tobacco: Never Used  Substance Use Topics  . Alcohol use: No  . Drug use: No     Allergies   Penicillins, Statins, and Lisinopril   Review of Systems Review of Systems  Constitutional: Negative for chills and fever.  Respiratory: Positive for shortness of breath. Negative for cough.   Musculoskeletal: Positive for back pain. Negative for myalgias, neck pain and neck stiffness.     Physical Exam Triage Vital Signs ED Triage Vitals  Enc Vitals Group     BP 08/12/19 1213 130/67     Pulse Rate 08/12/19 1213 69     Resp 08/12/19 1212 18     Temp 08/12/19 1212 98.5 F (36.9 C)     Temp Source 08/12/19 1212 Oral     SpO2 08/12/19 1213 100 %     Weight --      Height --      Head Circumference --      Peak Flow --      Pain Score 08/12/19 1209 6     Pain Loc --      Pain Edu? --      Excl. in GC? --    No data found.  Updated Vital Signs BP 130/67   Pulse 69   Temp 98.5 F (36.9 C) (Oral)   Resp 18   SpO2 100%     Physical Exam Constitutional:      General: She is not in acute distress.    Appearance: She is well-developed. She is obese.  HENT:     Head: Normocephalic and atraumatic.     Mouth/Throat:     Comments: Mask in place Eyes:     Conjunctiva/sclera: Conjunctivae normal.     Pupils: Pupils are equal, round, and reactive to light.  Cardiovascular:     Rate and Rhythm: Normal rate and regular rhythm.     Heart sounds: Normal heart sounds.  Pulmonary:     Effort: Pulmonary effort is normal. No respiratory distress.     Breath sounds: Normal breath sounds.     Comments: Lungs are clear Musculoskeletal:         General: Normal range of motion.     Cervical back: Normal range of motion and neck supple.  Skin:    General: Skin is warm and dry.     Comments: Lumbar spine is straight and symmetric. Full  range of motion. No tenderness or muscle spasm. Strength, sensation, range of motion, and reflexes are normal in both lower extremities. Straight leg raise is negative bilateral.   Neurological:     General: No focal deficit present.     Mental Status: She is alert.     Gait: Gait normal.     Deep Tendon Reflexes: Reflexes normal.  Psychiatric:        Mood and Affect: Mood normal.        Behavior: Behavior normal.      UC Treatments / Results  Labs (all labs ordered are listed, but only abnormal results are displayed) Labs Reviewed - No data to display  EKG   Radiology No results found.  Procedures Procedures (including critical care time)  Medications Ordered in UC Medications - No data to display  Initial Impression / Assessment and Plan / UC Course  I have reviewed the triage vital signs and the nursing notes.  Pertinent labs & imaging results that were available during my care of the patient were reviewed by me and considered in my medical decision making (see chart for details).      Final Clinical Impressions(s) / UC Diagnoses   Final diagnoses:  Acute midline low back pain without sciatica  Shortness of breath     Discharge Instructions     Take the medrol as directed This is a steroid to reduce inflammation Take all of day one today Take the muscle relaxer as needed Warmth to low back Avoid bending and lifting activities    ED Prescriptions    Medication Sig Dispense Auth. Provider   methylPREDNISolone (MEDROL DOSEPAK) 4 MG TBPK tablet tad 21 tablet Raylene Everts, MD   tiZANidine (ZANAFLEX) 4 MG tablet Take 1-2 tablets (4-8 mg total) by mouth every 6 (six) hours as needed for muscle spasms. 21 tablet Raylene Everts, MD     PDMP not reviewed  this encounter.   Raylene Everts, MD 08/12/19 1250

## 2019-08-12 NOTE — ED Triage Notes (Signed)
Has had both COVID vaccines, last one was given 2/5

## 2019-08-15 DIAGNOSIS — M545 Low back pain: Secondary | ICD-10-CM | POA: Diagnosis not present

## 2019-08-24 ENCOUNTER — Telehealth: Payer: Self-pay | Admitting: *Deleted

## 2019-08-24 DIAGNOSIS — M6281 Muscle weakness (generalized): Secondary | ICD-10-CM | POA: Diagnosis not present

## 2019-08-24 DIAGNOSIS — M545 Low back pain: Secondary | ICD-10-CM | POA: Diagnosis not present

## 2019-08-24 DIAGNOSIS — S336XXD Sprain of sacroiliac joint, subsequent encounter: Secondary | ICD-10-CM | POA: Diagnosis not present

## 2019-08-24 DIAGNOSIS — M532X6 Spinal instabilities, lumbar region: Secondary | ICD-10-CM | POA: Diagnosis not present

## 2019-08-24 NOTE — Telephone Encounter (Signed)
Received call from patient.   States that she has stopped Cymbalta and tapered off Paxil. States that she is taking Wellbutrin. Reports that she feels much improved. States that she no longer feels agitated and aggressive.   Patient does report that she has increased hot flashes recently. Reports that she is having severe hot flashes where she is sleeping with her windows open in 20 degree weather.   MD please advise.

## 2019-08-26 NOTE — Telephone Encounter (Signed)
Increase Estrace to 1mg  once a day

## 2019-08-27 MED ORDER — ESTRADIOL 1 MG PO TABS: 1.0000 mg | ORAL_TABLET | Freq: Every day | ORAL | 3 refills | Status: DC

## 2019-08-27 NOTE — Telephone Encounter (Signed)
Patient returned call and made aware.   Agreeable to plan.   Prescription sent to pharmacy.  

## 2019-08-27 NOTE — Telephone Encounter (Signed)
Call placed to patient. LMTRC.  

## 2019-08-28 DIAGNOSIS — M545 Low back pain: Secondary | ICD-10-CM | POA: Diagnosis not present

## 2019-08-28 DIAGNOSIS — M532X6 Spinal instabilities, lumbar region: Secondary | ICD-10-CM | POA: Diagnosis not present

## 2019-08-28 DIAGNOSIS — M6281 Muscle weakness (generalized): Secondary | ICD-10-CM | POA: Diagnosis not present

## 2019-08-28 DIAGNOSIS — S336XXD Sprain of sacroiliac joint, subsequent encounter: Secondary | ICD-10-CM | POA: Diagnosis not present

## 2019-08-29 ENCOUNTER — Other Ambulatory Visit: Payer: Self-pay | Admitting: Family Medicine

## 2019-08-31 DIAGNOSIS — M532X6 Spinal instabilities, lumbar region: Secondary | ICD-10-CM | POA: Diagnosis not present

## 2019-08-31 DIAGNOSIS — M545 Low back pain: Secondary | ICD-10-CM | POA: Diagnosis not present

## 2019-08-31 DIAGNOSIS — M6281 Muscle weakness (generalized): Secondary | ICD-10-CM | POA: Diagnosis not present

## 2019-08-31 DIAGNOSIS — S336XXD Sprain of sacroiliac joint, subsequent encounter: Secondary | ICD-10-CM | POA: Diagnosis not present

## 2019-09-04 ENCOUNTER — Ambulatory Visit: Payer: BC Managed Care – PPO | Admitting: Family Medicine

## 2019-09-05 ENCOUNTER — Telehealth: Payer: Self-pay | Admitting: Family Medicine

## 2019-09-05 ENCOUNTER — Ambulatory Visit (INDEPENDENT_AMBULATORY_CARE_PROVIDER_SITE_OTHER): Payer: BC Managed Care – PPO | Admitting: Family Medicine

## 2019-09-05 ENCOUNTER — Encounter: Payer: Self-pay | Admitting: Family Medicine

## 2019-09-05 ENCOUNTER — Other Ambulatory Visit: Payer: Self-pay

## 2019-09-05 VITALS — BP 128/64 | HR 68 | Temp 98.4°F | Resp 16 | Ht 64.0 in | Wt 217.0 lb

## 2019-09-05 DIAGNOSIS — F321 Major depressive disorder, single episode, moderate: Secondary | ICD-10-CM | POA: Diagnosis not present

## 2019-09-05 DIAGNOSIS — G4733 Obstructive sleep apnea (adult) (pediatric): Secondary | ICD-10-CM | POA: Diagnosis not present

## 2019-09-05 DIAGNOSIS — Z6837 Body mass index (BMI) 37.0-37.9, adult: Secondary | ICD-10-CM

## 2019-09-05 HISTORY — DX: Obstructive sleep apnea (adult) (pediatric): G47.33

## 2019-09-05 MED ORDER — PHENTERMINE HCL 37.5 MG PO TABS: 37.5000 mg | ORAL_TABLET | Freq: Every day | ORAL | 1 refills | Status: DC

## 2019-09-05 MED ORDER — BUPROPION HCL ER (XL) 300 MG PO TB24: 300.0000 mg | ORAL_TABLET | Freq: Every day | ORAL | Status: DC

## 2019-09-05 NOTE — Assessment & Plan Note (Signed)
She is going to schedule with neurology to get her CPAP adjusted.

## 2019-09-05 NOTE — Telephone Encounter (Signed)
Call placed to patient.   Noted that out of pocket price is $15. States that she will pay out of pocket.

## 2019-09-05 NOTE — Telephone Encounter (Signed)
[facility] states that Phentermine isn't covered by her insurance. Can you send in an alternate prescription.  CB# [phone]

## 2019-09-05 NOTE — Telephone Encounter (Signed)
There is no alternative for this weight loss medication, that means her insurance does not cover weight los smeds , recommend she try [facility] or [facility]  We can send to either

## 2019-09-05 NOTE — Assessment & Plan Note (Addendum)
She is decided to stop her estrogen with regards to her hot flashes. Under 24-hour recall she is not getting in enough calories or protein.  We discussed this.  We will try her on a low-dose of phentermine one half of the 37.5 mg tablet as it is a stimulant to help with the fatigue as well that she experiences.  We will recheck her weight in 6 weeks.  Blood pressure heart rate are good

## 2019-09-05 NOTE — Assessment & Plan Note (Signed)
Major depression we will increase her Wellbutrin to 300 mg extended release once a day.  We will follow-up in 6 weeks

## 2019-09-05 NOTE — Patient Instructions (Addendum)
Eat more protein, with meals Try not to skip lunch  Increase wellbutrin to 300mg  oncce a day  Try the phentermine, take 1/2 tablet once a day  F/U 6 WEEKS

## 2019-09-05 NOTE — Telephone Encounter (Signed)
MD please advise

## 2019-09-05 NOTE — Progress Notes (Signed)
Subjective:    Patient ID: Rhonda [name], female    DOB: 21-Jul-1960, 59 y.o.   MRN: [id number]  Patient presents for Medication review   Pt now on wellbutrin, she came off Cymbalta and paxil , seems she is more tearful. She has a lot going on at her job and has a lot of stress on the job Her son's health has been declining She feels like she has been more snappy the past couple weeks.  She has been trying to have some stress relief by working in her garden.  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  Would like to increase the dose of her Wellbutrin   Obesity class 2- weight up 6lbs in the past month she decided to stop her estrogen altogether as she read that it would cause her to have more weight gain.  She does not feel like she eats very much likely does not eat enough.  24 hour recall- ate Dinner Asian salad from bag with cheese, corn baked chicken   Lunch- She skipped lunch  Breakfast- Bowel honey cheerios and milk, half a large pear,   Snack- apple peanut butter   In the past she has tried phentermine and Saxenda and had some success.  She had to stop the Saxenda due to GI upset    Hot flashes she has stopped estrogen and using cooling agents on the skin, cool wraps, fans, cool vest   HTN- she is taking atenolol    OSA needs CPAP machine adjusted with neurology     Review Of Systems:  GEN- denies fatigue, fever, weight loss,weakness, recent illness HEENT- denies eye drainage, change in vision, nasal discharge, CVS- denies chest pain, palpitations RESP- denies SOB, cough, wheeze ABD- denies N/V, change in stools, abd pain GU- denies dysuria, hematuria, dribbling, incontinence MSK- denies joint pain, muscle aches, injury Neuro- denies headache, dizziness, syncope, seizure activity       Objective:    BP 128/64   Pulse 68   Temp 98.4 F  (36.9 C) (Temporal)   Resp 16   Ht 5\' 4"  (1.626 m)   Wt 217 lb (98.4 kg)   SpO2 98%   BMI 37.25 kg/m  GEN- NAD, alert and oriented x3 HEENT- PERRL, EOMI, non injected sclera, pink conjunctiva Neck- Supple, no thyromegaly CVS- RRR, no murmur RESP-CTAB Psych normal affect and mood EXT- No edema Pulses- Radial 2+        Assessment & Plan:      Problem List Items Addressed This Visit      Unprioritized   Depression, major, single episode, moderate ([place]) - Primary    Major depression we will increase her Wellbutrin to 300 mg extended release once a day.  We will follow-up in 6 weeks      Relevant Medications   buPROPion (WELLBUTRIN XL) 300 MG 24 hr tablet   Obesity    She is decided to stop her estrogen with regards to her hot flashes. Under 24-hour recall she is not getting in enough calories or protein.  We discussed this.  We will try her on a low-dose of phentermine one half of the 37.5 mg tablet as it is a stimulant to help with the fatigue as well that she experiences.  We will recheck her weight in 6 weeks.  Blood pressure heart rate  are good      Relevant Medications   phentermine (ADIPEX-P) 37.5 MG tablet   OSA (obstructive sleep apnea)    She is going to schedule with neurology to get her CPAP adjusted.         Note: This dictation was prepared with Dragon dictation along with smaller phrase technology. Any transcriptional errors that result from this process are unintentional.

## 2019-09-06 DIAGNOSIS — M6281 Muscle weakness (generalized): Secondary | ICD-10-CM | POA: Diagnosis not present

## 2019-09-06 DIAGNOSIS — S336XXD Sprain of sacroiliac joint, subsequent encounter: Secondary | ICD-10-CM | POA: Diagnosis not present

## 2019-09-06 DIAGNOSIS — M532X6 Spinal instabilities, lumbar region: Secondary | ICD-10-CM | POA: Diagnosis not present

## 2019-09-06 DIAGNOSIS — M545 Low back pain: Secondary | ICD-10-CM | POA: Diagnosis not present

## 2019-09-07 ENCOUNTER — Ambulatory Visit: Payer: BLUE CROSS/BLUE SHIELD

## 2019-09-10 ENCOUNTER — Telehealth: Payer: Self-pay | Admitting: Family Medicine

## 2019-09-10 DIAGNOSIS — Z9989 Dependence on other enabling machines and devices: Secondary | ICD-10-CM

## 2019-09-10 DIAGNOSIS — G4733 Obstructive sleep apnea (adult) (pediatric): Secondary | ICD-10-CM

## 2019-09-10 NOTE — Telephone Encounter (Signed)
Pt is asking for a call to discuss her CPAP causing her to feel smothered and unable to sleep well

## 2019-09-10 NOTE — Telephone Encounter (Signed)
Please let her know that pressures look ok but she is requiring, on average, about 12.3cm of water pressure. If we lower her maximum pressure, I am scared that her AHI (apneic events) will worsen. She is now at 5.5 and goal is less than 5. Would she be willing to come back in for a titration study? This will allow [place] to find a good set pressure that she can tolerate. I will order if she is willing.

## 2019-09-10 NOTE — Telephone Encounter (Signed)
She was fine to go ahead and do this.  Please order.

## 2019-09-11 NOTE — Addendum Note (Signed)
Addended by: Shawnie Dapper L on: 09/11/2019 08:25 AM   Modules accepted: Orders

## 2019-09-12 ENCOUNTER — Other Ambulatory Visit: Payer: Self-pay | Admitting: Family Medicine

## 2019-09-12 ENCOUNTER — Telehealth: Payer: Self-pay

## 2019-09-12 DIAGNOSIS — G4733 Obstructive sleep apnea (adult) (pediatric): Secondary | ICD-10-CM

## 2019-09-12 NOTE — Telephone Encounter (Signed)
[employer] will not pay for a cpap titration with 100% compliance and AHI 5. She needs a mask fitting to help with leak from her DME company. She may do better on a set cpap pressure than Auto. Thanks

## 2019-09-12 NOTE — Addendum Note (Signed)
Addended by: Geronimo Running A on: 09/12/2019 08:10 AM   Modules accepted: Orders

## 2019-09-12 NOTE — Telephone Encounter (Signed)
Please let her know that insurance will not pay for titration study. We can try a different mask if she is willing. I could also change settings to see if that will help but will have to monitor AHI closely.

## 2019-09-12 NOTE — Telephone Encounter (Signed)
I called pt and relayed that the insurance would not pay for titration study.  AL/NP ok to try another mask if willing, or san try adjusting pressure, would have to watch AHI.  She has appt to see about new mask this week and will let us know how that goes and if needs to do other change.   She will let  us know.

## 2019-09-18 NOTE — Progress Notes (Signed)
Messaged [facility] for new cppa orders, received.

## 2019-09-19 ENCOUNTER — Other Ambulatory Visit: Payer: Self-pay | Admitting: Family Medicine

## 2019-09-19 DIAGNOSIS — M532X6 Spinal instabilities, lumbar region: Secondary | ICD-10-CM | POA: Diagnosis not present

## 2019-09-28 ENCOUNTER — Ambulatory Visit
Admission: RE | Admit: 2019-09-28 | Discharge: 2019-09-28 | Disposition: A | Discharge status: Home / Self Care | Payer: BC Managed Care – PPO | Source: Ambulatory Visit | Attending: Family Medicine | Admitting: Family Medicine

## 2019-09-28 ENCOUNTER — Other Ambulatory Visit: Payer: Self-pay

## 2019-09-28 DIAGNOSIS — Z1231 Encounter for screening mammogram for malignant neoplasm of breast: Secondary | ICD-10-CM

## 2019-10-05 DIAGNOSIS — G4733 Obstructive sleep apnea (adult) (pediatric): Secondary | ICD-10-CM | POA: Diagnosis not present

## 2019-10-08 ENCOUNTER — Other Ambulatory Visit: Payer: Self-pay | Admitting: *Deleted

## 2019-10-08 MED ORDER — BUPROPION HCL ER (XL) 300 MG PO TB24: 300.0000 mg | ORAL_TABLET | Freq: Every day | ORAL | 3 refills | Status: DC

## 2019-10-11 NOTE — Progress Notes (Addendum)
PATIENT: Rhonda [name] DOB: 1960/12/03  REASON FOR VISIT: follow up HISTORY FROM: patient  Chief Complaint  Patient presents with  . Follow-up    cpap fu, back rm     HISTORY OF PRESENT ILLNESS: Today 10/15/19 Rhonda [name] is a 59 y.o. female here today for follow up for OSA on CPAP. She is doing well. She has changed her mask and now using nasal pillow. She feels this is a much better fit for her. She feels much better on CPAP therapy.   Compliance report dated 09/11/2019 through 10/10/2019 reveals that she used CPAP 30 of the past 30 days for compliance of 100%.  She used CPAP greater than 4 hours 27 of the past 30 days for compliance of 90%.  Average usage was 5 hours minutes.  Residual AHI was 4.9 on 6 to 12 cm of water and an EPR of 3.  Leak has significantly improved and 95th percentile now 12.9.  HISTORY: (copied from  note on 07/16/2019)  Rhonda [name] is a 59 y.o. female here today for follow up. HST revealed severe OSA with AHI 52.3 and O2 nadir of 88%. She returns today for her initial CPAP compliance review.  She has noted significant improvement in sleep quality since starting CPAP therapy.  She is using CPAP nightly.  Her only concern is that she does note a leak around the nasal area of her mask.  She is using a DreamWear full facemask.  She is concerned that it may be the wrong size.  Otherwise she is doing very well  Compliance report dated 06/12/2019 through 07/11/2019 reveals that she used CPAP 30 out of the last 30 days for compliance of 100%.  She is CPAP greater than 4 hours all 30 days for compliance of 100%.  Average usage was 6 hours and 17 minutes.  Residual AHI was 6.2 on 6 to 12 cm of water and an EPR of 3.  Pressure in the 95th percentile of 11.8 with maximum 11.9.  A leak was noted in the 95th percentile of 22.6.   HISTORY: (copied from Dr Teofilo Pod note on 04/05/2019)  Dear Dr. Jeanice Lim, I saw your patient, Rhonda [name], upon your kind request to my sleep  clinic today for initial consultation of her sleep disorder, in particular, concern for underlying obstructive sleep apnea. The patient is unaccompanied today. As you know, his Rhonda [name] is a 59 year old right-handed woman with an underlying medical history of arthritis, history of carpal tunnel syndrome with status post surgery on the left, depression, hypertension, and obesity, who reports snoring and excessive daytime somnolence, nonrestorative sleep, morning headaches and witnessed apneas. The patient reports that she has been told by her daughter that she stops breathing and that snoring has been loud as reported by her daughter and also by a cousin. She does not wake up rested. She has lack of energy. She has an Epworth sleepiness score of 8 out of 24, fatigue severity score is 49 out of 63. She tries to nap on the weekend when she can catch up on sleep and may sleep for 3 to 4 hours during what started as a nap. 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 She has an uncle with sleep apnea. She has had trouble losing weight despite trying. She lives with her 81 year old mother who stays with her during the week, particularly to help out with patient's 37 year old oldest  son who has special needs. She has a set of twins, age 41, a son and a daughter, she has 60 74-year-old grandson. She has no night to night nocturia. She has woken up with the occasional headache, different from her migraines in the past. Her morning headaches are more dull and achy and pressure-like. She drinks caffeine in the form of coffee, typically 2 cups/day. She has had some food allergies but has not been formally tested. Sometimes when she eats tomatoes for example her tongue swells and she takes Benadryl. She is reminded to get allergy testing done for this. Her bedtime is generally between 930 and 1030. She falls asleep quickly but does not stay  asleep well. She has multiple nighttime awakenings. Her rise time is around 530. She has to be at work around 830.   REVIEW OF SYSTEMS: Out of a complete 14 system review of symptoms, the patient complains only of the following symptoms, none and all other reviewed systems are negative.  ESS: 10 FSS: 48  ALLERGIES: Allergies  Allergen Reactions  . Penicillins Hives        . Statins Other (See Comments)    MYALGIAS  . Lisinopril Cough    HOME MEDICATIONS: Outpatient Medications Prior to Visit  Medication Sig Dispense Refill  . amLODipine (NORVASC) 5 MG tablet TAKE 1 TABLET BY MOUTH EVERY DAY 90 tablet 1  . atenolol (TENORMIN) 50 MG tablet TAKE 1 TABLET BY MOUTH EVERY DAY (Patient taking differently: 25 mg. ) 90 tablet 1  . buPROPion (WELLBUTRIN XL) 300 MG 24 hr tablet Take 1 tablet (300 mg total) by mouth daily. 90 tablet 3  . cholecalciferol (VITAMIN D) 1000 units tablet Take 1,000 Units by mouth daily.    . clotrimazole-betamethasone (LOTRISONE) cream Apply 1 application topically 2 (two) times daily. 30 g 0  . estradiol (ESTRACE) 1 MG tablet TAKE 1 TABLET BY MOUTH EVERY DAY 90 tablet 2  . ezetimibe (ZETIA) 10 MG tablet Take 10 mg by mouth daily.    . fluticasone (FLONASE) 50 MCG/ACT nasal spray Place into both nostrils as needed.     . hydrochlorothiazide (HYDRODIURIL) 25 MG tablet TAKE 1 TABLET BY MOUTH EVERY DAY 90 tablet 2  . phentermine (ADIPEX-P) 37.5 MG tablet Take 1 tablet (37.5 mg total) by mouth daily before breakfast. 30 tablet 1  . tiZANidine (ZANAFLEX) 4 MG tablet Take 1-2 tablets (4-8 mg total) by mouth every 6 (six) hours as needed for muscle spasms. 21 tablet 0  . valACYclovir (VALTREX) 500 MG tablet Take 500 mg by mouth as needed.      No facility-administered medications prior to visit.    PAST MEDICAL HISTORY: Past Medical History:  Diagnosis Date  . Arthritis    knees  . Carpal tunnel syndrome of left wrist 07/2016  . Depression   . History of  MRSA infection > 10 years   right leg  . Hypertension    states under control with meds., has been on med. > 2 yr.  . OSA (obstructive sleep apnea) 09/05/2019    PAST SURGICAL HISTORY: Past Surgical History:  Procedure Laterality Date  . ABDOMINAL HYSTERECTOMY     partial  . BREAST EXCISIONAL BIOPSY    . CARPAL TUNNEL RELEASE Left 08/26/2016   Procedure: LEFT WRIST CARPAL TUNNEL RELEASE;  Surgeon: Ninetta Lights, MD;  Location: [facility];  Service: Orthopedics;  Laterality: Left;  . COLONOSCOPY WITH PROPOFOL  03/20/2014  . LAPAROSCOPIC UNILATERAL SALPINGECTOMY Right 05/18/2002  .  OVARY BIOPSY Right 05/18/2002  . TRIGGER FINGER RELEASE Left 02/10/2017   Procedure: LEFT TRIGGER THUMB RELEASE (TENDON SHEATH INCISION);  Surgeon: Loreta Ave, MD;  Location: [facility];  Service: Orthopedics;  Laterality: Left;    FAMILY HISTORY: Family History  Problem Relation Age of Onset  . Breast cancer Maternal Aunt     SOCIAL HISTORY: Social History   Socioeconomic History  . Marital status: Single    Spouse name: Not on file  . Number of children: Not on file  . Years of education: Not on file  . Highest education level: Not on file  Occupational History  . Not on file  Tobacco Use  . Smoking status: Never Smoker  . Smokeless tobacco: Never Used  Substance and Sexual Activity  . Alcohol use: No  . Drug use: No  . Sexual activity: Not on file  Other Topics Concern  . Not on file  Social History Narrative  . Not on file   Social Determinants of Health   Financial Resource Strain:   . Difficulty of Paying Living Expenses:   Food Insecurity:   . Worried About Programme researcher, broadcasting/film/video in the Last Year:   . Barista in the Last Year:   Transportation Needs:   . Freight forwarder (Medical):   Marland Kitchen Lack of Transportation (Non-Medical):   Physical Activity:   . Days of Exercise per Week:   . Minutes of Exercise per Session:   Stress:   .  Feeling of Stress :   Social Connections:   . Frequency of Communication with Friends and Family:   . Frequency of Social Gatherings with Friends and Family:   . Attends Religious Services:   . Active Member of Clubs or Organizations:   . Attends Banker Meetings:   Marland Kitchen Marital Status:   Intimate Partner Violence:   . Fear of Current or Ex-Partner:   . Emotionally Abused:   Marland Kitchen Physically Abused:   . Sexually Abused:       PHYSICAL EXAM  Vitals:   10/15/19 0807  BP: 130/82  Pulse: 80  Temp: (!) 97.3 F (36.3 C)  Weight: 210 lb (95.3 kg)  Height: 5' 4.5" (1.638 m)   Body mass index is 35.49 kg/m.  Generalized: Well developed, in no acute distress  Cardiology: normal rate and rhythm, no murmur noted Respiratory: clear to auscultation bilaterally  Neurological examination  Mentation: Alert oriented to time, place, history taking. Follows all commands speech and language fluent Cranial nerve II-XII: Pupils were equal round reactive to light. Extraocular movements were full, visual field were full  Motor: The motor testing reveals 5 over 5 strength of all 4 extremities. Good symmetric motor tone is noted throughout.  Gait and station: Gait is normal.   DIAGNOSTIC DATA (LABS, IMAGING, TESTING) - I reviewed patient records, labs, notes, testing and imaging myself where available.  No flowsheet data found.   Lab Results  Component Value Date   WBC 5.8 03/20/2019   HGB 13.4 03/20/2019   HCT 39.2 03/20/2019   MCV 90.3 03/20/2019   PLT 256 03/20/2019      Component Value Date/Time   NA 139 08/06/2019 0848   K 4.0 08/06/2019 0848   CL 104 08/06/2019 0848   CO2 28 08/06/2019 0848   GLUCOSE 100 (H) 08/06/2019 0848   BUN 13 08/06/2019 0848   CREATININE 0.76 08/06/2019 0848   CALCIUM 9.1 08/06/2019 0848   PROT 6.5  08/06/2019 0848   ALBUMIN 4.2 08/02/2016 1109   AST 19 08/06/2019 0848   ALT 16 08/06/2019 0848   ALKPHOS 59 08/02/2016 1109   BILITOT 0.3  08/06/2019 0848   GFRNONAA 88 02/02/2018 0939   GFRAA 102 02/02/2018 0939   Lab Results  Component Value Date   CHOL 208 (H) 08/06/2019   HDL 59 08/06/2019   LDLCALC 133 (H) 08/06/2019   TRIG 69 08/06/2019   CHOLHDL 3.5 08/06/2019   Lab Results  Component Value Date   HGBA1C 5.6 08/06/2019   Lab Results  Component Value Date   VITAMINB12 264 04/25/2007   Lab Results  Component Value Date   TSH 0.63 03/20/2019     ASSESSMENT AND PLAN 59 y.o. year old female  has a past medical history of Arthritis, Carpal tunnel syndrome of left wrist (07/2016), Depression, History of MRSA infection (> 10 years), Hypertension, and OSA (obstructive sleep apnea) (09/05/2019). here with     ICD-10-CM   1. OSA on CPAP  G47.33    Z99.89     Time he is doing well on CPAP therapy.  She feels that the nasal pillows is much more comfortable and user-friendly.  She reports significant improvement in sleep quality with using CPAP therapy.  Compliance report reveals excellent daily compliance and optimal 4-hour compliance.  She was encouraged to continue using CPAP nightly and for greater than 4 hours each night.  Healthy lifestyle habits encouraged.  She will follow-up with me in 1 year, sooner if needed.  She verbalizes understanding and agreement with this plan.   No orders of the defined types were placed in this encounter.    No orders of the defined types were placed in this encounter.     I spent 15 minutes with the patient. 50% of this time was spent counseling and educating patient on plan of care and medications.    Shawnie Dapper, FNP-C 10/15/2019, 8:23 AM [facility] [address] [place], [place] [address] [phone]  I reviewed the above note and documentation by the Nurse Practitioner and agree with the history, exam, assessment and plan as outlined above. I was available for consultation. Huston Foley, MD, PhD [facility])

## 2019-10-15 ENCOUNTER — Encounter: Payer: Self-pay | Admitting: Family Medicine

## 2019-10-15 ENCOUNTER — Ambulatory Visit: Payer: BC Managed Care – PPO | Admitting: Family Medicine

## 2019-10-15 ENCOUNTER — Other Ambulatory Visit: Payer: Self-pay

## 2019-10-15 VITALS — BP 130/82 | HR 80 | Temp 97.3°F | Ht 64.5 in | Wt 210.0 lb

## 2019-10-15 DIAGNOSIS — Z9989 Dependence on other enabling machines and devices: Secondary | ICD-10-CM

## 2019-10-15 DIAGNOSIS — G4733 Obstructive sleep apnea (adult) (pediatric): Secondary | ICD-10-CM | POA: Diagnosis not present

## 2019-10-15 NOTE — Patient Instructions (Signed)
Please continue using your CPAP regularly. While your insurance requires that you use CPAP at least 4 hours each night on 70% of the nights, I recommend, that you not skip any nights and use it throughout the night if you can. Getting used to CPAP and staying with the treatment long term does take time and patience and discipline. Untreated obstructive sleep apnea when it is moderate to severe can have an adverse impact on cardiovascular health and raise her risk for heart disease, arrhythmias, hypertension, congestive heart failure, stroke and diabetes. Untreated obstructive sleep apnea causes sleep disruption, nonrestorative sleep, and sleep deprivation. This can have an impact on your day to day functioning and cause daytime sleepiness and impairment of cognitive function, memory loss, mood disturbance, and problems focussing. Using CPAP regularly can improve these symptoms.   Follow up in 1 year, sooner if needed   CPAP and BPAP Information CPAP and BPAP are methods of helping a person breathe with the use of air pressure. CPAP stands for "continuous positive airway pressure." BPAP stands for "bi-level positive airway pressure." In both methods, air is blown through your nose or mouth and into your air passages to help you breathe well. CPAP and BPAP use different amounts of pressure to blow air. With CPAP, the amount of pressure stays the same while you breathe in and out. With BPAP, the amount of pressure is increased when you breathe in (inhale) so that you can take larger breaths. Your health care provider will recommend whether CPAP or BPAP would be more helpful for you. Why are CPAP and BPAP treatments used? CPAP or BPAP can be helpful if you have:  Sleep apnea.  Chronic obstructive pulmonary disease (COPD).  Heart failure.  Medical conditions that weaken the muscles of the chest including muscular dystrophy, or neurological diseases such as amyotrophic lateral sclerosis (ALS).  Other  problems that cause breathing to be weak, abnormal, or difficult. CPAP is most commonly used for obstructive sleep apnea (OSA) to keep the airways from collapsing when the muscles relax during sleep. How is CPAP or BPAP administered? Both CPAP and BPAP are provided by a small machine with a flexible plastic tube that attaches to a plastic mask. You wear the mask. Air is blown through the mask into your nose or mouth. The amount of pressure that is used to blow the air can be adjusted on the machine. Your health care provider will determine the pressure setting that should be used based on your individual needs. When should CPAP or BPAP be used? In most cases, the mask only needs to be worn during sleep. Generally, the mask needs to be worn throughout the night and during any daytime naps. People with certain medical conditions may also need to wear the mask at other times when they are awake. Follow instructions from your health care provider about when to use the machine. What are some tips for using the mask?   Because the mask needs to be snug, some people feel trapped or closed-in (claustrophobic) when first using the mask. If you feel this way, you may need to get used to the mask. One way to do this is by holding the mask loosely over your nose or mouth and then gradually applying the mask more snugly. You can also gradually increase the amount of time that you use the mask.  Masks are available in various types and sizes. Some fit over your mouth and nose while others fit over just your nose.  If your mask does not fit well, talk with your health care provider about getting a different one.  If you are using a mask that fits over your nose and you tend to breathe through your mouth, a chin strap may be applied to help keep your mouth closed.  The CPAP and BPAP machines have alarms that may sound if the mask comes off or develops a leak.  If you have trouble with the mask, it is very important  that you talk with your health care provider about finding a way to make the mask easier to tolerate. Do not stop using the mask. Stopping the use of the mask could have a negative impact on your health. What are some tips for using the machine?  Place your CPAP or BPAP machine on a secure table or stand near an electrical outlet.  Know where the on/off switch is located on the machine.  Follow instructions from your health care provider about how to set the pressure on your machine and when you should use it.  Do not eat or drink while the CPAP or BPAP machine is on. Food or fluids could get pushed into your lungs by the pressure of the CPAP or BPAP.  Do not smoke. Tobacco smoke residue can damage the machine.  For home use, CPAP and BPAP machines can be rented or purchased through home health care companies. Many different brands of machines are available. Renting a machine before purchasing may help you find out which particular machine works well for you.  Keep the CPAP or BPAP machine and attachments clean. Ask your health care provider for specific instructions. Get help right away if:  You have redness or open areas around your nose or mouth where the mask fits.  You have trouble using the CPAP or BPAP machine.  You cannot tolerate wearing the CPAP or BPAP mask.  You have pain, discomfort, and bloating in your abdomen. Summary  CPAP and BPAP are methods of helping a person breathe with the use of air pressure.  Both CPAP and BPAP are provided by a small machine with a flexible plastic tube that attaches to a plastic mask.  If you have trouble with the mask, it is very important that you talk with your health care provider about finding a way to make the mask easier to tolerate. This information is not intended to replace advice given to you by your health care provider. Make sure you discuss any questions you have with your health care provider. Document Revised: 10/04/2018  Document Reviewed: 05/03/2016 Elsevier Patient Education  [employer].

## 2019-10-17 ENCOUNTER — Ambulatory Visit: Payer: BC Managed Care – PPO | Admitting: Family Medicine

## 2019-10-29 ENCOUNTER — Other Ambulatory Visit: Payer: Self-pay

## 2019-10-29 ENCOUNTER — Encounter: Payer: Self-pay | Admitting: Family Medicine

## 2019-10-29 ENCOUNTER — Ambulatory Visit (INDEPENDENT_AMBULATORY_CARE_PROVIDER_SITE_OTHER): Payer: BC Managed Care – PPO | Admitting: Family Medicine

## 2019-10-29 VITALS — BP 134/72 | HR 90 | Temp 97.8°F | Resp 14 | Ht 64.5 in | Wt 213.0 lb

## 2019-10-29 DIAGNOSIS — Z6836 Body mass index (BMI) 36.0-36.9, adult: Secondary | ICD-10-CM

## 2019-10-29 DIAGNOSIS — F321 Major depressive disorder, single episode, moderate: Secondary | ICD-10-CM

## 2019-10-29 DIAGNOSIS — I10 Essential (primary) hypertension: Secondary | ICD-10-CM | POA: Diagnosis not present

## 2019-10-29 MED ORDER — PHENTERMINE HCL 37.5 MG PO TABS: 37.5000 mg | ORAL_TABLET | Freq: Every day | ORAL | 1 refills | Status: DC

## 2019-10-29 NOTE — Progress Notes (Signed)
Subjective:    Patient ID: Rhonda [name], female    DOB: 06-04-1961, 59 y.o.   MRN: [id number]  Patient presents for Follow-up (is fasting) Patient here to follow-up chronic medical problems.  Last visit was in March That time he had some changes to her medications here for her major depression her Wellbutrin was increased to 300 mg once a day.  She had stopped cymbalta herself at last visit, but due to increased back pain with Physical therapy, she restarted cymbalta 20mg . Her special needs son needs to have 2 surgeries, her boss is having some health problems so she is taking more of the work She is to quit her current job and take on another special needs child in which she can care for both her son and that person at home which she will enjoy.  States that she is always monitored there is no group home for people with special needs   Class III obesity we discussed nutrition changes as well as a low-dose of phentermine she was started on 1/2 tablet 37.5 mg once a day to also help with her chronic fatigue. She is trying to to eat more, but often skips meals, taking 1 full tablet of phentermine now, no SE with medication   Weight at that visit was 217lbs Today 213lbs   Labs from Feb reviewed , improved ldl to  133   Seen by neurology , CPAP updated       Review Of Systems:  GEN- denies fatigue, fever, weight loss,weakness, recent illness HEENT- denies eye drainage, change in vision, nasal discharge, CVS- denies chest pain, palpitations RESP- denies SOB, cough, wheeze ABD- denies N/V, change in stools, abd pain GU- denies dysuria, hematuria, dribbling, incontinence MSK- + joint pain, muscle aches, injury Neuro- denies headache, dizziness, syncope, seizure activity       Objective:    BP 134/72   Pulse 90   Temp 97.8 F (36.6 C) (Temporal)   Resp 14   Ht 5' 4.5" (1.638 m)   Wt 213 lb (96.6 kg)   SpO2 96%   BMI 36.00 kg/m  GEN- NAD, alert and oriented x3 CVS- RRR,  no murmur RESP-CTAB Psych- normal affect and mood Ext no edema  Pulses- Radial 2+        Assessment & Plan:      Problem List Items Addressed This Visit      Unprioritized   Depression, major, single episode, moderate (HCC)    Some improvement with higher dose of Wellbutrin but changing her job will be a significant factor.  No changes to doses      Relevant Medications   DULoxetine (CYMBALTA) 20 MG capsule   Essential hypertension, benign - Primary    Pressure is controlled.  There is no elevation with stimulant phentermine on board      Obesity    Class II obesity with comorbidities.  She has lost 4 pounds with the use of phentermine also on more weight neutral Wellbutrin.  She has been having significant change in her daily stressors as she is switching up her job to do something that she enjoys.  She also feels she will have more time to meal prep she will be working from home.  We will follow her up in 2 months to see how she continues to do with her weight.  Medications refilled      Relevant Medications   phentermine (ADIPEX-P) 37.5 MG tablet      Note: This  dictation was prepared with Dragon dictation along with smaller phrase technology. Any transcriptional errors that result from this process are unintentional.

## 2019-10-29 NOTE — Patient Instructions (Signed)
F/U 2 months - labs will be done

## 2019-10-29 NOTE — Assessment & Plan Note (Signed)
Class II obesity with comorbidities.  She has lost 4 pounds with the use of phentermine also on more weight neutral Wellbutrin.  She has been having significant change in her daily stressors as she is switching up her job to do something that she enjoys.  She also feels she will have more time to meal prep she will be working from home.  We will follow her up in 2 months to see how she continues to do with her weight.  Medications refilled

## 2019-10-29 NOTE — Assessment & Plan Note (Signed)
Pressure is controlled.  There is no elevation with stimulant phentermine on board

## 2019-10-29 NOTE — Assessment & Plan Note (Signed)
Some improvement with higher dose of Wellbutrin but changing her job will be a significant factor.  No changes to doses

## 2019-11-04 DIAGNOSIS — G4733 Obstructive sleep apnea (adult) (pediatric): Secondary | ICD-10-CM | POA: Diagnosis not present

## 2019-12-05 DIAGNOSIS — G4733 Obstructive sleep apnea (adult) (pediatric): Secondary | ICD-10-CM | POA: Diagnosis not present

## 2019-12-11 DIAGNOSIS — E782 Mixed hyperlipidemia: Secondary | ICD-10-CM | POA: Diagnosis not present

## 2019-12-11 DIAGNOSIS — N951 Menopausal and female climacteric states: Secondary | ICD-10-CM | POA: Diagnosis not present

## 2019-12-11 DIAGNOSIS — E611 Iron deficiency: Secondary | ICD-10-CM | POA: Diagnosis not present

## 2019-12-11 DIAGNOSIS — R7301 Impaired fasting glucose: Secondary | ICD-10-CM | POA: Diagnosis not present

## 2019-12-18 ENCOUNTER — Telehealth: Payer: Self-pay | Admitting: *Deleted

## 2019-12-18 NOTE — Telephone Encounter (Signed)
Received call from patient.   Reports that she was checking her medications and noted that she is still taking Paxil 20mg  PO QD in addition to Wellbutrin 300mg  PO QD.   Advised that she reported that she was taking 1/2 tab Paxil in Feb 2021, and then reported that she had stopped it it March 2021.  Inquired as to if she should remain on Paxil as she is feeling well.   MD please advise.

## 2019-12-18 NOTE — Telephone Encounter (Signed)
Keep taking the paxil 20mg  for now Okay to refill

## 2019-12-20 NOTE — Telephone Encounter (Signed)
Call placed to patient. No answer, no VM.  

## 2019-12-21 MED ORDER — PAROXETINE HCL 20 MG PO TABS: 20.0000 mg | ORAL_TABLET | Freq: Every day | ORAL | 1 refills | Status: DC

## 2019-12-21 NOTE — Addendum Note (Signed)
Addended by: Phillips Odor on: 12/21/2019 09:01 AM   Modules accepted: Orders

## 2019-12-21 NOTE — Telephone Encounter (Signed)
Call placed to patient and patient made aware.   Prescription sent to pharmacy.  

## 2019-12-24 DIAGNOSIS — Z1331 Encounter for screening for depression: Secondary | ICD-10-CM | POA: Diagnosis not present

## 2019-12-24 DIAGNOSIS — E611 Iron deficiency: Secondary | ICD-10-CM | POA: Diagnosis not present

## 2019-12-24 DIAGNOSIS — E782 Mixed hyperlipidemia: Secondary | ICD-10-CM | POA: Diagnosis not present

## 2019-12-24 DIAGNOSIS — Z1339 Encounter for screening examination for other mental health and behavioral disorders: Secondary | ICD-10-CM | POA: Diagnosis not present

## 2019-12-24 DIAGNOSIS — N951 Menopausal and female climacteric states: Secondary | ICD-10-CM | POA: Diagnosis not present

## 2019-12-24 DIAGNOSIS — R7301 Impaired fasting glucose: Secondary | ICD-10-CM | POA: Diagnosis not present

## 2020-01-01 DIAGNOSIS — Z6836 Body mass index (BMI) 36.0-36.9, adult: Secondary | ICD-10-CM | POA: Diagnosis not present

## 2020-01-01 DIAGNOSIS — E782 Mixed hyperlipidemia: Secondary | ICD-10-CM | POA: Diagnosis not present

## 2020-01-04 ENCOUNTER — Ambulatory Visit: Payer: BC Managed Care – PPO | Admitting: Family Medicine

## 2020-01-04 DIAGNOSIS — G4733 Obstructive sleep apnea (adult) (pediatric): Secondary | ICD-10-CM | POA: Diagnosis not present

## 2020-01-17 DIAGNOSIS — Z6835 Body mass index (BMI) 35.0-35.9, adult: Secondary | ICD-10-CM | POA: Diagnosis not present

## 2020-01-17 DIAGNOSIS — E782 Mixed hyperlipidemia: Secondary | ICD-10-CM | POA: Diagnosis not present

## 2020-01-19 ENCOUNTER — Other Ambulatory Visit: Payer: Self-pay | Admitting: Family Medicine

## 2020-01-25 DIAGNOSIS — N951 Menopausal and female climacteric states: Secondary | ICD-10-CM | POA: Diagnosis not present

## 2020-01-25 DIAGNOSIS — R232 Flushing: Secondary | ICD-10-CM | POA: Diagnosis not present

## 2020-01-25 DIAGNOSIS — E782 Mixed hyperlipidemia: Secondary | ICD-10-CM | POA: Diagnosis not present

## 2020-01-25 DIAGNOSIS — R7301 Impaired fasting glucose: Secondary | ICD-10-CM | POA: Diagnosis not present

## 2020-01-25 DIAGNOSIS — F419 Anxiety disorder, unspecified: Secondary | ICD-10-CM | POA: Diagnosis not present

## 2020-01-25 DIAGNOSIS — E611 Iron deficiency: Secondary | ICD-10-CM | POA: Diagnosis not present

## 2020-01-31 DIAGNOSIS — R7301 Impaired fasting glucose: Secondary | ICD-10-CM | POA: Diagnosis not present

## 2020-01-31 DIAGNOSIS — Z6835 Body mass index (BMI) 35.0-35.9, adult: Secondary | ICD-10-CM | POA: Diagnosis not present

## 2020-01-31 DIAGNOSIS — N951 Menopausal and female climacteric states: Secondary | ICD-10-CM | POA: Diagnosis not present

## 2020-02-04 DIAGNOSIS — G4733 Obstructive sleep apnea (adult) (pediatric): Secondary | ICD-10-CM | POA: Diagnosis not present

## 2020-02-06 ENCOUNTER — Other Ambulatory Visit: Payer: Self-pay | Admitting: Family Medicine

## 2020-02-06 DIAGNOSIS — I10 Essential (primary) hypertension: Secondary | ICD-10-CM

## 2020-02-13 ENCOUNTER — Other Ambulatory Visit: Payer: Self-pay | Admitting: Critical Care Medicine

## 2020-02-13 ENCOUNTER — Other Ambulatory Visit: Payer: BC Managed Care – PPO

## 2020-02-13 DIAGNOSIS — Z20822 Contact with and (suspected) exposure to covid-19: Secondary | ICD-10-CM

## 2020-02-14 LAB — SARS-COV-2, NAA 2 DAY TAT

## 2020-02-14 LAB — NOVEL CORONAVIRUS, NAA: SARS-CoV-2, NAA: NOT DETECTED

## 2020-02-22 DIAGNOSIS — N951 Menopausal and female climacteric states: Secondary | ICD-10-CM | POA: Diagnosis not present

## 2020-02-22 DIAGNOSIS — R7301 Impaired fasting glucose: Secondary | ICD-10-CM | POA: Diagnosis not present

## 2020-02-22 DIAGNOSIS — Z6835 Body mass index (BMI) 35.0-35.9, adult: Secondary | ICD-10-CM | POA: Diagnosis not present

## 2020-02-29 DIAGNOSIS — E782 Mixed hyperlipidemia: Secondary | ICD-10-CM | POA: Diagnosis not present

## 2020-02-29 DIAGNOSIS — Z6835 Body mass index (BMI) 35.0-35.9, adult: Secondary | ICD-10-CM | POA: Diagnosis not present

## 2020-03-07 DIAGNOSIS — Z6834 Body mass index (BMI) 34.0-34.9, adult: Secondary | ICD-10-CM | POA: Diagnosis not present

## 2020-03-07 DIAGNOSIS — E611 Iron deficiency: Secondary | ICD-10-CM | POA: Diagnosis not present

## 2020-03-14 DIAGNOSIS — R7301 Impaired fasting glucose: Secondary | ICD-10-CM | POA: Diagnosis not present

## 2020-03-14 DIAGNOSIS — Z6834 Body mass index (BMI) 34.0-34.9, adult: Secondary | ICD-10-CM | POA: Diagnosis not present

## 2020-03-16 ENCOUNTER — Other Ambulatory Visit: Payer: Self-pay | Admitting: Family Medicine

## 2020-03-21 DIAGNOSIS — I10 Essential (primary) hypertension: Secondary | ICD-10-CM | POA: Diagnosis not present

## 2020-03-21 DIAGNOSIS — Z6834 Body mass index (BMI) 34.0-34.9, adult: Secondary | ICD-10-CM | POA: Diagnosis not present

## 2020-03-21 DIAGNOSIS — G4733 Obstructive sleep apnea (adult) (pediatric): Secondary | ICD-10-CM | POA: Diagnosis not present

## 2020-03-21 DIAGNOSIS — E782 Mixed hyperlipidemia: Secondary | ICD-10-CM | POA: Diagnosis not present

## 2020-03-24 ENCOUNTER — Encounter: Payer: BC Managed Care – PPO | Admitting: Family Medicine

## 2020-03-31 DIAGNOSIS — G4733 Obstructive sleep apnea (adult) (pediatric): Secondary | ICD-10-CM | POA: Diagnosis not present

## 2020-04-04 DIAGNOSIS — E611 Iron deficiency: Secondary | ICD-10-CM | POA: Diagnosis not present

## 2020-04-04 DIAGNOSIS — Z6834 Body mass index (BMI) 34.0-34.9, adult: Secondary | ICD-10-CM | POA: Diagnosis not present

## 2020-04-07 ENCOUNTER — Other Ambulatory Visit: Payer: Self-pay

## 2020-04-07 ENCOUNTER — Ambulatory Visit (INDEPENDENT_AMBULATORY_CARE_PROVIDER_SITE_OTHER): Payer: BC Managed Care – PPO | Admitting: Family Medicine

## 2020-04-07 VITALS — BP 130/86 | HR 70 | Temp 98.1°F | Ht 64.0 in | Wt 207.0 lb

## 2020-04-07 DIAGNOSIS — F321 Major depressive disorder, single episode, moderate: Secondary | ICD-10-CM

## 2020-04-07 DIAGNOSIS — Z0001 Encounter for general adult medical examination with abnormal findings: Secondary | ICD-10-CM

## 2020-04-07 DIAGNOSIS — I10 Essential (primary) hypertension: Secondary | ICD-10-CM | POA: Diagnosis not present

## 2020-04-07 DIAGNOSIS — E7800 Pure hypercholesterolemia, unspecified: Secondary | ICD-10-CM | POA: Diagnosis not present

## 2020-04-07 DIAGNOSIS — Z23 Encounter for immunization: Secondary | ICD-10-CM | POA: Diagnosis not present

## 2020-04-07 DIAGNOSIS — Z6835 Body mass index (BMI) 35.0-35.9, adult: Secondary | ICD-10-CM

## 2020-04-07 DIAGNOSIS — Z0000 Encounter for general adult medical examination without abnormal findings: Secondary | ICD-10-CM

## 2020-04-07 LAB — CBC WITH DIFFERENTIAL/PLATELET
Absolute Monocytes: 515 cells/uL (ref 200–950)
Basophils Absolute: 20 cells/uL (ref 0–200)
Basophils Relative: 0.4 %
Eosinophils Absolute: 120 cells/uL (ref 15–500)
Eosinophils Relative: 2.4 %
HCT: 46.6 % — ABNORMAL HIGH (ref 35.0–45.0)
Hemoglobin: 15.3 g/dL (ref 11.7–15.5)
Lymphs Abs: 1665 cells/uL (ref 850–3900)
MCH: 29.5 pg (ref 27.0–33.0)
MCHC: 32.8 g/dL (ref 32.0–36.0)
MCV: 89.8 fL (ref 80.0–100.0)
MPV: 9.7 fL (ref 7.5–12.5)
Monocytes Relative: 10.3 %
Neutro Abs: 2680 cells/uL (ref 1500–7800)
Neutrophils Relative %: 53.6 %
Platelets: 244 10*3/uL (ref 140–400)
RBC: 5.19 10*6/uL — ABNORMAL HIGH (ref 3.80–5.10)
RDW: 13.4 % (ref 11.0–15.0)
Total Lymphocyte: 33.3 %
WBC: 5 10*3/uL (ref 3.8–10.8)

## 2020-04-07 LAB — COMPLETE METABOLIC PANEL WITH GFR
AG Ratio: 1.7 (calc) (ref 1.0–2.5)
ALT: 12 U/L (ref 6–29)
AST: 16 U/L (ref 10–35)
Albumin: 4.3 g/dL (ref 3.6–5.1)
Alkaline phosphatase (APISO): 63 U/L (ref 37–153)
BUN: 15 mg/dL (ref 7–25)
CO2: 31 mmol/L (ref 20–32)
Calcium: 9.6 mg/dL (ref 8.6–10.4)
Chloride: 104 mmol/L (ref 98–110)
Creat: 1.02 mg/dL (ref 0.50–1.05)
GFR, Est African American: 71 mL/min/{1.73_m2} (ref >=60)
GFR, Est Non African American: 61 mL/min/{1.73_m2} (ref >=60)
Globulin: 2.5 g/dL (calc) (ref 1.9–3.7)
Glucose, Bld: 99 mg/dL (ref 65–99)
Potassium: 4.1 mmol/L (ref 3.5–5.3)
Sodium: 142 mmol/L (ref 135–146)
Total Bilirubin: 0.5 mg/dL (ref 0.2–1.2)
Total Protein: 6.8 g/dL (ref 6.1–8.1)

## 2020-04-07 LAB — LIPID PANEL
Cholesterol: 183 mg/dL (ref <200)
HDL: 60 mg/dL (ref >=50)
LDL Cholesterol (Calc): 109 mg/dL (calc) — ABNORMAL HIGH
Non-HDL Cholesterol (Calc): 123 mg/dL (calc) (ref <130)
Total CHOL/HDL Ratio: 3.1 (calc) (ref <5.0)
Triglycerides: 62 mg/dL (ref <150)

## 2020-04-07 MED ORDER — VALSARTAN 80 MG PO TABS: 80.0000 mg | ORAL_TABLET | Freq: Every day | ORAL | 3 refills | Status: DC

## 2020-04-07 NOTE — Progress Notes (Signed)
Subjective:    Patient ID: Rhonda [name], female    DOB: 10-31-1960, 59 y.o.   MRN: [id number]  HPI Patient is a very pleasant 59 year old African-American female here today for complete physical exam.  She would like to try to consolidate some of her medications.  She is also working hard to try to lose weight and she feels that some of her medications are contributing to making it difficult.  First of all she feels tired and she is taking atenolol.  She is weaned herself down to 25 mg daily.  However her blood pressure at home remains around 140 systolic and around 90-92 diastolic.  Therefore I do not think that she can just stop the medication however we may be able to switch to something that would be more effective for her and potentially consolidate the atenolol and the amlodipine into one blood pressure pill.  Given the fact that she is an African-American female I think she would do very well on valsartan.  Her allergy to lisinopril was a cough and therefore I do not see any reason that she cannot try valsartan.  She also believes that some of her antidepressants may be causing her to have difficulty with weight loss.  She is on Cymbalta for pain prescribed by her orthopedist.  She is on Paxil and bupropion to help with depression.  The bupropion was added to the Paxil because the Paxil alone was ineffective.  She states that without the medication she has issues with anxiety and anger particularly at work.  She wonders if one medication can assume the role of both the Paxil and the bupropion.  She has never tried Trintellix.  Her mammogram is up-to-date.  Her colonoscopy is up-to-date.  She does not require a Pap smear as she has a history of a partial hysterectomy.  She is due for a flu shot today. Past Medical History:  Diagnosis Date  . Arthritis    knees  . Carpal tunnel syndrome of left wrist 07/2016  . Depression   . History of MRSA infection > 10 years   right leg  . Hypertension     states under control with meds., has been on med. > 2 yr.  . OSA (obstructive sleep apnea) 09/05/2019   Past Surgical History:  Procedure Laterality Date  . ABDOMINAL HYSTERECTOMY     partial  . BREAST EXCISIONAL BIOPSY    . CARPAL TUNNEL RELEASE Left 08/26/2016   Procedure: LEFT WRIST CARPAL TUNNEL RELEASE;  Surgeon: Loreta Ave, MD;  Location: [facility];  Service: Orthopedics;  Laterality: Left;  . COLONOSCOPY WITH PROPOFOL  03/20/2014  . LAPAROSCOPIC UNILATERAL SALPINGECTOMY Right 05/18/2002  . OVARY BIOPSY Right 05/18/2002  . TRIGGER FINGER RELEASE Left 02/10/2017   Procedure: LEFT TRIGGER THUMB RELEASE (TENDON SHEATH INCISION);  Surgeon: Loreta Ave, MD;  Location: [facility];  Service: Orthopedics;  Laterality: Left;   Current Outpatient Medications on File Prior to Visit  Medication Sig Dispense Refill  . amLODipine (NORVASC) 5 MG tablet TAKE 1 TABLET BY MOUTH EVERY DAY 90 tablet 1  . atenolol (TENORMIN) 50 MG tablet TAKE 1 TABLET BY MOUTH EVERY DAY 90 tablet 1  . buPROPion (WELLBUTRIN XL) 300 MG 24 hr tablet Take 1 tablet (300 mg total) by mouth daily. 90 tablet 3  . cholecalciferol (VITAMIN D) 1000 units tablet Take 1,000 Units by mouth daily.    . clotrimazole-betamethasone (LOTRISONE) cream Apply 1 application topically 2 (  two) times daily. 30 g 0  . DULoxetine (CYMBALTA) 20 MG capsule Take 20 mg by mouth daily.    Marland Kitchen. ezetimibe (ZETIA) 10 MG tablet TAKE 1 TABLET BY MOUTH EVERY DAY 90 tablet 1  . fluticasone (FLONASE) 50 MCG/ACT nasal spray Place into both nostrils as needed.     . hydrochlorothiazide (HYDRODIURIL) 25 MG tablet TAKE 1 TABLET BY MOUTH EVERY DAY 90 tablet 2  . PARoxetine (PAXIL) 20 MG tablet Take 1 tablet (20 mg total) by mouth daily. 90 tablet 1  . phentermine (ADIPEX-P) 37.5 MG tablet Take 1 tablet (37.5 mg total) by mouth daily before breakfast. 30 tablet 1  . tiZANidine (ZANAFLEX) 4 MG tablet Take 1-2 tablets (4-8 mg  total) by mouth every 6 (six) hours as needed for muscle spasms. 21 tablet 0  . valACYclovir (VALTREX) 500 MG tablet Take 500 mg by mouth as needed.      No current facility-administered medications on file prior to visit.   Allergies  Allergen Reactions  . Penicillins Hives        . Statins Other (See Comments)    MYALGIAS  . Lisinopril Cough   Social History   Socioeconomic History  . Marital status: Single    Spouse name: Not on file  . Number of children: Not on file  . Years of education: Not on file  . Highest education level: Not on file  Occupational History  . Not on file  Tobacco Use  . Smoking status: Never Smoker  . Smokeless tobacco: Never Used  Substance and Sexual Activity  . Alcohol use: No  . Drug use: No  . Sexual activity: Not on file  Other Topics Concern  . Not on file  Social History Narrative  . Not on file   Social Determinants of Health   Financial Resource Strain:   . Difficulty of Paying Living Expenses: Not on file  Food Insecurity:   . Worried About Programme researcher, broadcasting/film/videounning Out of Food in the Last Year: Not on file  . Ran Out of Food in the Last Year: Not on file  Transportation Needs:   . Lack of Transportation (Medical): Not on file  . Lack of Transportation (Non-Medical): Not on file  Physical Activity:   . Days of Exercise per Week: Not on file  . Minutes of Exercise per Session: Not on file  Stress:   . Feeling of Stress : Not on file  Social Connections:   . Frequency of Communication with Friends and Family: Not on file  . Frequency of Social Gatherings with Friends and Family: Not on file  . Attends Religious Services: Not on file  . Active Member of Clubs or Organizations: Not on file  . Attends BankerClub or Organization Meetings: Not on file  . Marital Status: Not on file  Intimate Partner Violence:   . Fear of Current or Ex-Partner: Not on file  . Emotionally Abused: Not on file  . Physically Abused: Not on file  . Sexually Abused: Not  on file   Family History  Problem Relation Age of Onset  . Breast cancer Maternal Aunt      Review of Systems  All other systems reviewed and are negative.      Objective:   Physical Exam Vitals reviewed.  Constitutional:      General: She is not in acute distress.    Appearance: Normal appearance. She is obese. She is not ill-appearing, toxic-appearing or diaphoretic.  HENT:  Head: Normocephalic and atraumatic.     Right Ear: Tympanic membrane, ear canal and external ear normal. There is no impacted cerumen.     Left Ear: Tympanic membrane, ear canal and external ear normal. There is no impacted cerumen.     Nose: Nose normal. No congestion or rhinorrhea.     Mouth/Throat:     Mouth: Mucous membranes are moist.     Pharynx: No oropharyngeal exudate or posterior oropharyngeal erythema.  Eyes:     General: No scleral icterus.       Right eye: No discharge.        Left eye: No discharge.     Extraocular Movements: Extraocular movements intact.     Conjunctiva/sclera: Conjunctivae normal.     Pupils: Pupils are equal, round, and reactive to light.  Neck:     Vascular: No carotid bruit.  Cardiovascular:     Rate and Rhythm: Normal rate and regular rhythm.     Pulses: Normal pulses.     Heart sounds: Normal heart sounds. No murmur heard.  No friction rub. No gallop.   Pulmonary:     Effort: Pulmonary effort is normal. No respiratory distress.     Breath sounds: Normal breath sounds. No stridor. No wheezing, rhonchi or rales.  Chest:     Chest wall: No tenderness.  Abdominal:     General: Abdomen is flat. There is no distension.     Palpations: Abdomen is soft. There is no mass.     Tenderness: There is no abdominal tenderness. There is no right CVA tenderness, left CVA tenderness, guarding or rebound.     Hernia: No hernia is present.  Musculoskeletal:        General: Normal range of motion.     Cervical back: Normal range of motion and neck supple. No rigidity or  tenderness.     Right lower leg: No edema.     Left lower leg: No edema.  Lymphadenopathy:     Cervical: No cervical adenopathy.  Skin:    General: Skin is warm.     Coloration: Skin is not jaundiced or pale.     Findings: No bruising, erythema, lesion or rash.  Neurological:     General: No focal deficit present.     Mental Status: She is alert and oriented to person, place, and time. Mental status is at baseline.     Cranial Nerves: No cranial nerve deficit.     Sensory: No sensory deficit.     Motor: No weakness.     Coordination: Coordination normal.     Gait: Gait normal.     Deep Tendon Reflexes: Reflexes normal.  Psychiatric:        Mood and Affect: Mood normal.        Behavior: Behavior normal.        Thought Content: Thought content normal.        Judgment: Judgment normal.           Assessment & Plan:  General medical exam - Plan: CBC with Differential/Platelet, COMPLETE METABOLIC PANEL WITH GFR, Lipid panel  Essential hypertension, benign - Plan: CBC with Differential/Platelet, COMPLETE METABOLIC PANEL WITH GFR, Lipid panel  Depression, major, single episode, moderate (HCC)  Pure hypercholesterolemia - Plan: CBC with Differential/Platelet, COMPLETE METABOLIC PANEL WITH GFR, Lipid panel  Class 2 severe obesity due to excess calories with serious comorbidity and body mass index (BMI) of 35.0 to 35.9 in adult [facility])  Step 1 we will wean  off the atenolol.  We will then replace it with valsartan 80 mg a day.  I would like her to give 2 weeks to allow this medication to adjust and then see how her blood pressures are.  If her systolic blood pressures less than 130, we may try to wean away from the amlodipine to consolidate her medication further.  Second we will discontinue the duloxetine.  She can stop this cold [place] as she is already on Paxil.  In 2 to 3 weeks once these medication changes are stable, I would like the patient to notify me how she is doing.  If she  feels fine I would discontinue Paxil and bupropion and replace with Trintellix.  I would first switch Paxil to Trintellix and then once the patient is stable I would discontinue bupropion.  Mammogram is up-to-date.  Colonoscopy is up-to-date.  Check CBC, CMP, fasting lipid panel.  Patient received her flu shot.

## 2020-04-08 ENCOUNTER — Encounter: Payer: Self-pay | Admitting: *Deleted

## 2020-04-11 ENCOUNTER — Encounter: Payer: Self-pay | Admitting: Family Medicine

## 2020-04-11 DIAGNOSIS — E782 Mixed hyperlipidemia: Secondary | ICD-10-CM | POA: Diagnosis not present

## 2020-04-11 DIAGNOSIS — R232 Flushing: Secondary | ICD-10-CM | POA: Diagnosis not present

## 2020-04-11 DIAGNOSIS — Z6834 Body mass index (BMI) 34.0-34.9, adult: Secondary | ICD-10-CM | POA: Diagnosis not present

## 2020-04-11 DIAGNOSIS — E611 Iron deficiency: Secondary | ICD-10-CM | POA: Diagnosis not present

## 2020-04-14 MED ORDER — POLYETHYLENE GLYCOL 3350 17 GM/SCOOP PO POWD: 17.0000 g | Freq: Two times a day (BID) | ORAL | 1 refills | Status: DC | PRN

## 2020-04-14 MED ORDER — DAILY VITAMIN/IRON PO TABS: ORAL_TABLET | ORAL | Status: AC

## 2020-04-14 MED ORDER — DIALYVITE VITAMIN D 5000 125 MCG (5000 UT) PO CAPS: 10000.0000 [IU] | ORAL_CAPSULE | Freq: Every day | ORAL | Status: AC

## 2020-04-22 ENCOUNTER — Encounter: Payer: Self-pay | Admitting: Family Medicine

## 2020-04-24 ENCOUNTER — Other Ambulatory Visit: Payer: Self-pay | Admitting: Family Medicine

## 2020-04-24 MED ORDER — VALSARTAN 80 MG PO TABS
80.0000 mg | ORAL_TABLET | Freq: Every day | ORAL | 3 refills | Status: DC
Start: 2020-04-24 — End: 2020-06-12

## 2020-05-02 ENCOUNTER — Other Ambulatory Visit: Payer: Self-pay | Admitting: Family Medicine

## 2020-05-02 DIAGNOSIS — Z6834 Body mass index (BMI) 34.0-34.9, adult: Secondary | ICD-10-CM | POA: Diagnosis not present

## 2020-05-02 DIAGNOSIS — R7301 Impaired fasting glucose: Secondary | ICD-10-CM | POA: Diagnosis not present

## 2020-05-02 MED ORDER — DESVENLAFAXINE SUCCINATE ER 50 MG PO TB24: 50.0000 mg | ORAL_TABLET | Freq: Every day | ORAL | 3 refills | Status: DC

## 2020-05-16 ENCOUNTER — Other Ambulatory Visit: Payer: Self-pay | Admitting: Family Medicine

## 2020-05-16 DIAGNOSIS — Z6834 Body mass index (BMI) 34.0-34.9, adult: Secondary | ICD-10-CM | POA: Diagnosis not present

## 2020-05-16 DIAGNOSIS — N951 Menopausal and female climacteric states: Secondary | ICD-10-CM | POA: Diagnosis not present

## 2020-05-16 DIAGNOSIS — E611 Iron deficiency: Secondary | ICD-10-CM | POA: Diagnosis not present

## 2020-05-17 DIAGNOSIS — Z23 Encounter for immunization: Secondary | ICD-10-CM | POA: Diagnosis not present

## 2020-05-25 ENCOUNTER — Other Ambulatory Visit: Payer: Self-pay | Admitting: Family Medicine

## 2020-05-30 DIAGNOSIS — Z6835 Body mass index (BMI) 35.0-35.9, adult: Secondary | ICD-10-CM | POA: Diagnosis not present

## 2020-05-30 DIAGNOSIS — N951 Menopausal and female climacteric states: Secondary | ICD-10-CM | POA: Diagnosis not present

## 2020-05-30 DIAGNOSIS — R7301 Impaired fasting glucose: Secondary | ICD-10-CM | POA: Diagnosis not present

## 2020-06-09 DIAGNOSIS — L811 Chloasma: Secondary | ICD-10-CM | POA: Diagnosis not present

## 2020-06-09 DIAGNOSIS — L700 Acne vulgaris: Secondary | ICD-10-CM | POA: Diagnosis not present

## 2020-06-10 ENCOUNTER — Other Ambulatory Visit: Payer: Self-pay | Admitting: Family Medicine

## 2020-06-12 ENCOUNTER — Telehealth: Payer: Self-pay | Admitting: Family Medicine

## 2020-06-12 MED ORDER — VALSARTAN 80 MG PO TABS
80.0000 mg | ORAL_TABLET | Freq: Every day | ORAL | 3 refills | Status: DC
Start: 2020-06-12 — End: 2020-06-13

## 2020-06-12 NOTE — Telephone Encounter (Signed)
Refill Valsartan been out since Tuesday

## 2020-06-12 NOTE — Telephone Encounter (Signed)
Prescription sent to pharmacy.

## 2020-06-13 ENCOUNTER — Other Ambulatory Visit: Payer: Self-pay

## 2020-06-13 MED ORDER — VALSARTAN 80 MG PO TABS
80.0000 mg | ORAL_TABLET | Freq: Every day | ORAL | 3 refills | Status: DC
Start: 2020-06-13 — End: 2020-06-16

## 2020-06-16 ENCOUNTER — Other Ambulatory Visit: Payer: Self-pay

## 2020-06-16 ENCOUNTER — Ambulatory Visit (INDEPENDENT_AMBULATORY_CARE_PROVIDER_SITE_OTHER): Payer: BC Managed Care – PPO | Admitting: Family Medicine

## 2020-06-16 VITALS — BP 140/90 | HR 89 | Temp 98.1°F | Ht 64.0 in

## 2020-06-16 DIAGNOSIS — N951 Menopausal and female climacteric states: Secondary | ICD-10-CM | POA: Diagnosis not present

## 2020-06-16 DIAGNOSIS — R7301 Impaired fasting glucose: Secondary | ICD-10-CM | POA: Diagnosis not present

## 2020-06-16 DIAGNOSIS — I10 Essential (primary) hypertension: Secondary | ICD-10-CM

## 2020-06-16 DIAGNOSIS — F321 Major depressive disorder, single episode, moderate: Secondary | ICD-10-CM | POA: Diagnosis not present

## 2020-06-16 MED ORDER — DESVENLAFAXINE SUCCINATE ER 50 MG PO TB24
50.0000 mg | ORAL_TABLET | Freq: Every day | ORAL | 2 refills | Status: DC
Start: 2020-06-16 — End: 2021-05-20

## 2020-06-16 MED ORDER — VALSARTAN 80 MG PO TABS
160.0000 mg | ORAL_TABLET | Freq: Every day | ORAL | 3 refills | Status: DC
Start: 2020-06-16 — End: 2020-06-16

## 2020-06-16 MED ORDER — VALSARTAN 320 MG PO TABS: 320.0000 mg | ORAL_TABLET | Freq: Every day | ORAL | 3 refills | Status: DC

## 2020-06-16 MED ORDER — VALSARTAN 160 MG PO TABS: 160.0000 mg | ORAL_TABLET | Freq: Every day | ORAL | 3 refills | Status: DC

## 2020-06-16 NOTE — Progress Notes (Signed)
Subjective:    Patient ID: Rhonda [name], female    DOB: 1960-11-01, 59 y.o.   MRN: [id number]  HPI Since her last visit, we discontinue Paxil and Cymbalta and switched her to Pristiq 50 mg a day.  She still on Wellbutrin.  We also increased her valsartan to 160 mg a day.  Her blood pressure is doing better but is still not properly controlled.  Systolic blood pressures are in the 140s.  Diastolic blood pressures are in the 80s to 90s.  She also reports that the depression is no better.  She reports anhedonia.  She states that she is normally a Christmas person but she just feels "flat".  She denies suicidal ideation.  She denies any delusions.  She denies any hallucinations.  She is able to get out of bed.  She does have energy.  She lacks motivation.  We discussed switching Wellbutrin to Rexulti but the patient is not quite ready to make that change yet. Past Medical History:  Diagnosis Date  . Arthritis    knees  . Carpal tunnel syndrome of left wrist 07/2016  . Depression   . History of MRSA infection > 10 years   right leg  . Hypertension    states under control with meds., has been on med. > 2 yr.  . OSA (obstructive sleep apnea) 09/05/2019   Past Surgical History:  Procedure Laterality Date  . ABDOMINAL HYSTERECTOMY     partial  . BREAST EXCISIONAL BIOPSY    . CARPAL TUNNEL RELEASE Left 08/26/2016   Procedure: LEFT WRIST CARPAL TUNNEL RELEASE;  Surgeon: Loreta Ave, MD;  Location: [facility];  Service: Orthopedics;  Laterality: Left;  . COLONOSCOPY WITH PROPOFOL  03/20/2014  . LAPAROSCOPIC UNILATERAL SALPINGECTOMY Right 05/18/2002  . OVARY BIOPSY Right 05/18/2002  . TRIGGER FINGER RELEASE Left 02/10/2017   Procedure: LEFT TRIGGER THUMB RELEASE (TENDON SHEATH INCISION);  Surgeon: Loreta Ave, MD;  [facility];  Service: Orthopedics;  Laterality: Left;   Current Outpatient Medications on File Prior to Visit  Medication Sig  Dispense Refill  . buPROPion (WELLBUTRIN XL) 300 MG 24 hr tablet Take 1 tablet (300 mg total) by mouth daily. 90 tablet 3  . Cholecalciferol (DIALYVITE VITAMIN D 5000) 125 MCG (5000 UT) capsule Take 2 capsules (10,000 Units total) by mouth daily.    Marland Kitchen ezetimibe (ZETIA) 10 MG tablet TAKE 1 TABLET BY MOUTH EVERY DAY 90 tablet 1  . fluticasone (FLONASE) 50 MCG/ACT nasal spray Place into both nostrils as needed.     . hydrochlorothiazide (HYDRODIURIL) 25 MG tablet TAKE 1 TABLET BY MOUTH EVERY DAY 90 tablet 2  . Multiple Vitamins-Iron (DAILY VITAMIN/IRON) TABS Chelate Iron    . polyethylene glycol powder (GLYCOLAX/MIRALAX) 17 GM/SCOOP powder Take 17 g by mouth 2 (two) times daily as needed. 3350 g 1  . progesterone (PROMETRIUM) 100 MG capsule Take by mouth at bedtime.    Marland Kitchen PARoxetine (PAXIL) 20 MG tablet Take 1 tablet (20 mg total) by mouth daily. (Patient not taking: Reported on 06/16/2020) 90 tablet 1   No current facility-administered medications on file prior to visit.   Allergies  Allergen Reactions  . Penicillins Hives        . Statins Other (See Comments)    MYALGIAS  . Lisinopril Cough   Social History   Socioeconomic History  . Marital status: Single    Spouse name: Not on file  . Number of children: Not  on file  . Years of education: Not on file  . Highest education level: Not on file  Occupational History  . Not on file  Tobacco Use  . Smoking status: Never Smoker  . Smokeless tobacco: Never Used  Substance and Sexual Activity  . Alcohol use: No  . Drug use: No  . Sexual activity: Not on file  Other Topics Concern  . Not on file  Social History Narrative  . Not on file   Social Determinants of Health   Financial Resource Strain: Not on file  Food Insecurity: Not on file  Transportation Needs: Not on file  Physical Activity: Not on file  Stress: Not on file  Social Connections: Not on file  Intimate Partner Violence: Not on file   Family History  Problem  Relation Age of Onset  . Breast cancer Maternal Aunt      Review of Systems  All other systems reviewed and are negative.      Objective:   Physical Exam Vitals reviewed.  Constitutional:      General: She is not in acute distress.    Appearance: Normal appearance. She is obese. She is not ill-appearing, toxic-appearing or diaphoretic.  HENT:     Head: Normocephalic and atraumatic.  Cardiovascular:     Rate and Rhythm: Normal rate and regular rhythm.     Heart sounds: Normal heart sounds.  Pulmonary:     Effort: Pulmonary effort is normal. No respiratory distress.     Breath sounds: Normal breath sounds. No stridor. No wheezing, rhonchi or rales.  Chest:     Chest wall: No tenderness.  Abdominal:     General: Abdomen is flat.  Neurological:     Mental Status: She is alert.  Psychiatric:        Mood and Affect: Mood normal.        Behavior: Behavior normal.        Thought Content: Thought content normal.        Judgment: Judgment normal.           Assessment & Plan:  Essential hypertension, benign  Depression, major, single episode, moderate (HCC)  Blood pressures not adequately controlled.  Increase valsartan to 320 mg daily.  We discussed switching Wellbutrin to Rexulti as an adjunctive to her Pristiq.  However the patient would like to continue and monitor this for the present time before making any additional changes in her medication.  I believe this is prudent on her part.

## 2020-07-01 DIAGNOSIS — G4733 Obstructive sleep apnea (adult) (pediatric): Secondary | ICD-10-CM | POA: Diagnosis not present

## 2020-07-04 DIAGNOSIS — G4733 Obstructive sleep apnea (adult) (pediatric): Secondary | ICD-10-CM | POA: Diagnosis not present

## 2020-07-07 DIAGNOSIS — Z6835 Body mass index (BMI) 35.0-35.9, adult: Secondary | ICD-10-CM | POA: Diagnosis not present

## 2020-07-07 DIAGNOSIS — E611 Iron deficiency: Secondary | ICD-10-CM | POA: Diagnosis not present

## 2020-07-21 DIAGNOSIS — Z6834 Body mass index (BMI) 34.0-34.9, adult: Secondary | ICD-10-CM | POA: Diagnosis not present

## 2020-07-21 DIAGNOSIS — G4730 Sleep apnea, unspecified: Secondary | ICD-10-CM | POA: Diagnosis not present

## 2020-07-21 DIAGNOSIS — E782 Mixed hyperlipidemia: Secondary | ICD-10-CM | POA: Diagnosis not present

## 2020-08-05 DIAGNOSIS — H25013 Cortical age-related cataract, bilateral: Secondary | ICD-10-CM | POA: Diagnosis not present

## 2020-08-08 DIAGNOSIS — G4730 Sleep apnea, unspecified: Secondary | ICD-10-CM | POA: Diagnosis not present

## 2020-08-08 DIAGNOSIS — E782 Mixed hyperlipidemia: Secondary | ICD-10-CM | POA: Diagnosis not present

## 2020-08-08 DIAGNOSIS — Z6833 Body mass index (BMI) 33.0-33.9, adult: Secondary | ICD-10-CM | POA: Diagnosis not present

## 2020-08-08 DIAGNOSIS — R232 Flushing: Secondary | ICD-10-CM | POA: Diagnosis not present

## 2020-08-24 DIAGNOSIS — R0781 Pleurodynia: Secondary | ICD-10-CM | POA: Diagnosis not present

## 2020-08-29 ENCOUNTER — Other Ambulatory Visit: Payer: Self-pay | Admitting: Family Medicine

## 2020-08-29 DIAGNOSIS — Z1231 Encounter for screening mammogram for malignant neoplasm of breast: Secondary | ICD-10-CM

## 2020-09-01 DIAGNOSIS — L700 Acne vulgaris: Secondary | ICD-10-CM | POA: Diagnosis not present

## 2020-09-01 DIAGNOSIS — L811 Chloasma: Secondary | ICD-10-CM | POA: Diagnosis not present

## 2020-09-05 DIAGNOSIS — I10 Essential (primary) hypertension: Secondary | ICD-10-CM | POA: Diagnosis not present

## 2020-09-05 DIAGNOSIS — Z6833 Body mass index (BMI) 33.0-33.9, adult: Secondary | ICD-10-CM | POA: Diagnosis not present

## 2020-09-05 DIAGNOSIS — E559 Vitamin D deficiency, unspecified: Secondary | ICD-10-CM | POA: Diagnosis not present

## 2020-09-19 DIAGNOSIS — K121 Other forms of stomatitis: Secondary | ICD-10-CM | POA: Diagnosis not present

## 2020-09-19 DIAGNOSIS — J301 Allergic rhinitis due to pollen: Secondary | ICD-10-CM | POA: Diagnosis not present

## 2020-10-09 DIAGNOSIS — G4733 Obstructive sleep apnea (adult) (pediatric): Secondary | ICD-10-CM | POA: Diagnosis not present

## 2020-10-14 NOTE — Progress Notes (Addendum)
PATIENT: Rhonda [name] DOB: Mar 27, 1961  REASON FOR VISIT: follow up HISTORY FROM: patient  Chief Complaint  Patient presents with  . Follow-up    RM 1 alone Pt is well, CPAP helps her sleep. Sleeps throughout the night, she is pleased with it      HISTORY OF PRESENT ILLNESS: 10/15/20 ALL:  She returns for follow up for OSA on CPAP. She continues to do very well on therapy. She is sleeping more soundly and feels better rested. She is not having to take naps during the day. She is caring for her child with special needs and recently started caring for an adult friend with special needs. She admits that sometimes she falls asleep in her chair reading and forgets to put CPAP mask on. Otherwise, doing very well.      10/15/2019 ALL:  Rhonda [name] is a 60 y.o. female here today for follow up for OSA on CPAP. She is doing well. She has changed her mask and now using nasal pillow. She feels this is a much better fit for her. She feels much better on CPAP therapy.   Compliance report dated 09/11/2019 through 10/10/2019 reveals that she used CPAP 30 of the past 30 days for compliance of 100%.  She used CPAP greater than 4 hours 27 of the past 30 days for compliance of 90%.  Average usage was 5 hours minutes.  Residual AHI was 4.9 on 6 to 12 cm of water and an EPR of 3.  Leak has significantly improved and 95th percentile now 12.9.  HISTORY: (copied from  note on 07/16/2019)  Rhonda [name] is a 60 y.o. female here today for follow up. HST revealed severe OSA with AHI 52.3 and O2 nadir of 88%. She returns today for her initial CPAP compliance review.  She has noted significant improvement in sleep quality since starting CPAP therapy.  She is using CPAP nightly.  Her only concern is that she does note a leak around the nasal area of her mask.  She is using a DreamWear full facemask.  She is concerned that it may be the wrong size.  Otherwise she is doing very well  Compliance report  dated 06/12/2019 through 07/11/2019 reveals that she used CPAP 30 out of the last 30 days for compliance of 100%.  She is CPAP greater than 4 hours all 30 days for compliance of 100%.  Average usage was 6 hours and 17 minutes.  Residual AHI was 6.2 on 6 to 12 cm of water and an EPR of 3.  Pressure in the 95th percentile of 11.8 with maximum 11.9.  A leak was noted in the 95th percentile of 22.6.   HISTORY: (copied from Dr Teofilo Pod note on 04/05/2019)  Dear Dr. Jeanice Lim, I saw your patient, Rhonda [name], upon your kind request to my sleep clinic today for initial consultation of her sleep disorder, in particular, concern for underlying obstructive sleep apnea. The patient is unaccompanied today. As you know, his [name] is a 60 year old right-handed woman with an underlying medical history of arthritis, history of carpal tunnel syndrome with status post surgery on the left, depression, hypertension, and obesity, who reports snoring and excessive daytime somnolence, nonrestorative sleep, morning headaches and witnessed apneas. The patient reports that she has been told by her daughter that she stops breathing and that snoring has been loud as reported by her daughter and also by a cousin. She does not wake up rested. She has lack of  energy. She has an Epworth sleepiness score of 8 out of 24, fatigue severity score is 49 out of 63. She tries to nap on the weekend when she can catch up on sleep and may sleep for 3 to 4 hours during what started as a nap. 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 She has an uncle with sleep apnea. She has had trouble losing weight despite trying. She lives with her 100 year old mother who stays with her during the week, particularly to help out with patient's 44 year old oldest son who has special needs. She has a set of twins, age 38, a son and a daughter, she has 84 23-year-old grandson. She has no night  to night nocturia. She has woken up with the occasional headache, different from her migraines in the past. Her morning headaches are more dull and achy and pressure-like. She drinks caffeine in the form of coffee, typically 2 cups/day. She has had some food allergies but has not been formally tested. Sometimes when she eats tomatoes for example her tongue swells and she takes Benadryl. She is reminded to get allergy testing done for this. Her bedtime is generally between 930 and 1030. She falls asleep quickly but does not stay asleep well. She has multiple nighttime awakenings. Her rise time is around 530. She has to be at work around 830.   REVIEW OF SYSTEMS: Out of a complete 14 system review of symptoms, the patient complains only of the following symptoms, none and all other reviewed systems are negative.  ESS: 6  ALLERGIES: Allergies  Allergen Reactions  . Penicillins Hives        . Statins Other (See Comments)    MYALGIAS  . Lisinopril Cough    HOME MEDICATIONS: Outpatient Medications Prior to Visit  Medication Sig Dispense Refill  . buPROPion (WELLBUTRIN XL) 300 MG 24 hr tablet Take 1 tablet (300 mg total) by mouth daily. 90 tablet 3  . Cholecalciferol (DIALYVITE VITAMIN D 5000) 125 MCG (5000 UT) capsule Take 2 capsules (10,000 Units total) by mouth daily.    Marland Kitchen desvenlafaxine (PRISTIQ) 50 MG 24 hr tablet Take 1 tablet (50 mg total) by mouth daily. Stop paxil and cymbalta 90 tablet 2  . ezetimibe (ZETIA) 10 MG tablet TAKE 1 TABLET BY MOUTH EVERY DAY 90 tablet 1  . fluticasone (FLONASE) 50 MCG/ACT nasal spray Place into both nostrils as needed.     . hydrochlorothiazide (HYDRODIURIL) 25 MG tablet TAKE 1 TABLET BY MOUTH EVERY DAY 90 tablet 2  . Multiple Vitamins-Iron (DAILY VITAMIN/IRON) TABS Chelate Iron    . polyethylene glycol powder (GLYCOLAX/MIRALAX) 17 GM/SCOOP powder Take 17 g by mouth 2 (two) times daily as needed. 3350 g 1  . progesterone (PROMETRIUM) 100 MG  capsule Take by mouth at bedtime.    . valsartan (DIOVAN) 320 MG tablet Take 1 tablet (320 mg total) by mouth daily. 90 tablet 3  . PARoxetine (PAXIL) 20 MG tablet Take 1 tablet (20 mg total) by mouth daily. (Patient not taking: Reported on 06/16/2020) 90 tablet 1   No facility-administered medications prior to visit.    PAST MEDICAL HISTORY: Past Medical History:  Diagnosis Date  . Arthritis    knees  . Carpal tunnel syndrome of left wrist 07/2016  . Depression   . History of MRSA infection > 10 years   right leg  . Hypertension    states under control with meds., has  been on med. > 2 yr.  . OSA (obstructive sleep apnea) 09/05/2019    PAST SURGICAL HISTORY: Past Surgical History:  Procedure Laterality Date  . ABDOMINAL HYSTERECTOMY     partial  . BREAST EXCISIONAL BIOPSY    . CARPAL TUNNEL RELEASE Left 08/26/2016   Procedure: LEFT WRIST CARPAL TUNNEL RELEASE;  Surgeon: Loreta Aveaniel F Murphy, MD;  Location: [facility];  Service: Orthopedics;  Laterality: Left;  . COLONOSCOPY WITH PROPOFOL  03/20/2014  . LAPAROSCOPIC UNILATERAL SALPINGECTOMY Right 05/18/2002  . OVARY BIOPSY Right 05/18/2002  . TRIGGER FINGER RELEASE Left 02/10/2017   Procedure: LEFT TRIGGER THUMB RELEASE (TENDON SHEATH INCISION);  Surgeon: Loreta AveMurphy, Daniel F, MD;  Location: [facility];  Service: Orthopedics;  Laterality: Left;    FAMILY HISTORY: Family History  Problem Relation Age of Onset  . Breast cancer Maternal Aunt     SOCIAL HISTORY: Social History   Socioeconomic History  . Marital status: Single    Spouse name: Not on file  . Number of children: Not on file  . Years of education: Not on file  . Highest education level: Not on file  Occupational History  . Not on file  Tobacco Use  . Smoking status: Never Smoker  . Smokeless tobacco: Never Used  Substance and Sexual Activity  . Alcohol use: No  . Drug use: No  . Sexual activity: Not on file  Other Topics Concern  .  Not on file  Social History Narrative  . Not on file   Social Determinants of Health   Financial Resource Strain: Not on file  Food Insecurity: Not on file  Transportation Needs: Not on file  Physical Activity: Not on file  Stress: Not on file  Social Connections: Not on file  Intimate Partner Violence: Not on file      PHYSICAL EXAM  Vitals:   10/15/20 0747  BP: (!) 129/91  Pulse: 77  Weight: 203 lb (92.1 kg)  Height: 5\' 5"  (1.651 m)   Body mass index is 33.78 kg/m.  Generalized: Well developed, in no acute distress  Cardiology: normal rate and rhythm, no murmur noted Respiratory: clear to auscultation bilaterally  Neurological examination  Mentation: Alert oriented to time, place, history taking. Follows all commands speech and language fluent Cranial nerve II-XII: Pupils were equal round reactive to light. Extraocular movements were full, visual field were full  Motor: The motor testing reveals 5 over 5 strength of all 4 extremities. Good symmetric motor tone is noted throughout.  Gait and station: Gait is normal.   DIAGNOSTIC DATA (LABS, IMAGING, TESTING) - I reviewed patient records, labs, notes, testing and imaging myself where available.  No flowsheet data found.   Lab Results  Component Value Date   WBC 5.0 04/07/2020   HGB 15.3 04/07/2020   HCT 46.6 (H) 04/07/2020   MCV 89.8 04/07/2020   PLT 244 04/07/2020      Component Value Date/Time   NA 142 04/07/2020 1006   K 4.1 04/07/2020 1006   CL 104 04/07/2020 1006   CO2 31 04/07/2020 1006   GLUCOSE 99 04/07/2020 1006   BUN 15 04/07/2020 1006   CREATININE 1.02 04/07/2020 1006   CALCIUM 9.6 04/07/2020 1006   PROT 6.8 04/07/2020 1006   ALBUMIN 4.2 08/02/2016 1109   AST 16 04/07/2020 1006   ALT 12 04/07/2020 1006   ALKPHOS 59 08/02/2016 1109   BILITOT 0.5 04/07/2020 1006   GFRNONAA 61 04/07/2020 1006   GFRAA 71  04/07/2020 1006   Lab Results  Component Value Date   CHOL 183 04/07/2020   HDL 60  04/07/2020   LDLCALC 109 (H) 04/07/2020   TRIG 62 04/07/2020   CHOLHDL 3.1 04/07/2020   Lab Results  Component Value Date   HGBA1C 5.6 08/06/2019   Lab Results  Component Value Date   VITAMINB12 264 04/25/2007   Lab Results  Component Value Date   TSH 0.63 03/20/2019     ASSESSMENT AND PLAN 60 y.o. year old female  has a past medical history of Arthritis, Carpal tunnel syndrome of left wrist (07/2016), Depression, History of MRSA infection (> 10 years), Hypertension, and OSA (obstructive sleep apnea) (09/05/2019). here with     ICD-10-CM   1. OSA on CPAP  G47.33 For home use only DME continuous positive airway pressure (CPAP)   Z99.89      Mikisha is doing well on CPAP therapy. Compliance report reveals optimal daily compliance and 4-hour compliance.  She was encouraged to continue using CPAP nightly and for greater than 4 hours each night.  Healthy lifestyle habits encouraged.  She will follow-up with me in 1 year, sooner if needed.  She verbalizes understanding and agreement with this plan.   Orders Placed This Encounter  Procedures  . For home use only DME continuous positive airway pressure (CPAP)    Supplies    Order Specific Question:   Length of Need    Answer:   Lifetime    Order Specific Question:   Patient has OSA or probable OSA    Answer:   Yes    Order Specific Question:   Is the patient currently using CPAP in the home    Answer:   Yes    Order Specific Question:   Settings    Answer:   Other see comments    Order Specific Question:   CPAP supplies needed    Answer:   Mask, headgear, cushions, filters, heated tubing and water chamber     No orders of the defined types were placed in this encounter.     I spent 15 minutes with the patient. 50% of this time was spent counseling and educating patient on plan of care and medications.    Shawnie Dapper, FNP-C 10/15/2020, 8:32 AM [facility] [address] [place], [place] [address] [phone]  I reviewed the above note and documentation by the Nurse Practitioner and agree with the history, exam, assessment and plan as outlined above. I was available for consultation. Huston Foley, MD, PhD [facility])

## 2020-10-14 NOTE — Patient Instructions (Addendum)
Please continue using your CPAP regularly. While your insurance requires that you use CPAP at least 4 hours each night on 70% of the nights, I recommend, that you not skip any nights and use it throughout the night if you can. Getting used to CPAP and staying with the treatment long term does take time and patience and discipline. Untreated obstructive sleep apnea when it is moderate to severe can have an adverse impact on cardiovascular health and raise her risk for heart disease, arrhythmias, hypertension, congestive heart failure, stroke and diabetes. Untreated obstructive sleep apnea causes sleep disruption, nonrestorative sleep, and sleep deprivation. This can have an impact on your day to day functioning and cause daytime sleepiness and impairment of cognitive function, memory loss, mood disturbance, and problems focussing. Using CPAP regularly can improve these symptoms.   Follow up in 1 year   Sleep Apnea Sleep apnea affects breathing during sleep. It causes breathing to stop for a short time or to become shallow. It can also increase the risk of:  Heart attack.  Stroke.  Being very overweight (obese).  Diabetes.  Heart failure.  Irregular heartbeat. The goal of treatment is to help you breathe normally again. What are the causes? There are three kinds of sleep apnea:  Obstructive sleep apnea. This is caused by a blocked or collapsed airway.  Central sleep apnea. This happens when the brain does not send the right signals to the muscles that control breathing.  Mixed sleep apnea. This is a combination of obstructive and central sleep apnea. The most common cause of this condition is a collapsed or blocked airway. This can happen if:  Your throat muscles are too relaxed.  Your tongue and tonsils are too large.  You are overweight.  Your airway is too small.   What increases the risk?  Being overweight.  Smoking.  Having a small airway.  Being older.  Being  female.  Drinking alcohol.  Taking medicines to calm yourself (sedatives or tranquilizers).  Having family members with the condition. What are the signs or symptoms?  Trouble staying asleep.  Being sleepy or tired during the day.  Getting angry a lot.  Loud snoring.  Headaches in the morning.  Not being able to focus your mind (concentrate).  Forgetting things.  Less interest in sex.  Mood swings.  Personality changes.  Feelings of sadness (depression).  Waking up a lot during the night to pee (urinate).  Dry mouth.  Sore throat. How is this diagnosed?  Your medical history.  A physical exam.  A test that is done when you are sleeping (sleep study). The test is most often done in a sleep lab but may also be done at home. How is this treated?  Sleeping on your side.  Using a medicine to get rid of mucus in your nose (decongestant).  Avoiding the use of alcohol, medicines to help you relax, or certain pain medicines (narcotics).  Losing weight, if needed.  Changing your diet.  Not smoking.  Using a machine to open your airway while you sleep, such as: ? An oral appliance. This is a mouthpiece that shifts your lower jaw forward. ? A CPAP device. This device blows air through a mask when you breathe out (exhale). ? An EPAP device. This has valves that you put in each nostril. ? A BPAP device. This device blows air through a mask when you breathe in (inhale) and breathe out.  Having surgery if other treatments do not work. It   is important to get treatment for sleep apnea. Without treatment, it can lead to:  High blood pressure.  Coronary artery disease.  In men, not being able to have an erection (impotence).  Reduced thinking ability.   Follow these instructions at home: Lifestyle  Make changes that your doctor recommends.  Eat a healthy diet.  Lose weight if needed.  Avoid alcohol, medicines to help you relax, and some pain  medicines.  Do not use any products that contain nicotine or tobacco, such as cigarettes, e-cigarettes, and chewing tobacco. If you need help quitting, ask your doctor. General instructions  Take over-the-counter and prescription medicines only as told by your doctor.  If you were given a machine to use while you sleep, use it only as told by your doctor.  If you are having surgery, make sure to tell your doctor you have sleep apnea. You may need to bring your device with you.  Keep all follow-up visits as told by your doctor. This is important. Contact a doctor if:  The machine that you were given to use during sleep bothers you or does not seem to be working.  You do not get better.  You get worse. Get help right away if:  Your chest hurts.  You have trouble breathing in enough air.  You have an uncomfortable feeling in your back, arms, or stomach.  You have trouble talking.  One side of your body feels weak.  A part of your face is hanging down. These symptoms may be an emergency. Do not wait to see if the symptoms will go away. Get medical help right away. Call your local emergency services (911 in the [place] Do not drive yourself to the hospital. Summary  This condition affects breathing during sleep.  The most common cause is a collapsed or blocked airway.  The goal of treatment is to help you breathe normally while you sleep. This information is not intended to replace advice given to you by your health care provider. Make sure you discuss any questions you have with your health care provider. Document Revised: 03/31/2018 Document Reviewed: 02/07/2018 Elsevier Patient Education  2021 [employer]  

## 2020-10-15 ENCOUNTER — Encounter: Payer: Self-pay | Admitting: Family Medicine

## 2020-10-15 ENCOUNTER — Other Ambulatory Visit: Payer: Self-pay

## 2020-10-15 ENCOUNTER — Ambulatory Visit: Payer: BC Managed Care – PPO | Admitting: Family Medicine

## 2020-10-15 VITALS — BP 129/91 | HR 77 | Ht 65.0 in | Wt 203.0 lb

## 2020-10-15 DIAGNOSIS — G4733 Obstructive sleep apnea (adult) (pediatric): Secondary | ICD-10-CM | POA: Diagnosis not present

## 2020-10-15 DIAGNOSIS — Z9989 Dependence on other enabling machines and devices: Secondary | ICD-10-CM

## 2020-10-17 DIAGNOSIS — E559 Vitamin D deficiency, unspecified: Secondary | ICD-10-CM | POA: Diagnosis not present

## 2020-10-17 DIAGNOSIS — E611 Iron deficiency: Secondary | ICD-10-CM | POA: Diagnosis not present

## 2020-10-17 DIAGNOSIS — N951 Menopausal and female climacteric states: Secondary | ICD-10-CM | POA: Diagnosis not present

## 2020-10-21 DIAGNOSIS — N951 Menopausal and female climacteric states: Secondary | ICD-10-CM | POA: Diagnosis not present

## 2020-10-21 DIAGNOSIS — R232 Flushing: Secondary | ICD-10-CM | POA: Diagnosis not present

## 2020-10-21 DIAGNOSIS — N898 Other specified noninflammatory disorders of vagina: Secondary | ICD-10-CM | POA: Diagnosis not present

## 2020-10-21 DIAGNOSIS — Z6834 Body mass index (BMI) 34.0-34.9, adult: Secondary | ICD-10-CM | POA: Diagnosis not present

## 2020-10-22 ENCOUNTER — Ambulatory Visit
Admission: RE | Admit: 2020-10-22 | Discharge: 2020-10-22 | Disposition: A | Discharge status: Home / Self Care | Payer: BC Managed Care – PPO | Source: Ambulatory Visit | Attending: Family Medicine | Admitting: Family Medicine

## 2020-10-22 ENCOUNTER — Other Ambulatory Visit: Payer: Self-pay

## 2020-10-22 DIAGNOSIS — Z1231 Encounter for screening mammogram for malignant neoplasm of breast: Secondary | ICD-10-CM | POA: Diagnosis not present

## 2020-11-03 DIAGNOSIS — E669 Obesity, unspecified: Secondary | ICD-10-CM | POA: Diagnosis not present

## 2020-11-03 DIAGNOSIS — Z6834 Body mass index (BMI) 34.0-34.9, adult: Secondary | ICD-10-CM | POA: Diagnosis not present

## 2020-11-03 DIAGNOSIS — E785 Hyperlipidemia, unspecified: Secondary | ICD-10-CM | POA: Diagnosis not present

## 2020-11-03 DIAGNOSIS — G4733 Obstructive sleep apnea (adult) (pediatric): Secondary | ICD-10-CM | POA: Diagnosis not present

## 2020-11-04 DIAGNOSIS — H25813 Combined forms of age-related cataract, bilateral: Secondary | ICD-10-CM | POA: Diagnosis not present

## 2020-11-15 ENCOUNTER — Other Ambulatory Visit: Payer: Self-pay | Admitting: Family Medicine

## 2020-11-17 DIAGNOSIS — L920 Granuloma annulare: Secondary | ICD-10-CM | POA: Diagnosis not present

## 2020-11-21 ENCOUNTER — Other Ambulatory Visit: Payer: Self-pay | Admitting: Family Medicine

## 2020-12-05 DIAGNOSIS — N951 Menopausal and female climacteric states: Secondary | ICD-10-CM | POA: Diagnosis not present

## 2020-12-05 DIAGNOSIS — E669 Obesity, unspecified: Secondary | ICD-10-CM | POA: Diagnosis not present

## 2020-12-05 DIAGNOSIS — Z713 Dietary counseling and surveillance: Secondary | ICD-10-CM | POA: Diagnosis not present

## 2020-12-05 DIAGNOSIS — R232 Flushing: Secondary | ICD-10-CM | POA: Diagnosis not present

## 2020-12-05 DIAGNOSIS — N898 Other specified noninflammatory disorders of vagina: Secondary | ICD-10-CM | POA: Diagnosis not present

## 2020-12-05 DIAGNOSIS — E785 Hyperlipidemia, unspecified: Secondary | ICD-10-CM | POA: Diagnosis not present

## 2021-01-02 DIAGNOSIS — S92515A Nondisplaced fracture of proximal phalanx of left lesser toe(s), initial encounter for closed fracture: Secondary | ICD-10-CM | POA: Diagnosis not present

## 2021-01-08 DIAGNOSIS — H2511 Age-related nuclear cataract, right eye: Secondary | ICD-10-CM | POA: Diagnosis not present

## 2021-01-23 DIAGNOSIS — S92515D Nondisplaced fracture of proximal phalanx of left lesser toe(s), subsequent encounter for fracture with routine healing: Secondary | ICD-10-CM | POA: Diagnosis not present

## 2021-01-27 DIAGNOSIS — G4733 Obstructive sleep apnea (adult) (pediatric): Secondary | ICD-10-CM | POA: Diagnosis not present

## 2021-02-02 DIAGNOSIS — E669 Obesity, unspecified: Secondary | ICD-10-CM | POA: Diagnosis not present

## 2021-02-02 DIAGNOSIS — G4733 Obstructive sleep apnea (adult) (pediatric): Secondary | ICD-10-CM | POA: Diagnosis not present

## 2021-02-02 DIAGNOSIS — Z6834 Body mass index (BMI) 34.0-34.9, adult: Secondary | ICD-10-CM | POA: Diagnosis not present

## 2021-02-02 DIAGNOSIS — I10 Essential (primary) hypertension: Secondary | ICD-10-CM | POA: Diagnosis not present

## 2021-02-03 ENCOUNTER — Other Ambulatory Visit: Payer: Self-pay | Admitting: Family Medicine

## 2021-02-03 ENCOUNTER — Encounter: Payer: Self-pay | Admitting: Family Medicine

## 2021-02-03 MED ORDER — AMLODIPINE BESYLATE 5 MG PO TABS: 5.0000 mg | ORAL_TABLET | Freq: Every day | ORAL | 3 refills | Status: DC

## 2021-02-06 ENCOUNTER — Other Ambulatory Visit: Payer: Self-pay | Admitting: *Deleted

## 2021-02-06 MED ORDER — HYDROCHLOROTHIAZIDE 25 MG PO TABS: 25.0000 mg | ORAL_TABLET | Freq: Every day | ORAL | 2 refills | Status: DC

## 2021-02-23 DIAGNOSIS — H2512 Age-related nuclear cataract, left eye: Secondary | ICD-10-CM | POA: Diagnosis not present

## 2021-02-23 DIAGNOSIS — S92515D Nondisplaced fracture of proximal phalanx of left lesser toe(s), subsequent encounter for fracture with routine healing: Secondary | ICD-10-CM | POA: Diagnosis not present

## 2021-02-26 DIAGNOSIS — H268 Other specified cataract: Secondary | ICD-10-CM | POA: Diagnosis not present

## 2021-02-26 DIAGNOSIS — H25012 Cortical age-related cataract, left eye: Secondary | ICD-10-CM | POA: Diagnosis not present

## 2021-03-06 DIAGNOSIS — M79641 Pain in right hand: Secondary | ICD-10-CM | POA: Diagnosis not present

## 2021-03-09 DIAGNOSIS — S92515D Nondisplaced fracture of proximal phalanx of left lesser toe(s), subsequent encounter for fracture with routine healing: Secondary | ICD-10-CM | POA: Diagnosis not present

## 2021-04-02 DIAGNOSIS — M7989 Other specified soft tissue disorders: Secondary | ICD-10-CM | POA: Diagnosis not present

## 2021-04-02 DIAGNOSIS — G5601 Carpal tunnel syndrome, right upper limb: Secondary | ICD-10-CM | POA: Diagnosis not present

## 2021-04-13 ENCOUNTER — Encounter: Payer: BC Managed Care – PPO | Admitting: Family Medicine

## 2021-04-20 DIAGNOSIS — R52 Pain, unspecified: Secondary | ICD-10-CM | POA: Diagnosis not present

## 2021-04-20 DIAGNOSIS — Z03818 Encounter for observation for suspected exposure to other biological agents ruled out: Secondary | ICD-10-CM | POA: Diagnosis not present

## 2021-04-20 DIAGNOSIS — Z20822 Contact with and (suspected) exposure to covid-19: Secondary | ICD-10-CM | POA: Diagnosis not present

## 2021-04-20 DIAGNOSIS — J301 Allergic rhinitis due to pollen: Secondary | ICD-10-CM | POA: Diagnosis not present

## 2021-05-08 DIAGNOSIS — N951 Menopausal and female climacteric states: Secondary | ICD-10-CM | POA: Diagnosis not present

## 2021-05-13 DIAGNOSIS — Z6834 Body mass index (BMI) 34.0-34.9, adult: Secondary | ICD-10-CM | POA: Diagnosis not present

## 2021-05-13 DIAGNOSIS — N898 Other specified noninflammatory disorders of vagina: Secondary | ICD-10-CM | POA: Diagnosis not present

## 2021-05-13 DIAGNOSIS — N951 Menopausal and female climacteric states: Secondary | ICD-10-CM | POA: Diagnosis not present

## 2021-05-13 DIAGNOSIS — R232 Flushing: Secondary | ICD-10-CM | POA: Diagnosis not present

## 2021-05-14 ENCOUNTER — Other Ambulatory Visit: Payer: Self-pay

## 2021-05-14 ENCOUNTER — Encounter: Payer: Self-pay | Admitting: Family Medicine

## 2021-05-14 ENCOUNTER — Ambulatory Visit (INDEPENDENT_AMBULATORY_CARE_PROVIDER_SITE_OTHER): Payer: BC Managed Care – PPO | Admitting: Family Medicine

## 2021-05-14 VITALS — BP 122/82 | HR 86 | Temp 97.6°F | Resp 18 | Ht 64.0 in | Wt 208.0 lb

## 2021-05-14 DIAGNOSIS — I10 Essential (primary) hypertension: Secondary | ICD-10-CM | POA: Diagnosis not present

## 2021-05-14 DIAGNOSIS — E559 Vitamin D deficiency, unspecified: Secondary | ICD-10-CM | POA: Diagnosis not present

## 2021-05-14 DIAGNOSIS — Z23 Encounter for immunization: Secondary | ICD-10-CM | POA: Diagnosis not present

## 2021-05-14 DIAGNOSIS — Z0000 Encounter for general adult medical examination without abnormal findings: Secondary | ICD-10-CM | POA: Diagnosis not present

## 2021-05-14 DIAGNOSIS — E7800 Pure hypercholesterolemia, unspecified: Secondary | ICD-10-CM

## 2021-05-14 MED ORDER — MOMETASONE FUROATE 0.1 % EX CREA: TOPICAL_CREAM | Freq: Every day | CUTANEOUS | 1 refills | Status: AC

## 2021-05-14 MED ORDER — VALACYCLOVIR HCL 1 G PO TABS: 2000.0000 mg | ORAL_TABLET | Freq: Two times a day (BID) | ORAL | 5 refills | Status: DC

## 2021-05-14 NOTE — Progress Notes (Signed)
Subjective:    Patient ID: Rhonda [name], female    DOB: [birth year], 60 y.o.   MRN: [id number]  HPI Patient is a very pleasant 60 year old African-American female here today for complete physical exam.  She is due for a flu shot.  She is also due for a booster on her COVID shot.  She has had the shingles vaccine.  Her last colonoscopy was in 2015 and is not due again until 2025.  She had a mammogram earlier this year that was normal.  She is not due for a bone density.  Although she does have a strong family history of osteoporosis as well as vitamin D deficiency herself.  Therefore I would check a bone density test 60 rather than 65.  She does not require Pap smear due to her personal history of a hysterectomy. Past Medical History:  Diagnosis Date   Arthritis    knees   Carpal tunnel syndrome of left wrist 07/2016   Depression    History of MRSA infection > 10 years   right leg   Hypertension    states under control with meds., has been on med. > 2 yr.   OSA (obstructive sleep apnea) 09/05/2019   Past Surgical History:  Procedure Laterality Date   ABDOMINAL HYSTERECTOMY     partial   BREAST EXCISIONAL BIOPSY     CARPAL TUNNEL RELEASE Left 08/26/2016   Procedure: LEFT WRIST CARPAL TUNNEL RELEASE;  Surgeon: Loreta Ave, MD;  Location: [facility];  Service: Orthopedics;  Laterality: Left;   COLONOSCOPY WITH PROPOFOL  03/20/2014   LAPAROSCOPIC UNILATERAL SALPINGECTOMY Right 05/18/2002   OVARY BIOPSY Right 05/18/2002   TRIGGER FINGER RELEASE Left 02/10/2017   Procedure: LEFT TRIGGER THUMB RELEASE (TENDON SHEATH INCISION);  Surgeon: Loreta Ave, MD;  Location: [facility];  Service: Orthopedics;  Laterality: Left;   Current Outpatient Medications on File Prior to Visit  Medication Sig Dispense Refill   amLODipine (NORVASC) 5 MG tablet Take 1 tablet (5 mg total) by mouth daily. 90 tablet 3   atenolol (TENORMIN) 25 MG tablet 1 tablet     buPROPion  (WELLBUTRIN XL) 300 MG 24 hr tablet TAKE 1 TABLET BY MOUTH EVERY DAY 90 tablet 3   Cholecalciferol (DIALYVITE VITAMIN D 5000) 125 MCG (5000 UT) capsule Take 2 capsules (10,000 Units total) by mouth daily.     desvenlafaxine (PRISTIQ) 50 MG 24 hr tablet Take 1 tablet (50 mg total) by mouth daily. Stop paxil and cymbalta 90 tablet 2   ezetimibe-simvastatin (VYTORIN) 10-10 MG tablet Take 1 tablet by mouth at bedtime.     fluticasone (FLONASE) 50 MCG/ACT nasal spray Place into both nostrils as needed.      hydrochlorothiazide (HYDRODIURIL) 25 MG tablet Take 1 tablet (25 mg total) by mouth daily. 90 tablet 2   Multiple Vitamins-Iron (DAILY VITAMIN/IRON) TABS Chelate Iron     polyethylene glycol powder (GLYCOLAX/MIRALAX) 17 GM/SCOOP powder Take 17 g by mouth 2 (two) times daily as needed. 3350 g 1   progesterone (PROMETRIUM) 100 MG capsule Take by mouth at bedtime.     valsartan (DIOVAN) 320 MG tablet Take 1 tablet (320 mg total) by mouth daily. 90 tablet 3   No current facility-administered medications on file prior to visit.   Allergies  Allergen Reactions   Penicillins Hives         Statins Other (See Comments)    MYALGIAS   Lisinopril Cough   Social History  Socioeconomic History   Marital status: Single    Spouse name: Not on file   Number of children: Not on file   Years of education: Not on file   Highest education level: Not on file  Occupational History   Not on file  Tobacco Use   Smoking status: Never   Smokeless tobacco: Never  Substance and Sexual Activity   Alcohol use: No   Drug use: No   Sexual activity: Not on file  Other Topics Concern   Not on file  Social History Narrative   Not on file   Social Determinants of Health   Financial Resource Strain: Not on file  Food Insecurity: Not on file  Transportation Needs: Not on file  Physical Activity: Not on file  Stress: Not on file  Social Connections: Not on file  Intimate Partner Violence: Not on file    Family History  Problem Relation Age of Onset   Breast cancer Maternal Aunt      Review of Systems  All other systems reviewed and are negative.     Objective:   Physical Exam Vitals reviewed.  Constitutional:      General: She is not in acute distress.    Appearance: Normal appearance. She is obese. She is not ill-appearing, toxic-appearing or diaphoretic.  HENT:     Head: Normocephalic and atraumatic.     Right Ear: Tympanic membrane, ear canal and external ear normal. There is no impacted cerumen.     Left Ear: Tympanic membrane, ear canal and external ear normal. There is no impacted cerumen.     Nose: Nose normal. No congestion or rhinorrhea.     Mouth/Throat:     Mouth: Mucous membranes are moist.     Pharynx: No oropharyngeal exudate or posterior oropharyngeal erythema.  Eyes:     General: No scleral icterus.       Right eye: No discharge.        Left eye: No discharge.     Extraocular Movements: Extraocular movements intact.     Conjunctiva/sclera: Conjunctivae normal.     Pupils: Pupils are equal, round, and reactive to light.  Neck:     Vascular: No carotid bruit.  Cardiovascular:     Rate and Rhythm: Normal rate and regular rhythm.     Pulses: Normal pulses.     Heart sounds: Normal heart sounds. No murmur heard.   No friction rub. No gallop.  Pulmonary:     Effort: Pulmonary effort is normal. No respiratory distress.     Breath sounds: Normal breath sounds. No stridor. No wheezing, rhonchi or rales.  Chest:     Chest wall: No tenderness.  Abdominal:     General: Abdomen is flat. There is no distension.     Palpations: Abdomen is soft. There is no mass.     Tenderness: There is no abdominal tenderness. There is no right CVA tenderness, left CVA tenderness, guarding or rebound.     Hernia: No hernia is present.  Musculoskeletal:        General: Normal range of motion.     Cervical back: Normal range of motion and neck supple. No rigidity or tenderness.      Right lower leg: No edema.     Left lower leg: No edema.  Lymphadenopathy:     Cervical: No cervical adenopathy.  Skin:    General: Skin is warm.     Coloration: Skin is not jaundiced or pale.  Findings: No bruising, erythema, lesion or rash.  Neurological:     General: No focal deficit present.     Mental Status: She is alert and oriented to person, place, and time. Mental status is at baseline.     Cranial Nerves: No cranial nerve deficit.     Sensory: No sensory deficit.     Motor: No weakness.     Coordination: Coordination normal.     Gait: Gait normal.     Deep Tendon Reflexes: Reflexes normal.  Psychiatric:        Mood and Affect: Mood normal.        Behavior: Behavior normal.        Thought Content: Thought content normal.        Judgment: Judgment normal.          Assessment & Plan:  Pure hypercholesterolemia - Plan: CBC with Differential/Platelet, COMPLETE METABOLIC PANEL WITH GFR, Lipid panel, VITAMIN D 25 Hydroxy (Vit-D Deficiency, Fractures)  General medical exam - Plan: CBC with Differential/Platelet, COMPLETE METABOLIC PANEL WITH GFR, Lipid panel, VITAMIN D 25 Hydroxy (Vit-D Deficiency, Fractures)  Essential hypertension, benign - Plan: CBC with Differential/Platelet, COMPLETE METABOLIC PANEL WITH GFR, Lipid panel, VITAMIN D 25 Hydroxy (Vit-D Deficiency, Fractures)  Vitamin D deficiency - Plan: VITAMIN D 25 Hydroxy (Vit-D Deficiency, Fractures) Patient's blood pressure today is excellent.  Check CBC, CMP, fasting lipid panel.  Goal LDL cholesterol is less than 100.  Check vitamin D level given her history of vitamin D deficiency.  Mammogram, colonoscopy are both up-to-date.  Pap smear is not necessary.  Check bone density test at 60.  Encourage calcium 1200 mg a day and vitamin D 1000 to 2000 units a day

## 2021-05-15 ENCOUNTER — Encounter: Payer: Self-pay | Admitting: Family Medicine

## 2021-05-15 LAB — COMPLETE METABOLIC PANEL WITH GFR
AG Ratio: 1.8 (calc) (ref 1.0–2.5)
ALT: 13 U/L (ref 6–29)
AST: 16 U/L (ref 10–35)
Albumin: 4.4 g/dL (ref 3.6–5.1)
Alkaline phosphatase (APISO): 53 U/L (ref 37–153)
BUN: 17 mg/dL (ref 7–25)
CO2: 28 mmol/L (ref 20–32)
Calcium: 9.5 mg/dL (ref 8.6–10.4)
Chloride: 100 mmol/L (ref 98–110)
Creat: 0.87 mg/dL (ref 0.50–1.03)
Globulin: 2.5 g/dL (calc) (ref 1.9–3.7)
Glucose, Bld: 87 mg/dL (ref 65–99)
Potassium: 4.1 mmol/L (ref 3.5–5.3)
Sodium: 138 mmol/L (ref 135–146)
Total Bilirubin: 0.5 mg/dL (ref 0.2–1.2)
Total Protein: 6.9 g/dL (ref 6.1–8.1)
eGFR: 77 mL/min/{1.73_m2} (ref >=60)

## 2021-05-15 LAB — CBC WITH DIFFERENTIAL/PLATELET
Absolute Monocytes: 637 cells/uL (ref 200–950)
Basophils Absolute: 30 cells/uL (ref 0–200)
Basophils Relative: 0.5 %
Eosinophils Absolute: 142 cells/uL (ref 15–500)
Eosinophils Relative: 2.4 %
HCT: 43.6 % (ref 35.0–45.0)
Hemoglobin: 14.8 g/dL (ref 11.7–15.5)
Lymphs Abs: 1923 cells/uL (ref 850–3900)
MCH: 30.6 pg (ref 27.0–33.0)
MCHC: 33.9 g/dL (ref 32.0–36.0)
MCV: 90.1 fL (ref 80.0–100.0)
MPV: 9.6 fL (ref 7.5–12.5)
Monocytes Relative: 10.8 %
Neutro Abs: 3168 cells/uL (ref 1500–7800)
Neutrophils Relative %: 53.7 %
Platelets: 259 10*3/uL (ref 140–400)
RBC: 4.84 10*6/uL (ref 3.80–5.10)
RDW: 13.3 % (ref 11.0–15.0)
Total Lymphocyte: 32.6 %
WBC: 5.9 10*3/uL (ref 3.8–10.8)

## 2021-05-15 LAB — LIPID PANEL
Cholesterol: 205 mg/dL — ABNORMAL HIGH (ref <200)
HDL: 66 mg/dL (ref >=50)
LDL Cholesterol (Calc): 123 mg/dL (calc) — ABNORMAL HIGH
Non-HDL Cholesterol (Calc): 139 mg/dL (calc) — ABNORMAL HIGH (ref <130)
Total CHOL/HDL Ratio: 3.1 (calc) (ref <5.0)
Triglycerides: 65 mg/dL (ref <150)

## 2021-05-15 LAB — VITAMIN D 25 HYDROXY (VIT D DEFICIENCY, FRACTURES): Vit D, 25-Hydroxy: 72 ng/mL (ref 30–100)

## 2021-05-19 ENCOUNTER — Telehealth: Payer: BC Managed Care – PPO | Admitting: Nurse Practitioner

## 2021-05-19 ENCOUNTER — Telehealth: Payer: BC Managed Care – PPO | Admitting: Physician Assistant

## 2021-05-19 ENCOUNTER — Other Ambulatory Visit: Payer: Self-pay

## 2021-05-19 ENCOUNTER — Other Ambulatory Visit: Payer: Self-pay | Admitting: Family Medicine

## 2021-05-19 DIAGNOSIS — J3089 Other allergic rhinitis: Secondary | ICD-10-CM

## 2021-05-19 DIAGNOSIS — U071 COVID-19: Secondary | ICD-10-CM

## 2021-05-19 MED ORDER — BENZONATATE 100 MG PO CAPS: 100.0000 mg | ORAL_CAPSULE | Freq: Three times a day (TID) | ORAL | 0 refills | Status: DC | PRN

## 2021-05-19 MED ORDER — MOLNUPIRAVIR EUA 200MG CAPSULE: 4.0000 | ORAL_CAPSULE | Freq: Two times a day (BID) | ORAL | 0 refills | Status: AC

## 2021-05-19 MED ORDER — MONTELUKAST SODIUM 10 MG PO TABS: 10.0000 mg | ORAL_TABLET | Freq: Every day | ORAL | 0 refills | Status: DC

## 2021-05-19 NOTE — Telephone Encounter (Signed)
Any additional recommendations? Which other allergy medication would you prescribe? Thanks!

## 2021-05-19 NOTE — Telephone Encounter (Signed)
Can we schedule virtual visit for today since she tested positive for COVID?

## 2021-05-19 NOTE — Patient Instructions (Signed)
Hello Ciin,  You are being placed in the home monitoring program for COVID-19 (commonly known as Coronavirus).  This is because you are suspected to have the virus or are known to have the virus.  If you are unsure which group you fall into call your clinic.    As part of this program, you'll answer a daily questionnaire in the MyChart mobile app. You'll receive a notification through the MyChart app when the questionnaire is available. When you log in to MyChart, you'll see the tasks in your To Do activity.       Clinicians will see any answers that are concerning and take appropriate steps.  If at any point you are having a medical emergency, call 911.  If otherwise concerned call your clinic instead of coming into the clinic or hospital.  To keep from spreading the disease you should: Stay home and limit contact with other people as much as possible.  Wash your hands frequently. Cover your coughs and sneezes with a tissue, and throw used tissues in the trash.   Clean and disinfect frequently touched surfaces and objects.    Take care of yourself by: Staying home Resting Drinking fluids Take fever-reducing medications (Tylenol/Acetaminophen and Ibuprofen)  For more information on the disease go to the Centers for Disease Control and Prevention website     You are being prescribed MOLNUPIRAVIR for COVID-19 infection.   Please call the pharmacy or go through the drive through vs going inside if you are picking up the mediation yourself to prevent further spread. If prescribed to a [facility] affiliated pharmacy, a pharmacist will bring the medication out to your car.   ADMINISTRATION INSTRUCTIONS: Take with or without food. Swallow the tablets whole. Don't chew, crush, or break the medications because it might not work as well  For each dose of the medication, you should be taking FOUR tablets at one time, TWICE a day   Finish your full five-day course of Molnupiravir even if you  feel better before you're done. Stopping this medication too early can make it less effective to prevent severe illness related to COVID19.    Molnupiravir is prescribed for YOU ONLY. Don't share it with others, even if they have similar symptoms as you. This medication might not be right for everyone.   Make sure to take steps to protect yourself and others while you're taking this medication in order to get well soon and to prevent others from getting sick with COVID-19.   **If you are of childbearing potential (any gender) - it is advised to not get pregnant while taking this medication and recommended that condoms are used for female partners the next 3 months after taking the medication out of extreme caution    COMMON SIDE EFFECTS: Diarrhea Nausea  Dizziness    If your COVID-19 symptoms get worse, get medical help right away. Call 911 if you experience symptoms such as worsening cough, trouble breathing, chest pain that doesn't go away, confusion, a hard time staying awake, and pale or blue-colored skin. This medication won't prevent all COVID-19 cases from getting worse.    Can take to lessen severity: Vit C 500mg  twice daily Quercertin 250-500mg  twice daily Zinc 75-100mg  daily Melatonin 3-6 mg at bedtime Vit D3 1000-2000 IU daily Aspirin 81 mg daily with food Optional: Famotidine 20mg  daily Also can add tylenol/ibuprofen as needed for fevers and body aches May add Mucinex or Mucinex DM as needed for cough/congestion  10 Things You Can  Do to Manage Your COVID-19 Symptoms at Home If you have possible or confirmed COVID-19 Stay home except to get medical care. Monitor your symptoms carefully. If your symptoms get worse, call your healthcare provider immediately. Get rest and stay hydrated. If you have a medical appointment, call the healthcare provider ahead of time and tell them that you have or may have COVID-19. For medical emergencies, call 911 and notify the dispatch  personnel that you have or may have COVID-19. Cover your cough and sneezes with a tissue or use the inside of your elbow. Wash your hands often with soap and water for at least 20 seconds or clean your hands with an alcohol-based hand sanitizer that contains at least 60% alcohol. As much as possible, stay in a specific room and away from other people in your home. Also, you should use a separate bathroom, if available. If you need to be around other people in or outside of the home, wear a mask. Avoid sharing personal items with other people in your household, like dishes, towels, and bedding. Clean all surfaces that are touched often, like counters, tabletops, and doorknobs. Use household cleaning sprays or wipes according to the label instructions. [email or web address] 01/11/2020 This information is not intended to replace advice given to you by your health care provider. Make sure you discuss any questions you have with your health care provider. Document Revised: 03/06/2021 Document Reviewed: 03/06/2021 Elsevier Patient Education  2022 [employer].

## 2021-05-19 NOTE — Progress Notes (Signed)
Virtual Visit Consent   Rhonda [name], you are scheduled for a virtual visit with a [facility] provider today.     Just as with appointments in the office, your consent must be obtained to participate.  Your consent will be active for this visit and any virtual visit you may have with one of our providers in the next 365 days.     If you have a MyChart account, a copy of this consent can be sent to you electronically.  All virtual visits are billed to your insurance company just like a traditional visit in the office.    As this is a virtual visit, video technology does not allow for your provider to perform a traditional examination.  This may limit your provider's ability to fully assess your condition.  If your provider identifies any concerns that need to be evaluated in person or the need to arrange testing (such as labs, EKG, etc.), we will make arrangements to do so.     Although advances in technology are sophisticated, we cannot ensure that it will always work on either your end or our end.  If the connection with a video visit is poor, the visit may have to be switched to a telephone visit.  With either a video or telephone visit, we are not always able to ensure that we have a secure connection.     I need to obtain your verbal consent now.   Are you willing to proceed with your visit today?    Illona Bulman has provided verbal consent on 05/19/2021 for a virtual visit (video or telephone).   Margaretann Loveless, PA-C   Date: 05/19/2021 11:11 AM   Virtual Visit via Video Note   I, Margaretann Loveless, connected with  Rhonda [name]  ([id number], 06-26-1961) on 05/19/21 at 11:00 AM EST by a video-enabled telemedicine application and verified that I am speaking with the correct person using two identifiers.  Location: Patient: Virtual Visit Location Patient: Home Provider: Virtual Visit Location Provider: Home Office   I discussed the limitations of evaluation and  management by telemedicine and the availability of in person appointments. The patient expressed understanding and agreed to proceed.    History of Present Illness: Rhonda [name] is a 60 y.o. who identifies as a female who was assigned female at birth, and is being seen today for Covid 77.  HPI: URI  This is a new problem. Episode onset: tested positive for covid 19 this morning; symptoms started yesterday. The problem has been gradually worsening. There has been no fever. Associated symptoms include congestion, coughing, headaches, rhinorrhea, sinus pain, sneezing and a sore throat (scratchy). Associated symptoms comments: Back pain. Treatments tried: coricidin hbp, xyzal. The treatment provided no relief.  She does have a son with special needs that she cares for and he was diagnosed with Covid last Friday.   Problems:  Patient Active Problem List   Diagnosis Date Noted   OSA (obstructive sleep apnea) 09/05/2019   Depression, major, single episode, moderate (HCC) 10/24/2017   Migraines 02/14/2015   Chronic constipation 02/14/2015   Hyperlipidemia 08/02/2014   Generalized OA 12/19/2013   Obesity 12/19/2013   Shoulder pain 12/14/2012   Essential hypertension, benign 12/14/2012   Vaginal atrophy 12/14/2012   Menopausal hot flushes 12/14/2012    Allergies:  Allergies  Allergen Reactions   Penicillins Hives         Statins Other (See Comments)    MYALGIAS  Lisinopril Cough   Medications:  Current Outpatient Medications:    benzonatate (TESSALON) 100 MG capsule, Take 1 capsule (100 mg total) by mouth 3 (three) times daily as needed., Disp: 30 capsule, Rfl: 0   molnupiravir EUA (LAGEVRIO) 200 mg CAPS capsule, Take 4 capsules (800 mg total) by mouth 2 (two) times daily for 5 days., Disp: 40 capsule, Rfl: 0   montelukast (SINGULAIR) 10 MG tablet, Take 1 tablet (10 mg total) by mouth at bedtime., Disp: 30 tablet, Rfl: 0   amLODipine (NORVASC) 5 MG tablet, Take 1 tablet (5 mg  total) by mouth daily., Disp: 90 tablet, Rfl: 3   atenolol (TENORMIN) 25 MG tablet, 1 tablet, Disp: , Rfl:    buPROPion (WELLBUTRIN XL) 300 MG 24 hr tablet, TAKE 1 TABLET BY MOUTH EVERY DAY, Disp: 90 tablet, Rfl: 3   Cholecalciferol (DIALYVITE VITAMIN D 5000) 125 MCG (5000 UT) capsule, Take 2 capsules (10,000 Units total) by mouth daily., Disp: , Rfl:    desvenlafaxine (PRISTIQ) 50 MG 24 hr tablet, Take 1 tablet (50 mg total) by mouth daily. Stop paxil and cymbalta, Disp: 90 tablet, Rfl: 2   ezetimibe-simvastatin (VYTORIN) 10-10 MG tablet, Take 1 tablet by mouth at bedtime., Disp: , Rfl:    fluticasone (FLONASE) 50 MCG/ACT nasal spray, Place into both nostrils as needed. , Disp: , Rfl:    hydrochlorothiazide (HYDRODIURIL) 25 MG tablet, Take 1 tablet (25 mg total) by mouth daily., Disp: 90 tablet, Rfl: 2   mometasone (ELOCON) 0.1 % cream, Apply topically daily., Disp: 15 g, Rfl: 1   Multiple Vitamins-Iron (DAILY VITAMIN/IRON) TABS, Chelate Iron, Disp: , Rfl:    polyethylene glycol powder (GLYCOLAX/MIRALAX) 17 GM/SCOOP powder, Take 17 g by mouth 2 (two) times daily as needed., Disp: 3350 g, Rfl: 1   progesterone (PROMETRIUM) 100 MG capsule, Take by mouth at bedtime., Disp: , Rfl:    valsartan (DIOVAN) 320 MG tablet, Take 1 tablet (320 mg total) by mouth daily., Disp: 90 tablet, Rfl: 3  Observations/Objective: Patient is well-developed, well-nourished in no acute distress.  Resting comfortably at home.  Head is normocephalic, atraumatic.  No labored breathing.  Speech is clear and coherent with logical content.  Patient is alert and oriented at baseline.    Assessment and Plan: 1. COVID-19 - benzonatate (TESSALON) 100 MG capsule; Take 1 capsule (100 mg total) by mouth 3 (three) times daily as needed.  Dispense: 30 capsule; Refill: 0 - molnupiravir EUA (LAGEVRIO) 200 mg CAPS capsule; Take 4 capsules (800 mg total) by mouth 2 (two) times daily for 5 days.  Dispense: 40 capsule; Refill: 0 -  MyChart COVID-19 home monitoring program; Future  2. Environmental and seasonal allergies - montelukast (SINGULAIR) 10 MG tablet; Take 1 tablet (10 mg total) by mouth at bedtime.  Dispense: 30 tablet; Refill: 0  - Continue OTC symptomatic management of choice - Will send OTC vitamins and supplement information through AVS - Molnupiravir prescribed - Tessalon perles for cough - Singulair prescribed - Patient enrolled in MyChart symptom monitoring - Push fluids - Rest as needed - Discussed return precautions and when to seek in-person evaluation, sent via AVS as well   Follow Up Instructions: I discussed the assessment and treatment plan with the patient. The patient was provided an opportunity to ask questions and all were answered. The patient agreed with the plan and demonstrated an understanding of the instructions.  A copy of instructions were sent to the patient via MyChart unless otherwise noted below.  The patient was advised to call back or seek an in-person evaluation if the symptoms worsen or if the condition fails to improve as anticipated.  Time:  I spent 13 minutes with the patient via telehealth technology discussing the above problems/concerns.    Mar Daring, PA-C

## 2021-06-12 ENCOUNTER — Other Ambulatory Visit: Payer: Self-pay | Admitting: Physician Assistant

## 2021-06-12 DIAGNOSIS — J3089 Other allergic rhinitis: Secondary | ICD-10-CM

## 2021-06-17 ENCOUNTER — Encounter: Payer: Self-pay | Admitting: Family Medicine

## 2021-06-18 ENCOUNTER — Other Ambulatory Visit: Payer: Self-pay | Admitting: Family Medicine

## 2021-06-18 DIAGNOSIS — J3089 Other allergic rhinitis: Secondary | ICD-10-CM

## 2021-06-18 MED ORDER — MONTELUKAST SODIUM 10 MG PO TABS: 10.0000 mg | ORAL_TABLET | Freq: Every day | ORAL | 3 refills | Status: DC

## 2021-06-19 MED ORDER — VALSARTAN 320 MG PO TABS: 320.0000 mg | ORAL_TABLET | Freq: Every day | ORAL | 2 refills | Status: DC

## 2021-06-19 NOTE — Addendum Note (Signed)
Addended by: Grier Rocher on: 06/19/2021 01:48 PM   Modules accepted: Orders

## 2021-07-06 DIAGNOSIS — M25511 Pain in right shoulder: Secondary | ICD-10-CM | POA: Diagnosis not present

## 2021-07-13 DIAGNOSIS — M25511 Pain in right shoulder: Secondary | ICD-10-CM | POA: Diagnosis not present

## 2021-07-15 DIAGNOSIS — G5601 Carpal tunnel syndrome, right upper limb: Secondary | ICD-10-CM | POA: Diagnosis not present

## 2021-07-22 DIAGNOSIS — G4733 Obstructive sleep apnea (adult) (pediatric): Secondary | ICD-10-CM | POA: Diagnosis not present

## 2021-08-10 DIAGNOSIS — M25511 Pain in right shoulder: Secondary | ICD-10-CM | POA: Diagnosis not present

## 2021-08-22 DIAGNOSIS — G4733 Obstructive sleep apnea (adult) (pediatric): Secondary | ICD-10-CM | POA: Diagnosis not present

## 2021-09-03 DIAGNOSIS — N951 Menopausal and female climacteric states: Secondary | ICD-10-CM | POA: Diagnosis not present

## 2021-09-10 DIAGNOSIS — N951 Menopausal and female climacteric states: Secondary | ICD-10-CM | POA: Diagnosis not present

## 2021-09-10 DIAGNOSIS — Z6835 Body mass index (BMI) 35.0-35.9, adult: Secondary | ICD-10-CM | POA: Diagnosis not present

## 2021-09-10 DIAGNOSIS — N898 Other specified noninflammatory disorders of vagina: Secondary | ICD-10-CM | POA: Diagnosis not present

## 2021-09-10 DIAGNOSIS — R232 Flushing: Secondary | ICD-10-CM | POA: Diagnosis not present

## 2021-09-16 ENCOUNTER — Telehealth: Payer: Self-pay | Admitting: Family Medicine

## 2021-09-16 NOTE — Telephone Encounter (Signed)
Received eFax from pharmacy to request Rx for  ? ?atenolol (TENORMIN) 50 MG tablet [[id number]] ? ?Pharmacy eFax received from: ? ?[id number] - [place], [place] - [address]  ?[address], [place] [address]  ?Phone:  [phone]  Fax:  [phone]  ?DEA #:  [id number] ? ?Please advise pharmacist.  ?

## 2021-09-18 ENCOUNTER — Other Ambulatory Visit: Payer: Self-pay

## 2021-09-18 NOTE — Telephone Encounter (Signed)
RX never been ordered by Dr. Franco Collet provider, Rx refuse ?

## 2021-09-18 NOTE — Telephone Encounter (Signed)
Received eFax from pharmacy to follow up on patient's request for refill of Atenolol. ? ?Pharmacy eFax received from:  ? ?[id number] - [place], [place] [address]  ?[place], [place] [address]  ?Phone:  [phone]  Fax:  [phone]  ?DEA #:  [id number] ? ? ?Please advise pharmacist: ? ? ?

## 2021-09-19 DIAGNOSIS — G4733 Obstructive sleep apnea (adult) (pediatric): Secondary | ICD-10-CM | POA: Diagnosis not present

## 2021-09-21 ENCOUNTER — Other Ambulatory Visit: Payer: Self-pay | Admitting: Orthopedic Surgery

## 2021-09-21 ENCOUNTER — Other Ambulatory Visit: Payer: Self-pay

## 2021-09-21 DIAGNOSIS — M75101 Unspecified rotator cuff tear or rupture of right shoulder, not specified as traumatic: Secondary | ICD-10-CM

## 2021-09-21 DIAGNOSIS — M25511 Pain in right shoulder: Secondary | ICD-10-CM | POA: Diagnosis not present

## 2021-09-27 ENCOUNTER — Other Ambulatory Visit: Payer: Self-pay | Admitting: Family Medicine

## 2021-09-30 MED ORDER — EZETIMIBE 10 MG PO TABS: 10.0000 mg | ORAL_TABLET | Freq: Every day | ORAL | 1 refills | Status: DC

## 2021-09-30 NOTE — Addendum Note (Signed)
Addended by: Arta Silence on: 09/30/2021 02:16 PM ? ? Modules accepted: Orders ? ?

## 2021-10-03 ENCOUNTER — Ambulatory Visit
Admission: RE | Admit: 2021-10-03 | Discharge: 2021-10-03 | Disposition: A | Discharge status: Home / Self Care | Payer: BC Managed Care – PPO | Source: Ambulatory Visit | Attending: Orthopedic Surgery | Admitting: Orthopedic Surgery

## 2021-10-03 DIAGNOSIS — M75101 Unspecified rotator cuff tear or rupture of right shoulder, not specified as traumatic: Secondary | ICD-10-CM

## 2021-10-03 DIAGNOSIS — M25511 Pain in right shoulder: Secondary | ICD-10-CM | POA: Diagnosis not present

## 2021-10-12 DIAGNOSIS — M25511 Pain in right shoulder: Secondary | ICD-10-CM | POA: Diagnosis not present

## 2021-10-14 NOTE — Progress Notes (Signed)
? ? ?PATIENT: Rhonda [name] ?DOB: 08/02/1960 ? ?REASON FOR VISIT: follow up ?HISTORY FROM: patient ? ?Chief Complaint  ?Patient presents with  ? Obstructive Sleep Apnea  ?  Rm 2, alone. Here fore CPAP f/u. Pt has not used CPAP since Jan. Pt reports having COVID in Aug/Sep. Pt reports to much pressure and making her feel smothered. Allergies also make it difficult to use. She would like to be able to use CPAP but is unable to due to these issues.   ?  ? ?HISTORY OF PRESENT ILLNESS: ? ?10/15/21 ALL:  ?Rhonda [name] returns for follow up for OSA on CPAP. She was last seen 09/2020 and doing well. She reports being diagnosed with Covid in 03/2021. Since, she has had difficulty using CPAP. She feels that she is smothering. She felt that the pressure was too strong before she could go to sleep. She knows that she needs CPAP. She is much more tired than normal. She understands need for therapy and wishes to resume.  ? ? ? ?10/15/2020 ALL:  ?She returns for follow up for OSA on CPAP. She continues to do very well on therapy. She is sleeping more soundly and feels better rested. She is not having to take naps during the day. She is caring for her child with special needs and recently started caring for an adult friend with special needs. She admits that sometimes she falls asleep in her chair reading and forgets to put CPAP mask on. Otherwise, doing very well.  ? ? ? ? ?10/15/2019 ALL:  ?Rhonda [name] is a 61 y.o. female here today for follow up for OSA on CPAP. She is doing well. She has changed her mask and now using nasal pillow. She feels this is a much better fit for her. She feels much better on CPAP therapy.  ? ?Compliance report dated 09/11/2019 through 10/10/2019 reveals that she used CPAP 30 of the past 30 days for compliance of 100%.  She used CPAP greater than 4 hours 27 of the past 30 days for compliance of 90%.  Average usage was 5 hours minutes.  Residual AHI was 4.9 on 6 to 12 cm of water and an EPR of 3.  Leak  has significantly improved and 95th percentile now 12.9. ? ?HISTORY: (copied from  note on 07/16/2019) ? ?Rhonda [name] is a 61 y.o. female here today for follow up. HST revealed severe OSA with AHI 52.3 and O2 nadir of 88%. She returns today for her initial CPAP compliance review.  She has noted significant improvement in sleep quality since starting CPAP therapy.  She is using CPAP nightly.  Her only concern is that she does note a leak around the nasal area of her mask.  She is using a DreamWear full facemask.  She is concerned that it may be the wrong size.  Otherwise she is doing very well ?  ?Compliance report dated 06/12/2019 through 07/11/2019 reveals that she used CPAP 30 out of the last 30 days for compliance of 100%.  She is CPAP greater than 4 hours all 30 days for compliance of 100%.  Average usage was 6 hours and 17 minutes.  Residual AHI was 6.2 on 6 to 12 cm of water and an EPR of 3.  Pressure in the 95th percentile of 11.8 with maximum 11.9.  A leak was noted in the 95th percentile of 22.6. ? ?  ?HISTORY: (copied from Dr Teofilo PodAthar's note on 04/05/2019) ?  ?Dear Dr. Jeanice [name], ?I  saw your patient, Rhonda [name], upon your kind request to my sleep clinic today for initial consultation of her sleep disorder, in particular, concern for underlying obstructive sleep apnea.  The patient is unaccompanied today.  As you know, his Chopra is a 61 year old right-handed woman with an underlying medical history of arthritis, history of carpal tunnel syndrome with status post surgery on the left, depression, hypertension, and obesity, who reports snoring and excessive daytime somnolence, nonrestorative sleep, morning headaches and witnessed apneas.  The patient reports that she has been told by her daughter that she stops breathing and that snoring has been loud as reported by her daughter and also by a cousin.  She does not wake up rested.  She has lack of energy.  She has an Epworth sleepiness score of 8 out of 24,  fatigue severity score is 49 out of 63.  She tries to nap on the weekend when she can catch up on sleep and may sleep for 3 to 4 hours during what started as a nap.  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  She has an uncle with sleep apnea.  She has had trouble losing weight despite trying.  She lives with her 35 year old mother who stays with her during the week, particularly to help out with patient's 67 year old oldest son who has special needs.  She has a set of twins, age 7, a son and a daughter, she has 81 67-year-old grandson.  She has no night to night nocturia.  She has woken up with the occasional headache, different from her migraines in the past.  Her morning headaches are more dull and achy and pressure-like.  She drinks caffeine in the form of coffee, typically 2 cups/day.  She has had some food allergies but has not been formally tested.  Sometimes when she eats tomatoes for example her tongue swells and she takes Benadryl.  She is reminded to get allergy testing done for this.  Her bedtime is generally between 930 and 1030.  She falls asleep quickly but does not stay asleep well.  She has multiple nighttime awakenings.  Her rise time is around 530.  She has to be at work around 830. ? ? ?REVIEW OF SYSTEMS: Out of a complete 14 system review of symptoms, the patient complains only of the following symptoms, fatigue and all other reviewed systems are negative. ? ?ESS: 8 ? ?ALLERGIES: ?Allergies  ?Allergen Reactions  ? Penicillins Hives  ?   ? ?  ? Statins Other (See Comments)  ?  MYALGIAS  ? Lisinopril Cough  ? ? ?HOME MEDICATIONS: ?Outpatient Medications Prior to Visit  ?Medication Sig Dispense Refill  ? amLODipine (NORVASC) 5 MG tablet Take 1 tablet (5 mg total) by mouth daily. 90 tablet 3  ? atenolol (TENORMIN) 25 MG tablet 1 tablet    ? benzonatate (TESSALON) 100 MG capsule Take 1 capsule (100 mg total) by mouth 3 (three)  times daily as needed. 30 capsule 0  ? buPROPion (WELLBUTRIN XL) 300 MG 24 hr tablet TAKE 1 TABLET BY MOUTH EVERY DAY 90 tablet 3  ? Cholecalciferol (DIALYVITE VITAMIN D 5000) 125 MCG (5000 UT) capsule Take 2 capsules (10,000 Units total) by mouth daily.    ? desvenlafaxine (PRISTIQ) 50 MG 24 hr tablet TAKE 1 TABLET (50 MG TOTAL) BY MOUTH DAILY. STOP PAXIL AND CYMBALTA 90 tablet 2  ? ezetimibe (ZETIA) 10 MG tablet Take 1 tablet (  10 mg total) by mouth daily. 90 tablet 1  ? fluticasone (FLONASE) 50 MCG/ACT nasal spray Place into both nostrils as needed.     ? hydrochlorothiazide (HYDRODIURIL) 25 MG tablet Take 1 tablet (25 mg total) by mouth daily. 90 tablet 2  ? mometasone (ELOCON) 0.1 % cream Apply topically daily. 15 g 1  ? montelukast (SINGULAIR) 10 MG tablet Take 1 tablet (10 mg total) by mouth at bedtime. 90 tablet 3  ? Multiple Vitamins-Iron (DAILY VITAMIN/IRON) TABS Chelate Iron    ? polyethylene glycol powder (GLYCOLAX/MIRALAX) 17 GM/SCOOP powder Take 17 g by mouth 2 (two) times daily as needed. 3350 g 1  ? progesterone (PROMETRIUM) 100 MG capsule Take by mouth at bedtime.    ? valsartan (DIOVAN) 320 MG tablet Take 1 tablet (320 mg total) by mouth daily. 90 tablet 2  ? ?No facility-administered medications prior to visit.  ? ? ?PAST MEDICAL HISTORY: ?Past Medical History:  ?Diagnosis Date  ? Arthritis   ? knees  ? Carpal tunnel syndrome of left wrist 07/2016  ? Depression   ? History of MRSA infection > 10 years  ? right leg  ? Hypertension   ? states under control with meds., has been on med. > 2 yr.  ? OSA (obstructive sleep apnea) 09/05/2019  ? ? ?PAST SURGICAL HISTORY: ?Past Surgical History:  ?Procedure Laterality Date  ? ABDOMINAL HYSTERECTOMY    ? partial  ? BREAST EXCISIONAL BIOPSY    ? CARPAL TUNNEL RELEASE Left 08/26/2016  ? Procedure: LEFT WRIST CARPAL TUNNEL RELEASE;  Surgeon: Loreta Ave, MD;  Location: [facility];  Service: Orthopedics;  Laterality: Left;  ? COLONOSCOPY WITH  PROPOFOL  03/20/2014  ? LAPAROSCOPIC UNILATERAL SALPINGECTOMY Right 05/18/2002  ? OVARY BIOPSY Right 05/18/2002  ? TRIGGER FINGER RELEASE Left 02/10/2017  ? Procedure: LEFT TRIGGER THUMB RELEASE (TENDON SHEATH INCISIO

## 2021-10-14 NOTE — Patient Instructions (Addendum)
Please continue using your CPAP regularly. While your insurance requires that you use CPAP at least 4 hours each night on 70% of the nights, I recommend, that you not skip any nights and use it throughout the night if you can. Getting used to CPAP and staying with the treatment long term does take time and patience and discipline. Untreated obstructive sleep apnea when it is moderate to severe can have an adverse impact on cardiovascular health and raise her risk for heart disease, arrhythmias, hypertension, congestive heart failure, stroke and diabetes. Untreated obstructive sleep apnea causes sleep disruption, nonrestorative sleep, and sleep deprivation. This can have an impact on your day to day functioning and cause daytime sleepiness and impairment of cognitive function, memory loss, mood disturbance, and problems focussing. Using CPAP regularly can improve these symptoms. ? ?Focus on small goals. Try to use CPAP every day even if for a few minutes. You can use while awake to get adjusted if needed. Try melatonin 5mg  every night (can increase to 10 if needed)  ? ?Follow up in 6 months  ?

## 2021-10-15 ENCOUNTER — Encounter: Payer: Self-pay | Admitting: Family Medicine

## 2021-10-15 ENCOUNTER — Ambulatory Visit: Payer: BC Managed Care – PPO | Admitting: Family Medicine

## 2021-10-15 VITALS — BP 146/94 | HR 85 | Ht 64.0 in | Wt 212.0 lb

## 2021-10-15 DIAGNOSIS — Z9989 Dependence on other enabling machines and devices: Secondary | ICD-10-CM

## 2021-10-15 DIAGNOSIS — G4733 Obstructive sleep apnea (adult) (pediatric): Secondary | ICD-10-CM

## 2021-10-15 NOTE — Progress Notes (Signed)
CM sent to [facility] for new order ?

## 2021-10-28 ENCOUNTER — Other Ambulatory Visit: Payer: Self-pay | Admitting: Family Medicine

## 2021-10-28 NOTE — Telephone Encounter (Signed)
Call ti patient- scheduled for 6 month follow up- RF granted ?Requested Prescriptions  ?Pending Prescriptions Disp Refills  ?? hydrochlorothiazide (HYDRODIURIL) 25 MG tablet [Pharmacy Med Name: HYDROCHLOROTHIAZIDE 25 MG TAB] 90 tablet 0  ?  Sig: TAKE 1 TABLET (25 MG TOTAL) BY MOUTH DAILY.  ?  ? Cardiovascular: Diuretics - Thiazide Failed - 10/28/2021 12:13 AM  ?  ?  Failed - Last BP in normal range  ?  BP Readings from Last 1 Encounters:  ?10/15/21 (!) 146/94  ?   ?  ?  Failed - Valid encounter within last 6 months  ?  Recent Outpatient Visits   ?      ? 5 months ago Pure hypercholesterolemia  ? [facility] Pickard, Priscille Heidelberg, MD  ? 1 year ago Essential hypertension, benign  ? [facility] [place], Priscille Heidelberg, MD  ? 1 year ago General medical exam  ? [facility] Donita Brooks, MD  ? 2 years ago Essential hypertension, benign  ? [facility] [place], Velna Hatchet, MD  ? 2 years ago Depression, major, single episode, moderate (HCC)  ? [facility] [place], Velna Hatchet, MD  ?  ?  ?Future Appointments   ?        ? In 2 days Pickard, Priscille Heidelberg, MD [facility]  ?  ? ?  ?  ?  Passed - Cr in normal range and within 180 days  ?  Creat  ?Date Value Ref Range Status  ?05/14/2021 0.87 0.50 - 1.03 mg/dL Final  ?   ?  ?  Passed - K in normal range and within 180 days  ?  Potassium  ?Date Value Ref Range Status  ?05/14/2021 4.1 3.5 - 5.3 mmol/L Final  ?   ?  ?  Passed - Na in normal range and within 180 days  ?  Sodium  ?Date Value Ref Range Status  ?05/14/2021 138 135 - 146 mmol/L Final  ?   ?  ?  ? ? ?

## 2021-10-30 ENCOUNTER — Encounter: Payer: Self-pay | Admitting: Family Medicine

## 2021-10-30 ENCOUNTER — Ambulatory Visit (INDEPENDENT_AMBULATORY_CARE_PROVIDER_SITE_OTHER): Payer: BC Managed Care – PPO | Admitting: Family Medicine

## 2021-10-30 VITALS — BP 118/90 | HR 87 | Temp 98.4°F | Ht 64.0 in | Wt 209.2 lb

## 2021-10-30 DIAGNOSIS — Z6836 Body mass index (BMI) 36.0-36.9, adult: Secondary | ICD-10-CM

## 2021-10-30 DIAGNOSIS — R5383 Other fatigue: Secondary | ICD-10-CM | POA: Diagnosis not present

## 2021-10-30 DIAGNOSIS — I10 Essential (primary) hypertension: Secondary | ICD-10-CM

## 2021-10-30 DIAGNOSIS — E7800 Pure hypercholesterolemia, unspecified: Secondary | ICD-10-CM | POA: Diagnosis not present

## 2021-10-30 MED ORDER — WEGOVY 0.5 MG/0.5ML ~~LOC~~ SOAJ
0.5000 mg | SUBCUTANEOUS | 1 refills | Status: DC
Start: 2021-10-30 — End: 2022-05-28

## 2021-10-30 NOTE — Progress Notes (Signed)
? ?Subjective:  ? ? Patient ID: Rhonda [name], female    DOB: 06/21/1961, 61 y.o.   MRN: [id number] ? ?HPI ? ?Patient is a very pleasant 61 year old female here today for follow-up of her hypertension.  Her diastolic blood pressure has been running greater than 90.  She has not been taking the atenolol due to fatigue.  She is tolerating amlodipine, valsartan, and hydrochlorothiazide.  She states that she is still extremely tired.  She is compliant with her CPAP and she just recently saw her sleep specialist and the CPAP is working properly.  She states she simply just does not have any energy.  She feels like she could fall asleep all the time.  She also wants assistance with weight loss.  She has tried Ozempic in the past and did have some nausea with it but she is willing to try again ?Past Medical History:  ?Diagnosis Date  ? Arthritis   ? knees  ? Carpal tunnel syndrome of left wrist 07/2016  ? Depression   ? History of MRSA infection > 10 years  ? right leg  ? Hypertension   ? states under control with meds., has been on med. > 2 yr.  ? OSA (obstructive sleep apnea) 09/05/2019  ? ?Past Surgical History:  ?Procedure Laterality Date  ? ABDOMINAL HYSTERECTOMY    ? partial  ? BREAST EXCISIONAL BIOPSY    ? CARPAL TUNNEL RELEASE Left 08/26/2016  ? Procedure: LEFT WRIST CARPAL TUNNEL RELEASE;  Surgeon: Loreta Aveaniel F Murphy, MD;  Location: [facility];  Service: Orthopedics;  Laterality: Left;  ? COLONOSCOPY WITH PROPOFOL  03/20/2014  ? LAPAROSCOPIC UNILATERAL SALPINGECTOMY Right 05/18/2002  ? OVARY BIOPSY Right 05/18/2002  ? TRIGGER FINGER RELEASE Left 02/10/2017  ? Procedure: LEFT TRIGGER THUMB RELEASE (TENDON SHEATH INCISION);  Surgeon: Loreta AveMurphy, Daniel F, MD;  Location: [facility];  Service: Orthopedics;  Laterality: Left;  ? ?Current Outpatient Medications on File Prior to Visit  ?Medication Sig Dispense Refill  ? amLODipine (NORVASC) 5 MG tablet Take 1 tablet (5 mg total) by mouth daily. 90  tablet 3  ? atenolol (TENORMIN) 25 MG tablet 1 tablet    ? benzonatate (TESSALON) 100 MG capsule Take 1 capsule (100 mg total) by mouth 3 (three) times daily as needed. 30 capsule 0  ? buPROPion (WELLBUTRIN XL) 300 MG 24 hr tablet TAKE 1 TABLET BY MOUTH EVERY DAY 90 tablet 3  ? Cholecalciferol (DIALYVITE VITAMIN D 5000) 125 MCG (5000 UT) capsule Take 2 capsules (10,000 Units total) by mouth daily.    ? desvenlafaxine (PRISTIQ) 50 MG 24 hr tablet TAKE 1 TABLET (50 MG TOTAL) BY MOUTH DAILY. STOP PAXIL AND CYMBALTA 90 tablet 2  ? ezetimibe (ZETIA) 10 MG tablet Take 1 tablet (10 mg total) by mouth daily. 90 tablet 1  ? fluticasone (FLONASE) 50 MCG/ACT nasal spray Place into both nostrils as needed.     ? hydrochlorothiazide (HYDRODIURIL) 25 MG tablet TAKE 1 TABLET (25 MG TOTAL) BY MOUTH DAILY. 90 tablet 0  ? mometasone (ELOCON) 0.1 % cream Apply topically daily. 15 g 1  ? montelukast (SINGULAIR) 10 MG tablet Take 1 tablet (10 mg total) by mouth at bedtime. 90 tablet 3  ? Multiple Vitamins-Iron (DAILY VITAMIN/IRON) TABS Chelate Iron    ? polyethylene glycol powder (GLYCOLAX/MIRALAX) 17 GM/SCOOP powder Take 17 g by mouth 2 (two) times daily as needed. 3350 g 1  ? progesterone (PROMETRIUM) 100 MG capsule Take by mouth at bedtime.    ?  valsartan (DIOVAN) 320 MG tablet Take 1 tablet (320 mg total) by mouth daily. 90 tablet 2  ? ?No current facility-administered medications on file prior to visit.  ? ?Allergies  ?Allergen Reactions  ? Penicillins Hives  ?   ? ?  ? Statins Other (See Comments)  ?  MYALGIAS  ? Lisinopril Cough  ? ?Social History  ? ?Socioeconomic History  ? Marital status: Single  ?  Spouse name: Not on file  ? Number of children: Not on file  ? Years of education: Not on file  ? Highest education level: Not on file  ?Occupational History  ? Not on file  ?Tobacco Use  ? Smoking status: Never  ? Smokeless tobacco: Never  ?Substance and Sexual Activity  ? Alcohol use: No  ? Drug use: No  ? Sexual activity: Not on  file  ?Other Topics Concern  ? Not on file  ?Social History Narrative  ? Not on file  ? ?Social Determinants of Health  ? ?Financial Resource Strain: Not on file  ?Food Insecurity: Not on file  ?Transportation Needs: Not on file  ?Physical Activity: Not on file  ?Stress: Not on file  ?Social Connections: Not on file  ?Intimate Partner Violence: Not on file  ? ?Family History  ?Problem Relation Age of Onset  ? Breast cancer Maternal Aunt   ? ? ? ?Review of Systems  ?All other systems reviewed and are negative. ? ?   ?Objective:  ? Physical Exam ?Vitals reviewed.  ?Constitutional:   ?   General: She is not in acute distress. ?   Appearance: Normal appearance. She is obese. She is not ill-appearing, toxic-appearing or diaphoretic.  ?HENT:  ?   Head: Normocephalic and atraumatic.  ?   Right Ear: Tympanic membrane, ear canal and external ear normal. There is no impacted cerumen.  ?   Left Ear: Tympanic membrane, ear canal and external ear normal. There is no impacted cerumen.  ?   Nose: Nose normal. No congestion or rhinorrhea.  ?   Mouth/Throat:  ?   Mouth: Mucous membranes are moist.  ?   Pharynx: No oropharyngeal exudate or posterior oropharyngeal erythema.  ?Eyes:  ?   General: No scleral icterus.    ?   Right eye: No discharge.     ?   Left eye: No discharge.  ?   Extraocular Movements: Extraocular movements intact.  ?   Conjunctiva/sclera: Conjunctivae normal.  ?   Pupils: Pupils are equal, round, and reactive to light.  ?Neck:  ?   Vascular: No carotid bruit.  ?Cardiovascular:  ?   Rate and Rhythm: Normal rate and regular rhythm.  ?   Pulses: Normal pulses.  ?   Heart sounds: Normal heart sounds. No murmur heard. ?  No friction rub. No gallop.  ?Pulmonary:  ?   Effort: Pulmonary effort is normal. No respiratory distress.  ?   Breath sounds: Normal breath sounds. No stridor. No wheezing, rhonchi or rales.  ?Chest:  ?   Chest wall: No tenderness.  ?Abdominal:  ?   General: Abdomen is flat. There is no distension.  ?    Palpations: Abdomen is soft. There is no mass.  ?   Tenderness: There is no abdominal tenderness. There is no right CVA tenderness, left CVA tenderness, guarding or rebound.  ?   Hernia: No hernia is present.  ?Musculoskeletal:     ?   General: Normal range of motion.  ?   Cervical  back: Normal range of motion and neck supple. No rigidity or tenderness.  ?   Right lower leg: No edema.  ?   Left lower leg: No edema.  ?Lymphadenopathy:  ?   Cervical: No cervical adenopathy.  ?Skin: ?   General: Skin is warm.  ?   Coloration: Skin is not jaundiced or pale.  ?   Findings: No bruising, erythema, lesion or rash.  ?Neurological:  ?   General: No focal deficit present.  ?   Mental Status: She is alert and oriented to person, place, and time. Mental status is at baseline.  ?   Cranial Nerves: No cranial nerve deficit.  ?   Sensory: No sensory deficit.  ?   Motor: No weakness.  ?   Coordination: Coordination normal.  ?   Gait: Gait normal.  ?   Deep Tendon Reflexes: Reflexes normal.  ?Psychiatric:     ?   Mood and Affect: Mood normal.     ?   Behavior: Behavior normal.     ?   Thought Content: Thought content normal.     ?   Judgment: Judgment normal.  ? ? ? ? ? ?   ?Assessment & Plan:  ?Pure hypercholesterolemia - Plan: Lipid panel, COMPLETE METABOLIC PANEL WITH GFR, CBC with Differential/Platelet ? ?Essential hypertension, benign - Plan: Lipid panel, COMPLETE METABOLIC PANEL WITH GFR, CBC with Differential/Platelet ? ?Fatigue, unspecified type - Plan: TSH ? ?Body mass index (BMI) of 36.0 to 36.9 ?Due to the elevated body mass index, hypertension, and hyperlipidemia we will try Wegovy 0.5 mg subcu weekly and uptitrate on a monthly basis as high as the patient can tolerate.  She will continue to work on diet and exercise to achieve weight loss.  Due to the fatigue I will also check a TSH.  Check CBC CMP and a lipid panel.  Goal LDL cholesterol is less than 100.  I recommended increasing amlodipine to 10 mg a day and  rechecking blood pressure in 1 week ?

## 2021-10-31 LAB — LIPID PANEL
Cholesterol: 228 mg/dL — ABNORMAL HIGH (ref <200)
HDL: 72 mg/dL (ref >=50)
LDL Cholesterol (Calc): 142 mg/dL (calc) — ABNORMAL HIGH
Non-HDL Cholesterol (Calc): 156 mg/dL (calc) — ABNORMAL HIGH (ref <130)
Total CHOL/HDL Ratio: 3.2 (calc) (ref <5.0)
Triglycerides: 57 mg/dL (ref <150)

## 2021-10-31 LAB — CBC WITH DIFFERENTIAL/PLATELET
Absolute Monocytes: 657 cells/uL (ref 200–950)
Basophils Absolute: 34 cells/uL (ref 0–200)
Basophils Relative: 0.5 %
Eosinophils Absolute: 80 cells/uL (ref 15–500)
Eosinophils Relative: 1.2 %
HCT: 45.9 % — ABNORMAL HIGH (ref 35.0–45.0)
Hemoglobin: 15.8 g/dL — ABNORMAL HIGH (ref 11.7–15.5)
Lymphs Abs: 1943 cells/uL (ref 850–3900)
MCH: 31.9 pg (ref 27.0–33.0)
MCHC: 34.4 g/dL (ref 32.0–36.0)
MCV: 92.5 fL (ref 80.0–100.0)
MPV: 9.7 fL (ref 7.5–12.5)
Monocytes Relative: 9.8 %
Neutro Abs: 3987 cells/uL (ref 1500–7800)
Neutrophils Relative %: 59.5 %
Platelets: 263 10*3/uL (ref 140–400)
RBC: 4.96 10*6/uL (ref 3.80–5.10)
RDW: 13.6 % (ref 11.0–15.0)
Total Lymphocyte: 29 %
WBC: 6.7 10*3/uL (ref 3.8–10.8)

## 2021-10-31 LAB — COMPLETE METABOLIC PANEL WITH GFR
AG Ratio: 1.8 (calc) (ref 1.0–2.5)
ALT: 13 U/L (ref 6–29)
AST: 15 U/L (ref 10–35)
Albumin: 4.4 g/dL (ref 3.6–5.1)
Alkaline phosphatase (APISO): 53 U/L (ref 37–153)
BUN/Creatinine Ratio: 13 (calc) (ref 6–22)
BUN: 14 mg/dL (ref 7–25)
CO2: 26 mmol/L (ref 20–32)
Calcium: 9.5 mg/dL (ref 8.6–10.4)
Chloride: 102 mmol/L (ref 98–110)
Creat: 1.07 mg/dL — ABNORMAL HIGH (ref 0.50–1.03)
Globulin: 2.5 g/dL (calc) (ref 1.9–3.7)
Glucose, Bld: 91 mg/dL (ref 65–99)
Potassium: 3.8 mmol/L (ref 3.5–5.3)
Sodium: 141 mmol/L (ref 135–146)
Total Bilirubin: 0.5 mg/dL (ref 0.2–1.2)
Total Protein: 6.9 g/dL (ref 6.1–8.1)
eGFR: 60 mL/min/{1.73_m2} (ref >=60)

## 2021-10-31 LAB — TSH: TSH: 0.82 mIU/L (ref 0.40–4.50)

## 2021-11-02 ENCOUNTER — Encounter: Payer: Self-pay | Admitting: Family Medicine

## 2021-11-12 ENCOUNTER — Encounter: Payer: Self-pay | Admitting: Family Medicine

## 2021-11-12 NOTE — Telephone Encounter (Signed)
Please advise on increased Amlodipine and lightheaded feeling. Thanks!

## 2021-11-14 ENCOUNTER — Other Ambulatory Visit: Payer: Self-pay | Admitting: Family Medicine

## 2021-11-16 NOTE — Telephone Encounter (Signed)
Requested Prescriptions  Pending Prescriptions Disp Refills  . buPROPion (WELLBUTRIN XL) 300 MG 24 hr tablet [Pharmacy Med Name: BUPROPION HCL XL 300 MG TABLET] 90 tablet 3    Sig: TAKE 1 TABLET BY MOUTH EVERY DAY     Psychiatry: Antidepressants - bupropion Failed - 11/14/2021 12:14 AM      Failed - Cr in normal range and within 360 days    Creat  Date Value Ref Range Status  10/30/2021 1.07 (H) 0.50 - 1.03 mg/dL Final         Failed - Last BP in normal range    BP Readings from Last 1 Encounters:  10/30/21 118/90         Passed - AST in normal range and within 360 days    AST  Date Value Ref Range Status  10/30/2021 15 10 - 35 U/L Final         Passed - ALT in normal range and within 360 days    ALT  Date Value Ref Range Status  10/30/2021 13 6 - 29 U/L Final         Passed - Completed PHQ-2 or PHQ-9 in the last 360 days      Passed - Valid encounter within last 6 months    Recent Outpatient Visits          2 weeks ago Pure hypercholesterolemia   [facility] Pickard, Priscille Heidelberg, MD   6 months ago Pure hypercholesterolemia   [facility] Pickard, Priscille Heidelberg, MD   1 year ago Essential hypertension, benign   [facility] Pickard, Priscille Heidelberg, MD   1 year ago General medical exam   [facility] Donita Brooks, MD   2 years ago Essential hypertension, benign   [facility] [place], Velna Hatchet, MD

## 2021-12-09 DIAGNOSIS — N951 Menopausal and female climacteric states: Secondary | ICD-10-CM | POA: Diagnosis not present

## 2021-12-10 DIAGNOSIS — Z1159 Encounter for screening for other viral diseases: Secondary | ICD-10-CM | POA: Diagnosis not present

## 2021-12-10 DIAGNOSIS — N898 Other specified noninflammatory disorders of vagina: Secondary | ICD-10-CM | POA: Diagnosis not present

## 2021-12-10 DIAGNOSIS — N760 Acute vaginitis: Secondary | ICD-10-CM | POA: Diagnosis not present

## 2021-12-10 DIAGNOSIS — I10 Essential (primary) hypertension: Secondary | ICD-10-CM | POA: Diagnosis not present

## 2021-12-10 DIAGNOSIS — B9689 Other specified bacterial agents as the cause of diseases classified elsewhere: Secondary | ICD-10-CM | POA: Diagnosis not present

## 2021-12-17 DIAGNOSIS — G8918 Other acute postprocedural pain: Secondary | ICD-10-CM | POA: Diagnosis not present

## 2021-12-17 DIAGNOSIS — Y999 Unspecified external cause status: Secondary | ICD-10-CM | POA: Diagnosis not present

## 2021-12-17 DIAGNOSIS — S46011A Strain of muscle(s) and tendon(s) of the rotator cuff of right shoulder, initial encounter: Secondary | ICD-10-CM | POA: Diagnosis not present

## 2021-12-17 DIAGNOSIS — M7521 Bicipital tendinitis, right shoulder: Secondary | ICD-10-CM | POA: Diagnosis not present

## 2021-12-17 DIAGNOSIS — M19011 Primary osteoarthritis, right shoulder: Secondary | ICD-10-CM | POA: Diagnosis not present

## 2021-12-17 DIAGNOSIS — M7551 Bursitis of right shoulder: Secondary | ICD-10-CM | POA: Diagnosis not present

## 2021-12-17 DIAGNOSIS — X58XXXA Exposure to other specified factors, initial encounter: Secondary | ICD-10-CM | POA: Diagnosis not present

## 2021-12-17 DIAGNOSIS — M7541 Impingement syndrome of right shoulder: Secondary | ICD-10-CM | POA: Diagnosis not present

## 2021-12-17 DIAGNOSIS — S43431A Superior glenoid labrum lesion of right shoulder, initial encounter: Secondary | ICD-10-CM | POA: Diagnosis not present

## 2021-12-17 DIAGNOSIS — S46811A Strain of other muscles, fascia and tendons at shoulder and upper arm level, right arm, initial encounter: Secondary | ICD-10-CM | POA: Diagnosis not present

## 2021-12-17 DIAGNOSIS — M24111 Other articular cartilage disorders, right shoulder: Secondary | ICD-10-CM | POA: Diagnosis not present

## 2021-12-28 DIAGNOSIS — M19011 Primary osteoarthritis, right shoulder: Secondary | ICD-10-CM | POA: Diagnosis not present

## 2022-01-04 DIAGNOSIS — M7551 Bursitis of right shoulder: Secondary | ICD-10-CM | POA: Diagnosis not present

## 2022-01-04 DIAGNOSIS — M75121 Complete rotator cuff tear or rupture of right shoulder, not specified as traumatic: Secondary | ICD-10-CM | POA: Diagnosis not present

## 2022-01-04 DIAGNOSIS — M7521 Bicipital tendinitis, right shoulder: Secondary | ICD-10-CM | POA: Diagnosis not present

## 2022-01-12 DIAGNOSIS — M7521 Bicipital tendinitis, right shoulder: Secondary | ICD-10-CM | POA: Diagnosis not present

## 2022-01-12 DIAGNOSIS — M7551 Bursitis of right shoulder: Secondary | ICD-10-CM | POA: Diagnosis not present

## 2022-01-12 DIAGNOSIS — M75121 Complete rotator cuff tear or rupture of right shoulder, not specified as traumatic: Secondary | ICD-10-CM | POA: Diagnosis not present

## 2022-01-12 DIAGNOSIS — S46011A Strain of muscle(s) and tendon(s) of the rotator cuff of right shoulder, initial encounter: Secondary | ICD-10-CM | POA: Diagnosis not present

## 2022-01-13 DIAGNOSIS — N898 Other specified noninflammatory disorders of vagina: Secondary | ICD-10-CM | POA: Diagnosis not present

## 2022-01-13 DIAGNOSIS — R232 Flushing: Secondary | ICD-10-CM | POA: Diagnosis not present

## 2022-01-13 DIAGNOSIS — Z6836 Body mass index (BMI) 36.0-36.9, adult: Secondary | ICD-10-CM | POA: Diagnosis not present

## 2022-01-13 DIAGNOSIS — N951 Menopausal and female climacteric states: Secondary | ICD-10-CM | POA: Diagnosis not present

## 2022-01-19 DIAGNOSIS — M7521 Bicipital tendinitis, right shoulder: Secondary | ICD-10-CM | POA: Diagnosis not present

## 2022-01-19 DIAGNOSIS — M75121 Complete rotator cuff tear or rupture of right shoulder, not specified as traumatic: Secondary | ICD-10-CM | POA: Diagnosis not present

## 2022-01-19 DIAGNOSIS — M7551 Bursitis of right shoulder: Secondary | ICD-10-CM | POA: Diagnosis not present

## 2022-01-20 IMAGING — MG MM DIGITAL SCREENING BILAT W/ TOMO AND CAD
6 of 12 series · 6 of 36 positions shown · non-contrast
Comparison: Previous exam(s).

CLINICAL DATA: Screening.

EXAM:
DIGITAL SCREENING BILATERAL MAMMOGRAM WITH TOMOSYNTHESIS AND CAD
TECHNIQUE: Bilateral screening digital craniocaudal and mediolateral oblique
mammograms were obtained. Bilateral screening digital breast
tomosynthesis was performed. The images were evaluated with
computer-aided detection.

[L CC synth-2D (1 of 2)]
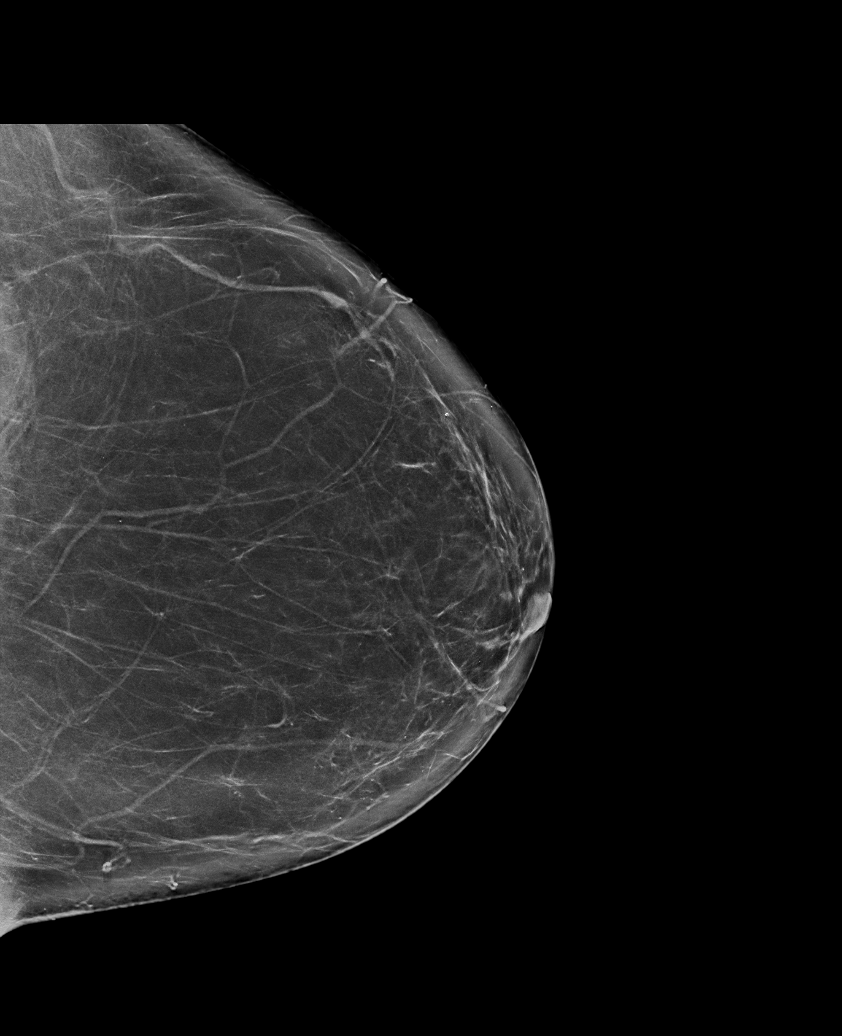

[L MLO synth-2D]
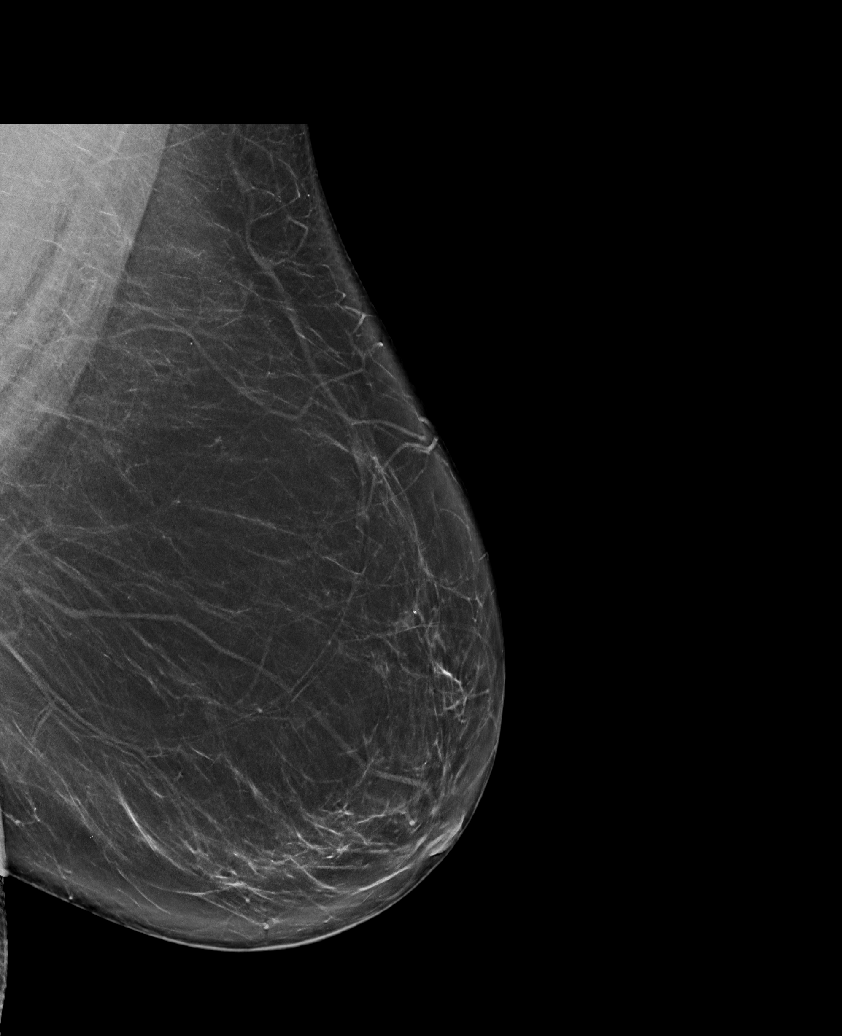

[R CC synth-2D]
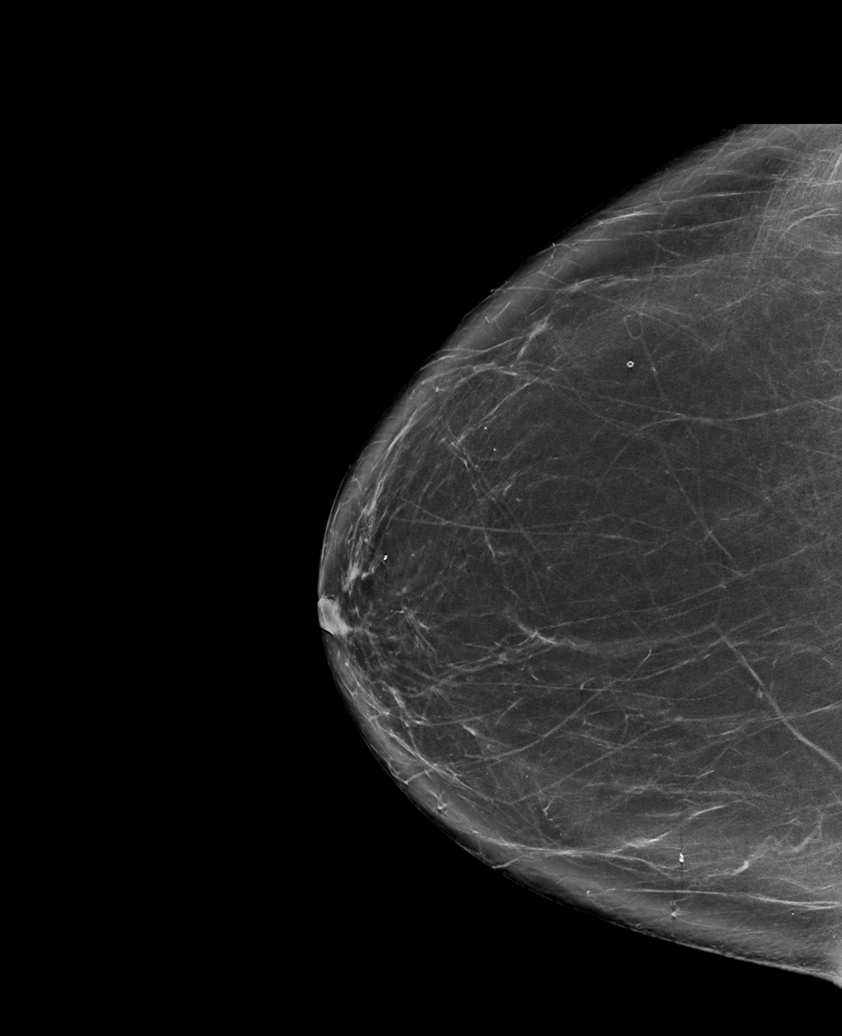

[L CC synth-2D (2 of 2)]
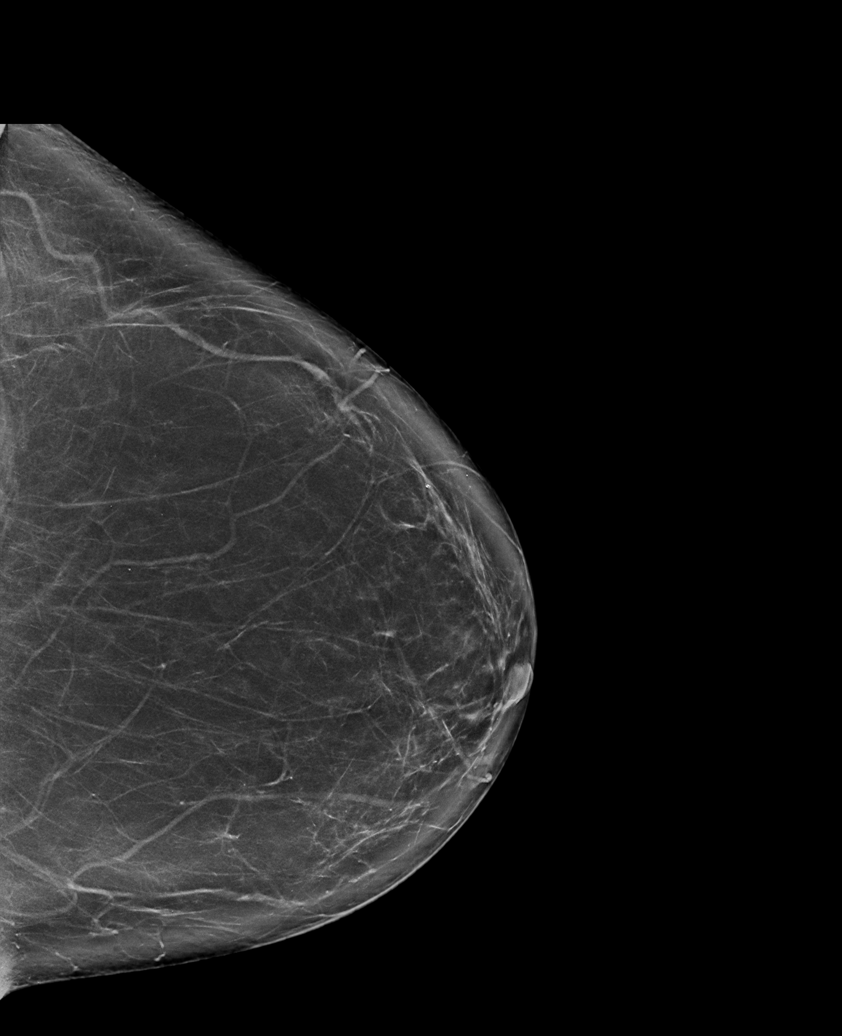

[R MLO synth-2D (1 of 2)]
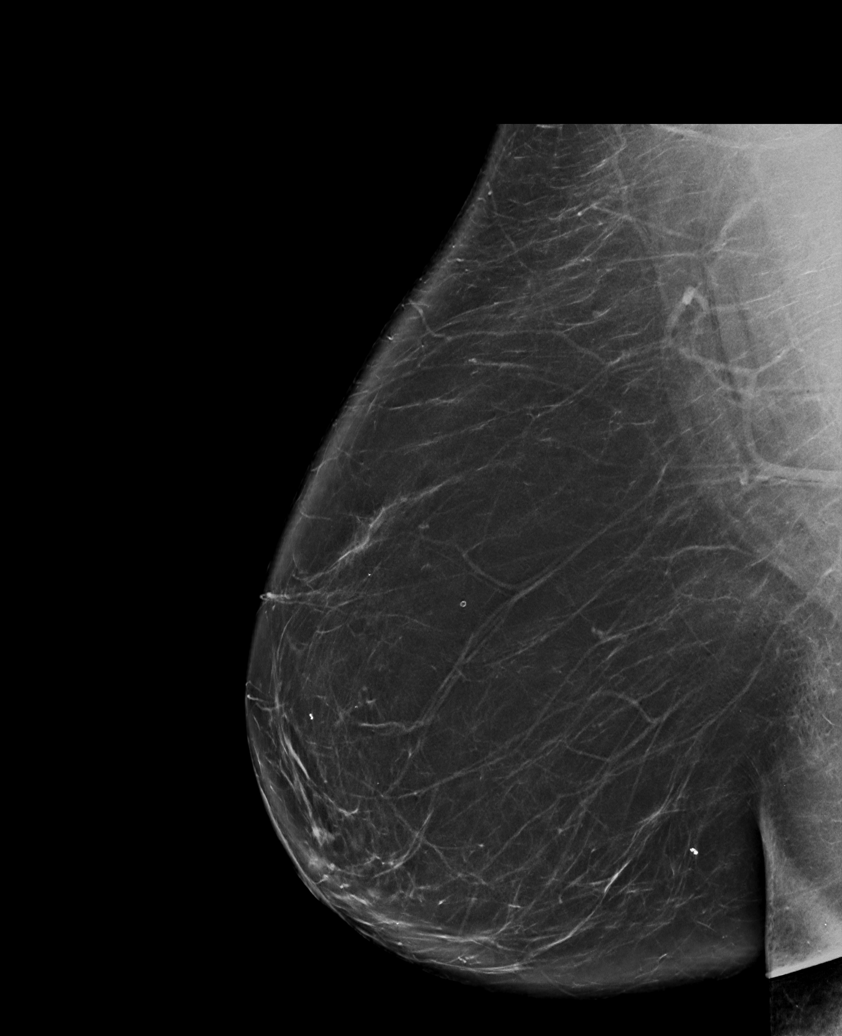

[R MLO synth-2D (2 of 2)]
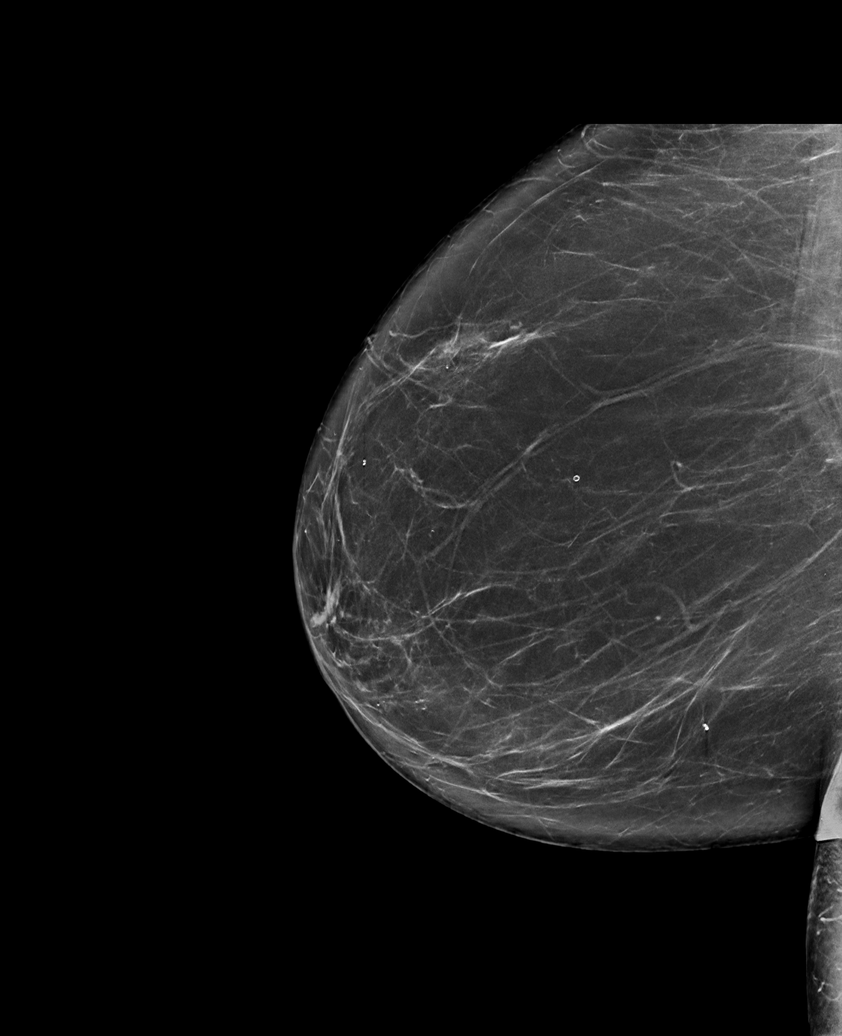

[6 of 36 positions shown; findings below may reference images not displayed]

ACR Breast Density Category b: There are scattered areas of
fibroglandular density.
FINDINGS: There are no findings suspicious for malignancy. The images were
evaluated with computer-aided detection.
IMPRESSION: No mammographic evidence of malignancy. A result letter of this
screening mammogram will be mailed directly to the patient.

RECOMMENDATION:
Screening mammogram in one year. (Code:[id number]-[id number])

BI-RADS CATEGORY  1: Negative.

## 2022-01-25 ENCOUNTER — Other Ambulatory Visit: Payer: Self-pay | Admitting: Family Medicine

## 2022-01-26 ENCOUNTER — Other Ambulatory Visit: Payer: Self-pay | Admitting: Family Medicine

## 2022-01-26 DIAGNOSIS — M7521 Bicipital tendinitis, right shoulder: Secondary | ICD-10-CM | POA: Diagnosis not present

## 2022-01-26 DIAGNOSIS — M75121 Complete rotator cuff tear or rupture of right shoulder, not specified as traumatic: Secondary | ICD-10-CM | POA: Diagnosis not present

## 2022-01-26 DIAGNOSIS — M7551 Bursitis of right shoulder: Secondary | ICD-10-CM | POA: Diagnosis not present

## 2022-01-26 NOTE — Telephone Encounter (Signed)
Requested Prescriptions  Pending Prescriptions Disp Refills  . amLODipine (NORVASC) 5 MG tablet [Pharmacy Med Name: AMLODIPINE BESYLATE 5 MG TAB] 90 tablet 1    Sig: TAKE 1 TABLET (5 MG TOTAL) BY MOUTH DAILY.     Cardiovascular: Calcium Channel Blockers 2 Failed - 01/25/2022 12:13 AM      Failed - Last BP in normal range    BP Readings from Last 1 Encounters:  10/30/21 118/90         Passed - Last Heart Rate in normal range    Pulse Readings from Last 1 Encounters:  10/30/21 87         Passed - Valid encounter within last 6 months    Recent Outpatient Visits          2 months ago Pure hypercholesterolemia   [facility] [place], Priscille Heidelberg, MD   8 months ago Pure hypercholesterolemia   [facility] Pickard, Priscille Heidelberg, MD   1 year ago Essential hypertension, benign   [facility] Pickard, Priscille Heidelberg, MD   1 year ago General medical exam   [facility] Donita Brooks, MD   2 years ago Essential hypertension, benign   [facility] [place], Velna Hatchet, MD

## 2022-02-02 DIAGNOSIS — M75121 Complete rotator cuff tear or rupture of right shoulder, not specified as traumatic: Secondary | ICD-10-CM | POA: Diagnosis not present

## 2022-02-02 DIAGNOSIS — M7521 Bicipital tendinitis, right shoulder: Secondary | ICD-10-CM | POA: Diagnosis not present

## 2022-02-02 DIAGNOSIS — M7551 Bursitis of right shoulder: Secondary | ICD-10-CM | POA: Diagnosis not present

## 2022-02-09 DIAGNOSIS — M75121 Complete rotator cuff tear or rupture of right shoulder, not specified as traumatic: Secondary | ICD-10-CM | POA: Diagnosis not present

## 2022-02-09 DIAGNOSIS — M7521 Bicipital tendinitis, right shoulder: Secondary | ICD-10-CM | POA: Diagnosis not present

## 2022-02-09 DIAGNOSIS — M7551 Bursitis of right shoulder: Secondary | ICD-10-CM | POA: Diagnosis not present

## 2022-02-11 ENCOUNTER — Other Ambulatory Visit: Payer: Self-pay | Admitting: Family Medicine

## 2022-02-11 NOTE — Telephone Encounter (Signed)
Requested Prescriptions  Pending Prescriptions Disp Refills  . desvenlafaxine (PRISTIQ) 50 MG 24 hr tablet [Pharmacy Med Name: DESVENLAFAXINE SUCCNT ER 50 MG] 90 tablet 2    Sig: TAKE 1 TABLET (50 MG TOTAL) BY MOUTH DAILY. STOP PAXIL AND CYMBALTA     Psychiatry: Antidepressants - SNRI - desvenlafaxine & venlafaxine Failed - 02/11/2022 12:12 AM      Failed - Cr in normal range and within 360 days    Creat  Date Value Ref Range Status  10/30/2021 1.07 (H) 0.50 - 1.03 mg/dL Final         Failed - Last BP in normal range    BP Readings from Last 1 Encounters:  10/30/21 118/90         Failed - Lipid Panel in normal range within the last 12 months    Cholesterol  Date Value Ref Range Status  10/30/2021 228 (H) <200 mg/dL Final   LDL Cholesterol (Calc)  Date Value Ref Range Status  10/30/2021 142 (H) mg/dL (calc) Final    Comment:    Reference range: <100 . Desirable range <100 mg/dL for primary prevention;   <70 mg/dL for patients with CHD or diabetic patients  with > or = 2 CHD risk factors. Marland Kitchen LDL-C is now calculated using the Martin-Hopkins  calculation, which is a validated novel method providing  better accuracy than the Friedewald equation in the  estimation of LDL-C.  Horald Pollen et al. Lenox Ahr. 6295;284(13): 2061-2068  (http://education.[facility].com/faq/FAQ164)    HDL  Date Value Ref Range Status  10/30/2021 72 > OR = 50 mg/dL Final   Triglycerides  Date Value Ref Range Status  10/30/2021 57 <150 mg/dL Final         Passed - Completed PHQ-2 or PHQ-9 in the last 360 days      Passed - Valid encounter within last 6 months    Recent Outpatient Visits          3 months ago Pure hypercholesterolemia   [facility] [place], Priscille Heidelberg, MD   9 months ago Pure hypercholesterolemia   [facility] [place], Priscille Heidelberg, MD   1 year ago Essential hypertension, benign   [facility] [place], Priscille Heidelberg, MD   1 year ago  General medical exam   [facility] Donita Brooks, MD   2 years ago Essential hypertension, benign   [facility] [place], Velna Hatchet, MD

## 2022-02-16 DIAGNOSIS — M75121 Complete rotator cuff tear or rupture of right shoulder, not specified as traumatic: Secondary | ICD-10-CM | POA: Diagnosis not present

## 2022-02-16 DIAGNOSIS — M7521 Bicipital tendinitis, right shoulder: Secondary | ICD-10-CM | POA: Diagnosis not present

## 2022-02-16 DIAGNOSIS — M7551 Bursitis of right shoulder: Secondary | ICD-10-CM | POA: Diagnosis not present

## 2022-02-25 DIAGNOSIS — M75121 Complete rotator cuff tear or rupture of right shoulder, not specified as traumatic: Secondary | ICD-10-CM | POA: Diagnosis not present

## 2022-02-25 DIAGNOSIS — M7521 Bicipital tendinitis, right shoulder: Secondary | ICD-10-CM | POA: Diagnosis not present

## 2022-02-25 DIAGNOSIS — M7551 Bursitis of right shoulder: Secondary | ICD-10-CM | POA: Diagnosis not present

## 2022-03-04 DIAGNOSIS — M7551 Bursitis of right shoulder: Secondary | ICD-10-CM | POA: Diagnosis not present

## 2022-03-04 DIAGNOSIS — M75121 Complete rotator cuff tear or rupture of right shoulder, not specified as traumatic: Secondary | ICD-10-CM | POA: Diagnosis not present

## 2022-03-04 DIAGNOSIS — M7521 Bicipital tendinitis, right shoulder: Secondary | ICD-10-CM | POA: Diagnosis not present

## 2022-03-09 DIAGNOSIS — M75121 Complete rotator cuff tear or rupture of right shoulder, not specified as traumatic: Secondary | ICD-10-CM | POA: Diagnosis not present

## 2022-03-09 DIAGNOSIS — M7521 Bicipital tendinitis, right shoulder: Secondary | ICD-10-CM | POA: Diagnosis not present

## 2022-03-09 DIAGNOSIS — M7551 Bursitis of right shoulder: Secondary | ICD-10-CM | POA: Diagnosis not present

## 2022-03-12 ENCOUNTER — Other Ambulatory Visit: Payer: Self-pay | Admitting: Family Medicine

## 2022-03-15 DIAGNOSIS — M545 Low back pain, unspecified: Secondary | ICD-10-CM | POA: Diagnosis not present

## 2022-03-16 DIAGNOSIS — M75121 Complete rotator cuff tear or rupture of right shoulder, not specified as traumatic: Secondary | ICD-10-CM | POA: Diagnosis not present

## 2022-03-16 DIAGNOSIS — M7521 Bicipital tendinitis, right shoulder: Secondary | ICD-10-CM | POA: Diagnosis not present

## 2022-03-16 DIAGNOSIS — M7551 Bursitis of right shoulder: Secondary | ICD-10-CM | POA: Diagnosis not present

## 2022-03-23 DIAGNOSIS — M5440 Lumbago with sciatica, unspecified side: Secondary | ICD-10-CM | POA: Diagnosis not present

## 2022-03-30 DIAGNOSIS — M5440 Lumbago with sciatica, unspecified side: Secondary | ICD-10-CM | POA: Diagnosis not present

## 2022-03-31 DIAGNOSIS — G4733 Obstructive sleep apnea (adult) (pediatric): Secondary | ICD-10-CM | POA: Diagnosis not present

## 2022-04-08 ENCOUNTER — Ambulatory Visit (INDEPENDENT_AMBULATORY_CARE_PROVIDER_SITE_OTHER): Payer: BC Managed Care – PPO | Admitting: Family Medicine

## 2022-04-08 VITALS — BP 120/78 | HR 86 | Temp 97.7°F | Ht 64.0 in | Wt 213.0 lb

## 2022-04-08 DIAGNOSIS — F321 Major depressive disorder, single episode, moderate: Secondary | ICD-10-CM

## 2022-04-08 DIAGNOSIS — R21 Rash and other nonspecific skin eruption: Secondary | ICD-10-CM | POA: Diagnosis not present

## 2022-04-08 DIAGNOSIS — R34 Anuria and oliguria: Secondary | ICD-10-CM | POA: Diagnosis not present

## 2022-04-08 DIAGNOSIS — Z6836 Body mass index (BMI) 36.0-36.9, adult: Secondary | ICD-10-CM

## 2022-04-08 DIAGNOSIS — I10 Essential (primary) hypertension: Secondary | ICD-10-CM | POA: Diagnosis not present

## 2022-04-08 DIAGNOSIS — E119 Type 2 diabetes mellitus without complications: Secondary | ICD-10-CM | POA: Diagnosis not present

## 2022-04-08 MED ORDER — AMLODIPINE BESYLATE 10 MG PO TABS: 10.0000 mg | ORAL_TABLET | Freq: Every day | ORAL | 3 refills | Status: DC

## 2022-04-08 NOTE — Progress Notes (Signed)
Acute Office Visit  Subjective:     Patient ID: Rhonda [name], female    DOB: [birth year], 61 y.o.   MRN: [id number]  Chief Complaint  Patient presents with   Follow-up    rash on chest/face    Ms Baik is here today for several concerns. She is c/o a breakout to her face and neck. She reports this is since a recent increase in her "pellets for hot flashes". She was also started on Spironolactone at the initial time of the breakout. She denies itching. She has tried neosporin and steroid cream.   She also endorses high stress, caregiver fatigue, and  and seasonal affective disorder. She is taking Wellbutrin and Prystiq but complains her depression is not well managed. She is not her usual outgoing and social self. She has tried Cymbalta and Paxil. She does not want to go to a psychiatrist. She denies suicidal ideations.  Her Amlodipine was increased in May from 5mg  to 10mg  daily. Her BP has been well controlled on 10mg  amlodipine without side effects since May and she needs a medication refill.   Review of Systems  Constitutional: Negative.   Cardiovascular: Negative.   Skin:  Positive for rash. Negative for itching.  Neurological: Negative.   Endo/Heme/Allergies: Negative.   Psychiatric/Behavioral:  Positive for depression. Negative for suicidal ideas.    Past Medical History:  Diagnosis Date   Arthritis    knees   Carpal tunnel syndrome of left wrist 07/2016   Depression    History of MRSA infection > 10 years   right leg   Hyperlipidemia    Hypertension    states under control with meds., has been on med. > 2 yr.   OSA (obstructive sleep apnea) 09/05/2019   Past Surgical History:  Procedure Laterality Date   ABDOMINAL HYSTERECTOMY     partial   BREAST EXCISIONAL BIOPSY     CARPAL TUNNEL RELEASE Left 08/26/2016   Procedure: LEFT WRIST CARPAL TUNNEL RELEASE;  Surgeon: 08/2016, MD;  Location: [facility];  Service: Orthopedics;  Laterality:  Left;   COLONOSCOPY WITH PROPOFOL  03/20/2014   LAPAROSCOPIC UNILATERAL SALPINGECTOMY Right 05/18/2002   OVARY BIOPSY Right 05/18/2002   TRIGGER FINGER RELEASE Left 02/10/2017   Procedure: LEFT TRIGGER THUMB RELEASE (TENDON SHEATH INCISION);  Surgeon: 05/20/2002, MD;  Location: [facility];  Service: Orthopedics;  Laterality: Left;   Current Outpatient Medications on File Prior to Visit  Medication Sig Dispense Refill   buPROPion (WELLBUTRIN XL) 300 MG 24 hr tablet TAKE 1 TABLET BY MOUTH EVERY DAY 90 tablet 1   Cholecalciferol (DIALYVITE VITAMIN D 5000) 125 MCG (5000 UT) capsule Take 2 capsules (10,000 Units total) by mouth daily.     desvenlafaxine (PRISTIQ) 50 MG 24 hr tablet TAKE 1 TABLET (50 MG TOTAL) BY MOUTH DAILY. STOP PAXIL AND CYMBALTA 90 tablet 2   ezetimibe (ZETIA) 10 MG tablet Take 1 tablet (10 mg total) by mouth daily. 90 tablet 1   fluticasone (FLONASE) 50 MCG/ACT nasal spray Place into both nostrils as needed.      hydrochlorothiazide (HYDRODIURIL) 25 MG tablet TAKE 1 TABLET (25 MG TOTAL) BY MOUTH DAILY. 90 tablet 0   Multiple Vitamins-Iron (DAILY VITAMIN/IRON) TABS Chelate Iron     polyethylene glycol powder (GLYCOLAX/MIRALAX) 17 GM/SCOOP powder Take 17 g by mouth 2 (two) times daily as needed. 3350 g 1   valsartan (DIOVAN) 320 MG tablet TAKE 1 TABLET BY MOUTH EVERY  DAY 90 tablet 2   atenolol (TENORMIN) 25 MG tablet  (Patient not taking: Reported on 04/08/2022)     mometasone (ELOCON) 0.1 % cream Apply topically daily. (Patient not taking: Reported on 04/08/2022) 15 g 1   montelukast (SINGULAIR) 10 MG tablet Take 1 tablet (10 mg total) by mouth at bedtime. (Patient not taking: Reported on 04/08/2022) 90 tablet 3   progesterone (PROMETRIUM) 100 MG capsule Take by mouth at bedtime. (Patient not taking: Reported on 04/08/2022)     Semaglutide-Weight Management (WEGOVY) 0.5 MG/0.5ML SOAJ Inject 0.5 mg into the skin once a week. (Patient not taking: Reported on  04/08/2022) 2 mL 1   No current facility-administered medications on file prior to visit.   Allergies  Allergen Reactions   Penicillins Hives         Statins Other (See Comments)    MYALGIAS   Lisinopril Cough        Objective:    BP 120/78   Pulse 86   Temp 97.7 F (36.5 C) (Oral)   Ht 5\' 4"  (1.626 m)   Wt 213 lb (96.6 kg)   SpO2 96%   BMI 36.56 kg/m  BP Readings from Last 3 Encounters:  04/08/22 120/78  10/30/21 118/90  10/15/21 (!) 146/94      Physical Exam Vitals and nursing note reviewed.  Constitutional:      Appearance: Normal appearance.  Skin:    General: Skin is warm and dry.     Findings: Rash present. Rash is papular.     Comments: Face and chest  Neurological:     General: No focal deficit present.     Mental Status: She is alert and oriented to person, place, and time.  Psychiatric:        Mood and Affect: Mood normal.        Behavior: Behavior normal.        Thought Content: Thought content normal.        Judgment: Judgment normal.     No results found for any visits on 04/08/22.      Assessment & Plan:  Rash in adult The appearance is consistent with acne as previously diagnosed. She was prescribed Spironolactone for this but I have no records of that. She will call back the ordering provider and have her dose increased and return to me if she needs further management of this.  Essential hypertension, benign Continue Amlodipine 10mg  daily and home BP checks. Return to office for elevated BP readings >140/90, headaches, dizziness, dyspnea, orthopnea. Proceed to ED for chest pain or shortness of breath.  Body mass index (BMI) of 36.0 to 36.9 - Plan: Hemoglobin A1c  Decreased urine output - Plan: COMPLETE METABOLIC PANEL WITH GFR  Depression, major, single episode, moderate ([place]) - Plan: Ambulatory referral to Psychiatry Uncontrolled on current regimen and has tried several treatment modalities. She is reluctant, but agreed to see  psychiatry for medication management.   Return if symptoms worsen or fail to improve.  Rubie Maid, FNP

## 2022-04-08 NOTE — Progress Notes (Signed)
q 

## 2022-04-09 LAB — COMPLETE METABOLIC PANEL WITH GFR
AG Ratio: 1.8 (calc) (ref 1.0–2.5)
ALT: 14 U/L (ref 6–29)
AST: 14 U/L (ref 10–35)
Albumin: 4.5 g/dL (ref 3.6–5.1)
Alkaline phosphatase (APISO): 55 U/L (ref 37–153)
BUN: 16 mg/dL (ref 7–25)
CO2: 27 mmol/L (ref 20–32)
Calcium: 9.7 mg/dL (ref 8.6–10.4)
Chloride: 102 mmol/L (ref 98–110)
Creat: 1.03 mg/dL (ref 0.50–1.03)
Globulin: 2.5 g/dL (calc) (ref 1.9–3.7)
Glucose, Bld: 92 mg/dL (ref 65–99)
Potassium: 4.1 mmol/L (ref 3.5–5.3)
Sodium: 139 mmol/L (ref 135–146)
Total Bilirubin: 0.4 mg/dL (ref 0.2–1.2)
Total Protein: 7 g/dL (ref 6.1–8.1)
eGFR: 63 mL/min/{1.73_m2} (ref >=60)

## 2022-04-09 LAB — HEMOGLOBIN A1C
Hgb A1c MFr Bld: 5.9 % of total Hgb — ABNORMAL HIGH (ref <5.7)
Mean Plasma Glucose: 123 mg/dL
eAG (mmol/L): 6.8 mmol/L

## 2022-04-12 DIAGNOSIS — M5440 Lumbago with sciatica, unspecified side: Secondary | ICD-10-CM | POA: Diagnosis not present

## 2022-04-13 DIAGNOSIS — F32A Depression, unspecified: Secondary | ICD-10-CM | POA: Diagnosis not present

## 2022-04-13 DIAGNOSIS — E782 Mixed hyperlipidemia: Secondary | ICD-10-CM | POA: Diagnosis not present

## 2022-04-13 DIAGNOSIS — I10 Essential (primary) hypertension: Secondary | ICD-10-CM | POA: Diagnosis not present

## 2022-04-13 DIAGNOSIS — K5909 Other constipation: Secondary | ICD-10-CM | POA: Diagnosis not present

## 2022-04-15 ENCOUNTER — Ambulatory Visit (HOSPITAL_BASED_OUTPATIENT_CLINIC_OR_DEPARTMENT_OTHER): Payer: BC Managed Care – PPO | Admitting: Psychiatry

## 2022-04-15 ENCOUNTER — Encounter (HOSPITAL_COMMUNITY): Payer: Self-pay | Admitting: Psychiatry

## 2022-04-15 DIAGNOSIS — F321 Major depressive disorder, single episode, moderate: Secondary | ICD-10-CM | POA: Diagnosis not present

## 2022-04-15 MED ORDER — BUPROPION HCL ER (XL) 150 MG PO TB24: 150.0000 mg | ORAL_TABLET | ORAL | 1 refills | Status: DC

## 2022-04-15 NOTE — Progress Notes (Signed)
Psychiatric Initial Adult Assessment   Patient Identification: Rhonda [name] MRN:  [id number] Date of Evaluation:  04/15/2022 Referral Source: PCP Chief Complaint:   Chief Complaint  Patient presents with   Establish Care   Depression   Visit Diagnosis:    ICD-10-CM   1. Depression, major, single episode, moderate (HCC)  F32.1        Assessment:  Rhonda [name] is a 61 y.o. y.o. female with a history of depression, obstructive sleep apnea who presents virtually to [place] [facility] for initial evaluation on 04/15/22.    Patient reports symptoms of anxiety, increased irritability, increased fatigue, decreased concentration, low mood, and increased need to sleep.  Patient reports that her symptoms tend to be worse in the fall and winter.  She denies any suicidal ideation or thoughts of self-harm along with any history of psychosis, mania, paranoia, or delusions.  Of note patient has sleep apnea which is well-controlled with CPAP, and vitamin D deficiency for which is well-controlled.  Patient's symptoms meet criteria for MDD and she could benefit from medication and therapy.  She declined therapy at this time but will consider again in the future.  In regards to medication she was open to increasing the Wellbutrin and considering as needed options in the future if needed.  A number of assessments were performed during the evaluation today including nutritional assessment which was 0, pain assessment which showed no pain, PHQ-9 which she scored a 5 on, and [place] suicide severity screening which showed no risk.  Based on these assessments patient would benefit from medication adjustment to better target her symptoms.  Plan: - Increase Wellbutrin XL 450 mg QD - Continue Pristiq 50 mg QD - Vit D 10000 units daily managed by PCP - CMP, CBC, lipid profile, Vit D, TSH, A1c reviewd - Can consider therapy in the future if needed - Follow up a  month  History of Present Illness: Rhonda [name] presents reporting that for a long time she gets kind of anxious and on edge in the fall (end of August beginning of September).  She describes as a mental anxiety that can get to the point that she wants to take someone's head off.  She gave an example of the TV being too loud which can make her blow a gasket.  Typically she tends to bottle it in the old if continuously triggered she can let it out verbally.  Patient notes that these episodes only occur when people come over her house.  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  Her mother likes to have people over and this can be frustrating for Rhonda [name].  She does enjoy spending time with family but at other times she feels like she does not have any space for around.  When people do come over her house can get messed up and they do not always help clean things up.  Patient notes that her symptoms can come and go depending on what is going on.  In addition to this anxiety that tends to get worse around the fall.  Patient also reports struggling with depression and seasonal affective disorder.  They were first diagnosed in 1991 following the passing of her father by suicide.  He passed away in 2022-07-13 and she tends to have worsening of her depressed mood around that time every year.  Patient describes the depression as sadness, increased appetite, and  feelings of fatigue.  She has tried a number of medications since then which have worked for a period of time before losing their effect.  Patient notes that her current regimen of Wellbutrin and Pristiq have been working up until the summer of this year.  She does acknowledge that part of the decrease at that time could be because she was less active.  She had shoulder surgery and was not able to engage as much as she typically does.  We discussed medication options and patient expressed that she does not  like to be on many medications.  We explored the possibility of starting a as needed option however she felt like she wanted to hold off on that at this time.  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  Patient was more interested in titrating Wellbutrin during the fall and winter months with the plan to taper in the spring and summer.  She notes she has had good response to this medication in the past and denies any adverse side effects.  Also discussed therapy options however patient felt that that was not needed at this time and will consider it again in the future.  Associated Signs/Symptoms: Depression Symptoms:  depressed mood, anhedonia, hypersomnia, difficulty concentrating, disturbed sleep, (Hypo) Manic Symptoms:   Denies Anxiety Symptoms:   Denies Psychotic Symptoms:   Denies PTSD Symptoms: NA  Past Psychiatric History: Patient reports that her psychiatric care sucked in 1991 following her father's suicide.  She was diagnosed with depression and seasonal affective disorder.  Patient was tried on a number of medications including Cymbalta, Paxil, Zoloft, Lexapro, Wellbutrin, and Pristiq.  She denies trying any mood stabilizers or antipsychotics.  Patient notes that she is prescribed gabapentin for pain and it made her feel a bit loopy.  Patient attended therapy after separating for her husband and reports it was helpful at the time.  She denies any SI/HI or thoughts of self-harm or any prior suicide attempts as well as any past psychiatric hospitalizations.  Patient denies any substance use.  Previous Psychotropic Medications: Yes   Substance Abuse History in the last 12 months:  No.  Consequences of Substance Abuse: NA  Past Medical History:  Past Medical History:  Diagnosis Date   Arthritis    knees   Carpal tunnel syndrome of left wrist 07/2016   Depression    History of MRSA  infection > 10 years   right leg   Hyperlipidemia    Hypertension    states under control with meds., has been on med. > 2 yr.   OSA (obstructive sleep apnea) 09/05/2019    Past Surgical History:  Procedure Laterality Date   ABDOMINAL HYSTERECTOMY     partial   BREAST EXCISIONAL BIOPSY     CARPAL TUNNEL RELEASE Left 08/26/2016   Procedure: LEFT WRIST CARPAL TUNNEL RELEASE;  Surgeon: Ninetta Lights, MD;  Location: No Name;  Service: Orthopedics;  Laterality: Left;   COLONOSCOPY WITH PROPOFOL  03/20/2014   LAPAROSCOPIC UNILATERAL SALPINGECTOMY Right 05/18/2002   OVARY BIOPSY Right 05/18/2002   TRIGGER FINGER RELEASE Left 02/10/2017   Procedure: LEFT TRIGGER THUMB RELEASE (TENDON SHEATH INCISION);  Surgeon: Ninetta Lights, MD;  Location: [facility];  Service: Orthopedics;  Laterality: Left;    Family Psychiatric History: Patient reports that her father had depression and committed suicide in 43.  Family  History:  Family History  Problem Relation Age of Onset   Breast cancer Maternal Aunt     Social History:   Social History   Socioeconomic History   Marital status: Single    Spouse name: Not on file   Number of children: Not on file   Years of education: Not on file   Highest education level: Not on file  Occupational History   Not on file  Tobacco Use   Smoking status: Never   Smokeless tobacco: Never  Substance and Sexual Activity   Alcohol use: No   Drug use: No   Sexual activity: Not on file  Other Topics Concern   Not on file  Social History Narrative   Not on file   Social Determinants of Health   Financial Resource Strain: Not on file  Food Insecurity: Not on file  Transportation Needs: Not on file  Physical Activity: Not on file  Stress: Not on file  Social Connections: Not on file    Additional Social History: Patient lives with her mother and 2 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  1 of whom is her son Rhonda [name] who  is 75 and the other one is 32.  She also has twins 1 of whom is a Social worker.  She is divorced.  Patient enjoys going to church and being active.  She currently participates in yoga, tai chi, and other exercise activities.  She is retired.  Allergies:   Allergies  Allergen Reactions   Penicillins Hives         Statins Other (See Comments)    MYALGIAS   Lisinopril Cough    Metabolic Disorder Labs: Lab Results  Component Value Date   HGBA1C 5.9 (H) 04/08/2022   MPG 123 04/08/2022   MPG 114 08/06/2019   No results found for: "PROLACTIN" Lab Results  Component Value Date   CHOL 228 (H) 10/30/2021   TRIG 57 10/30/2021   HDL 72 10/30/2021   CHOLHDL 3.2 10/30/2021   VLDL 17 08/02/2016   LDLCALC 142 (H) 10/30/2021   LDLCALC 123 (H) 05/14/2021   Lab Results  Component Value Date   TSH 0.82 10/30/2021    Therapeutic Level Labs: No results found for: "LITHIUM" No results found for: "CBMZ" No results found for: "VALPROATE"  Current Medications: Current Outpatient Medications  Medication Sig Dispense Refill   amLODipine (NORVASC) 10 MG tablet Take 1 tablet (10 mg total) by mouth daily. 90 tablet 3   atenolol (TENORMIN) 25 MG tablet  (Patient not taking: Reported on 04/08/2022)     buPROPion (WELLBUTRIN XL) 300 MG 24 hr tablet TAKE 1 TABLET BY MOUTH EVERY DAY 90 tablet 1   Cholecalciferol (DIALYVITE VITAMIN D 5000) 125 MCG (5000 UT) capsule Take 2 capsules (10,000 Units total) by mouth daily.     desvenlafaxine (PRISTIQ) 50 MG 24 hr tablet TAKE 1 TABLET (50 MG TOTAL) BY MOUTH DAILY. STOP PAXIL AND CYMBALTA 90 tablet 2   ezetimibe (ZETIA) 10 MG tablet Take 1 tablet (10 mg total) by mouth daily. 90 tablet 1   fluticasone (FLONASE) 50 MCG/ACT nasal spray Place into both nostrils as needed.      hydrochlorothiazide (HYDRODIURIL) 25 MG tablet TAKE 1 TABLET (25 MG TOTAL) BY MOUTH DAILY. 90 tablet 0   mometasone (ELOCON) 0.1 % cream Apply topically daily. (Patient not taking: Reported  on 04/08/2022) 15 g 1   montelukast (SINGULAIR) 10 MG tablet Take 1 tablet (10 mg total) by mouth at bedtime. (  Patient not taking: Reported on 04/08/2022) 90 tablet 3   Multiple Vitamins-Iron (DAILY VITAMIN/IRON) TABS Chelate Iron     polyethylene glycol powder (GLYCOLAX/MIRALAX) 17 GM/SCOOP powder Take 17 g by mouth 2 (two) times daily as needed. 3350 g 1   progesterone (PROMETRIUM) 100 MG capsule Take by mouth at bedtime. (Patient not taking: Reported on 04/08/2022)     Semaglutide-Weight Management (WEGOVY) 0.5 MG/0.5ML SOAJ Inject 0.5 mg into the skin once a week. (Patient not taking: Reported on 04/08/2022) 2 mL 1   valsartan (DIOVAN) 320 MG tablet TAKE 1 TABLET BY MOUTH EVERY DAY 90 tablet 2   No current facility-administered medications for this visit.    Psychiatric Specialty Exam: Review of Systems  There were no vitals taken for this visit.There is no height or weight on file to calculate BMI.  General Appearance: Well Groomed  Eye Contact:  Good  Speech:  Clear and Coherent  Volume:  Normal  Mood:  Euthymic  Affect:  Congruent  Thought Process:  Coherent  Orientation:  Full (Time, Place, and Person)  Thought Content:  Logical  Suicidal Thoughts:  No  Homicidal Thoughts:  No  Memory:  NA  Judgement:  Good  Insight:  Good  Psychomotor Activity:  Normal  Concentration:  Concentration: Good  Recall:  Good  Fund of Knowledge:Good  Language: Good  Akathisia:  NA    AIMS (if indicated):  not done  Assets:  Communication Skills Desire for Improvement Financial Resources/Insurance Housing Resilience Social Support  ADL's:  Intact  Cognition: WNL  Sleep:  Good   Screenings: GAD-7    Flowsheet Row Office Visit from 10/29/2019 in [place] Office Visit from 08/06/2019 in [place] Office Visit from 03/20/2019 in [place]  Total GAD-7 Score 9 21 12       PHQ2-9    [place] Office Visit from 04/08/2022 in  [place] Office Visit from 05/14/2021 in [place] Office Visit from 04/07/2020 in [place] Visit from 10/29/2019 in [place] Visit from 08/06/2019 in [place]  PHQ-2 Total Score 4 0 0 1 4  PHQ-9 Total Score 15 0 0 5 16        Collaboration of Care: Medication Management AEB medication prescription and Primary Care Provider AEB Chart review  Patient/Guardian was advised Release of Information must be obtained prior to any record release in order to collaborate their care with an outside provider. Patient/Guardian was advised if they have not already done so to contact the registration department to sign all necessary forms in order for [place] to release information regarding their care.   Consent: Patient/Guardian gives verbal consent for treatment and assignment of benefits for services provided during this visit. Patient/Guardian expressed understanding and agreed to proceed.   Vista Mink, MD 10/19/20239:40 AM    Virtual Visit via Video Note  I connected with Rhonda [name] on 04/15/22 at  4:00 PM EDT by a video enabled telemedicine application and verified that I am speaking with the correct person using two identifiers.  Location: Patient: Home Provider: Home office   I discussed the limitations of evaluation and management by telemedicine and the availability of in person appointments. The patient expressed understanding and agreed to proceed.   I discussed the assessment and treatment plan with the patient. The patient was provided an opportunity to ask questions and all were answered. The patient  agreed with the plan and demonstrated an understanding of the instructions.   The patient was advised to call back or seek an in-person evaluation if the symptoms worsen or if the condition fails to improve as anticipated.  I provided 40 minutes of non-face-to-face time during  this encounter.   Vista Mink, MD

## 2022-04-19 DIAGNOSIS — Z713 Dietary counseling and surveillance: Secondary | ICD-10-CM | POA: Diagnosis not present

## 2022-04-19 DIAGNOSIS — M5440 Lumbago with sciatica, unspecified side: Secondary | ICD-10-CM | POA: Diagnosis not present

## 2022-04-20 DIAGNOSIS — Z7189 Other specified counseling: Secondary | ICD-10-CM | POA: Diagnosis not present

## 2022-04-20 DIAGNOSIS — F54 Psychological and behavioral factors associated with disorders or diseases classified elsewhere: Secondary | ICD-10-CM | POA: Diagnosis not present

## 2022-04-21 NOTE — Progress Notes (Signed)
PATIENT: Rhonda [name] DOB: 1961-05-10  REASON FOR VISIT: follow up HISTORY FROM: patient  Chief Complaint  Patient presents with   Follow-up    Pt in room #2 and alone. Pt here today for f/u OSA on CPAP.     HISTORY OF PRESENT ILLNESS:  04/26/22 ALL:  Aila returns for follow up for OSA on CPAP. She was last seen 09/2021 and having difficulty with compliance. Since, she reports doing much better. She is using CPAP nightly for about 5 hours. She is now using a nasal pillow and feels it is much more comfortable than FFM. She denies concerns with machine or supplies. She is planning to have gastric sleeve with Dr Volanda Napoleon.     10/15/2021 ALL: Jenilyn returns for follow up for OSA on CPAP. She was last seen 09/2020 and doing well. She reports being diagnosed with Covid in 03/2021. Since, she has had difficulty using CPAP. She feels that she is smothering. She felt that the pressure was too strong before she could go to sleep. She knows that she needs CPAP. She is much more tired than normal. She understands need for therapy and wishes to resume.     10/15/2020 ALL:  She returns for follow up for OSA on CPAP. She continues to do very well on therapy. She is sleeping more soundly and feels better rested. She is not having to take naps during the day. She is caring for her child with special needs and recently started caring for an adult friend with special needs. She admits that sometimes she falls asleep in her chair reading and forgets to put CPAP mask on. Otherwise, doing very well.      10/15/2019 ALL:  Akaila Rambo is a 61 y.o. female here today for follow up for OSA on CPAP. She is doing well. She has changed her mask and now using nasal pillow. She feels this is a much better fit for her. She feels much better on CPAP therapy.   Compliance report dated 09/11/2019 through 10/10/2019 reveals that she used CPAP 30 of the past 30 days for compliance of 100%.  She used CPAP greater  than 4 hours 27 of the past 30 days for compliance of 90%.  Average usage was 5 hours minutes.  Residual AHI was 4.9 on 6 to 12 cm of water and an EPR of 3.  Leak has significantly improved and 95th percentile now 12.9.  HISTORY: (copied from  note on 07/16/2019)  ZANAE KUEHNLE is a 61 y.o. female here today for follow up. HST revealed severe OSA with AHI 52.3 and O2 nadir of 88%. She returns today for her initial CPAP compliance review.  She has noted significant improvement in sleep quality since starting CPAP therapy.  She is using CPAP nightly.  Her only concern is that she does note a leak around the nasal area of her mask.  She is using a DreamWear full facemask.  She is concerned that it may be the wrong size.  Otherwise she is doing very well   Compliance report dated 06/12/2019 through 07/11/2019 reveals that she used CPAP 30 out of the last 30 days for compliance of 100%.  She is CPAP greater than 4 hours all 30 days for compliance of 100%.  Average usage was 6 hours and 17 minutes.  Residual AHI was 6.2 on 6 to 12 cm of water and an EPR of 3.  Pressure in the 95th percentile of 11.8 with  maximum 11.9.  A leak was noted in the 95th percentile of 22.6.    HISTORY: (copied from Dr Guadelupe Sabin note on 04/05/2019)   Dear Dr. Buelah Manis, I saw your patient, Disaya Kozloff, upon your kind request to my sleep clinic today for initial consultation of her sleep disorder, in particular, concern for underlying obstructive sleep apnea.  The patient is unaccompanied today.  As you know, his Dolinger is a 61 year old right-handed woman with an underlying medical history of arthritis, history of carpal tunnel syndrome with status post surgery on the left, depression, hypertension, and obesity, who reports snoring and excessive daytime somnolence, nonrestorative sleep, morning headaches and witnessed apneas.  The patient reports that she has been told by her daughter that she stops breathing and that snoring has been loud  as reported by her daughter and also by a cousin.  She does not wake up rested.  She has lack of energy.  She has an Epworth sleepiness score of 8 out of 24, fatigue severity score is 49 out of 63.  She tries to nap on the weekend when she can catch up on sleep and may sleep for 3 to 4 hours during what started as a nap.  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  She has an uncle with sleep apnea.  She has had trouble losing weight despite trying.  She lives with her 20 year old mother who stays with her during the week, particularly to help out with patient's 48 year old oldest son who has special needs.  She has a set of twins, age 67, a son and a daughter, she has 29 52-year-old grandson.  She has no night to night nocturia.  She has woken up with the occasional headache, different from her migraines in the past.  Her morning headaches are more dull and achy and pressure-like.  She drinks caffeine in the form of coffee, typically 2 cups/day.  She has had some food allergies but has not been formally tested.  Sometimes when she eats tomatoes for example her tongue swells and she takes Benadryl.  She is reminded to get allergy testing done for this.  Her bedtime is generally between 930 and 1030.  She falls asleep quickly but does not stay asleep well.  She has multiple nighttime awakenings.  Her rise time is around 530.  She has to be at work around 830.   REVIEW OF SYSTEMS: Out of a complete 14 system review of symptoms, the patient complains only of the following symptoms, fatigue and all other reviewed systems are negative.  ESS: 9/24, previously 8/24  ALLERGIES: Allergies  Allergen Reactions   Penicillins Hives         Statins Other (See Comments)    MYALGIAS   Lisinopril Cough    HOME MEDICATIONS: Outpatient Medications Prior to Visit  Medication Sig Dispense Refill   amLODipine (NORVASC) 10 MG tablet Take 1 tablet (10  mg total) by mouth daily. 90 tablet 3   buPROPion (WELLBUTRIN XL) 150 MG 24 hr tablet Take 1 tablet (150 mg total) by mouth every morning. Take with 300 mg for a total of 450 mg QD 30 tablet 1   buPROPion (WELLBUTRIN XL) 300 MG 24 hr tablet TAKE 1 TABLET BY MOUTH EVERY DAY 90 tablet 1   Cholecalciferol (DIALYVITE VITAMIN D 5000) 125 MCG (5000 UT) capsule Take 2 capsules (10,000 Units total) by mouth daily.     desvenlafaxine (PRISTIQ)  50 MG 24 hr tablet TAKE 1 TABLET (50 MG TOTAL) BY MOUTH DAILY. STOP PAXIL AND CYMBALTA 90 tablet 2   ezetimibe (ZETIA) 10 MG tablet Take 1 tablet (10 mg total) by mouth daily. 90 tablet 1   fluticasone (FLONASE) 50 MCG/ACT nasal spray Place into both nostrils as needed.      hydrochlorothiazide (HYDRODIURIL) 25 MG tablet TAKE 1 TABLET (25 MG TOTAL) BY MOUTH DAILY. 90 tablet 0   mometasone (ELOCON) 0.1 % cream Apply topically daily. 15 g 1   Multiple Vitamins-Iron (DAILY VITAMIN/IRON) TABS Chelate Iron     polyethylene glycol powder (GLYCOLAX/MIRALAX) 17 GM/SCOOP powder Take 17 g by mouth 2 (two) times daily as needed. 3350 g 1   valsartan (DIOVAN) 320 MG tablet TAKE 1 TABLET BY MOUTH EVERY DAY 90 tablet 2   montelukast (SINGULAIR) 10 MG tablet Take 1 tablet (10 mg total) by mouth at bedtime. (Patient not taking: Reported on 04/08/2022) 90 tablet 3   progesterone (PROMETRIUM) 100 MG capsule Take by mouth at bedtime. (Patient not taking: Reported on 04/08/2022)     Semaglutide-Weight Management (WEGOVY) 0.5 MG/0.5ML SOAJ Inject 0.5 mg into the skin once a week. (Patient not taking: Reported on 04/08/2022) 2 mL 1   No facility-administered medications prior to visit.    PAST MEDICAL HISTORY: Past Medical History:  Diagnosis Date   Arthritis    knees   Carpal tunnel syndrome of left wrist 07/2016   Depression    History of MRSA infection > 10 years   right leg   Hyperlipidemia    Hypertension    states under control with meds., has been on med. > 2 yr.   OSA  (obstructive sleep apnea) 09/05/2019    PAST SURGICAL HISTORY: Past Surgical History:  Procedure Laterality Date   ABDOMINAL HYSTERECTOMY     partial   BREAST EXCISIONAL BIOPSY     CARPAL TUNNEL RELEASE Left 08/26/2016   Procedure: LEFT WRIST CARPAL TUNNEL RELEASE;  Surgeon: Ninetta Lights, MD;  Location: [facility];  Service: Orthopedics;  Laterality: Left;   COLONOSCOPY WITH PROPOFOL  03/20/2014   LAPAROSCOPIC UNILATERAL SALPINGECTOMY Right 05/18/2002   OVARY BIOPSY Right 05/18/2002   TRIGGER FINGER RELEASE Left 02/10/2017   Procedure: LEFT TRIGGER THUMB RELEASE (TENDON SHEATH INCISION);  Surgeon: Ninetta Lights, MD;  Location: [name];  Service: Orthopedics;  Laterality: Left;    FAMILY HISTORY: Family History  Problem Relation Age of Onset   Breast cancer Maternal Aunt     SOCIAL HISTORY: Social History   Socioeconomic History   Marital status: Single    Spouse name: Not on file   Number of children: Not on file   Years of education: Not on file   Highest education level: Not on file  Occupational History   Not on file  Tobacco Use   Smoking status: Never   Smokeless tobacco: Never  Substance and Sexual Activity   Alcohol use: No   Drug use: No   Sexual activity: Not on file  Other Topics Concern   Not on file  Social History Narrative   Not on file   Social Determinants of Health   Financial Resource Strain: Not on file  Food Insecurity: Not on file  Transportation Needs: Not on file  Physical Activity: Not on file  Stress: Not on file  Social Connections: Not on file  Intimate Partner Violence: Not on file      PHYSICAL EXAM  Vitals:  04/26/22 0752  BP: 120/74  Pulse: 77  Weight: 209 lb (94.8 kg)  Height: 5\' 3"  (1.6 m)     Body mass index is 37.02 kg/m.  Generalized: Well developed, in no acute distress  Cardiology: normal rate and rhythm, no murmur noted Respiratory: clear to auscultation bilaterally   Neurological examination  Mentation: Alert oriented to time, place, history taking. Follows all commands speech and language fluent Cranial nerve II-XII: Pupils were equal round reactive to light. Extraocular movements were full, visual field were full  Motor: The motor testing reveals 5 over 5 strength of all 4 extremities. Good symmetric motor tone is noted throughout.  Gait and station: Gait is normal.   DIAGNOSTIC DATA (LABS, IMAGING, TESTING) - I reviewed patient records, labs, notes, testing and imaging myself where available.      No data to display           Lab Results  Component Value Date   WBC 6.7 10/30/2021   HGB 15.8 (H) 10/30/2021   HCT 45.9 (H) 10/30/2021   MCV 92.5 10/30/2021   PLT 263 10/30/2021      Component Value Date/Time   NA 139 04/08/2022 1240   K 4.1 04/08/2022 1240   CL 102 04/08/2022 1240   CO2 27 04/08/2022 1240   GLUCOSE 92 04/08/2022 1240   BUN 16 04/08/2022 1240   CREATININE 1.03 04/08/2022 1240   CALCIUM 9.7 04/08/2022 1240   PROT 7.0 04/08/2022 1240   ALBUMIN 4.2 08/02/2016 1109   AST 14 04/08/2022 1240   ALT 14 04/08/2022 1240   ALKPHOS 59 08/02/2016 1109   BILITOT 0.4 04/08/2022 1240   GFRNONAA 61 04/07/2020 1006   GFRAA 71 04/07/2020 1006   Lab Results  Component Value Date   CHOL 228 (H) 10/30/2021   HDL 72 10/30/2021   LDLCALC 142 (H) 10/30/2021   TRIG 57 10/30/2021   CHOLHDL 3.2 10/30/2021   Lab Results  Component Value Date   HGBA1C 5.9 (H) 04/08/2022   Lab Results  Component Value Date   VITAMINB12 264 04/25/2007   Lab Results  Component Value Date   TSH 0.82 10/30/2021     ASSESSMENT AND PLAN 61 y.o. year old female  has a past medical history of Arthritis, Carpal tunnel syndrome of left wrist (07/2016), Depression, History of MRSA infection (> 10 years), Hyperlipidemia, Hypertension, and OSA (obstructive sleep apnea) (09/05/2019). here with     ICD-10-CM   1. OSA on CPAP  G47.33 For home use only DME  continuous positive airway pressure (CPAP)      Amrita is doing well on CPAP therapy. Compliance report shows excellent daily compliance and optimal 4 hour compliance. She was encouraged to continue using CPAP nightly and shoot for greater than 4 hours each night. Healthy lifestyle habits encouraged.  She will follow-up with me in 1 year, sooner if needed.  She verbalizes understanding and agreement with this plan.   Orders Placed This Encounter  Procedures   For home use only DME continuous positive airway pressure (CPAP)    Supplies    Order Specific Question:   Length of Need    Answer:   Lifetime    Order Specific Question:   Patient has OSA or probable OSA    Answer:   Yes    Order Specific Question:   Is the patient currently using CPAP in the home    Answer:   Yes    Order Specific Question:   Settings  Answer:   Other see comments    Order Specific Question:   CPAP supplies needed    Answer:   Mask, headgear, cushions, filters, heated tubing and water chamber     No orders of the defined types were placed in this encounter.    Debbora Presto, FNP-C 04/26/2022, 8:15 AM [facility] [address], [place], [place] [address] [phone]

## 2022-04-21 NOTE — Patient Instructions (Signed)

## 2022-04-26 ENCOUNTER — Encounter: Payer: Self-pay | Admitting: Family Medicine

## 2022-04-26 ENCOUNTER — Ambulatory Visit (INDEPENDENT_AMBULATORY_CARE_PROVIDER_SITE_OTHER): Payer: BC Managed Care – PPO | Admitting: Family Medicine

## 2022-04-26 VITALS — BP 120/74 | HR 77 | Ht 63.0 in | Wt 209.0 lb

## 2022-04-26 DIAGNOSIS — G4733 Obstructive sleep apnea (adult) (pediatric): Secondary | ICD-10-CM | POA: Diagnosis not present

## 2022-04-26 DIAGNOSIS — M5440 Lumbago with sciatica, unspecified side: Secondary | ICD-10-CM | POA: Diagnosis not present

## 2022-04-27 ENCOUNTER — Telehealth: Payer: Self-pay | Admitting: Family Medicine

## 2022-04-27 ENCOUNTER — Telehealth: Payer: Self-pay

## 2022-04-27 DIAGNOSIS — L810 Postinflammatory hyperpigmentation: Secondary | ICD-10-CM | POA: Diagnosis not present

## 2022-04-27 DIAGNOSIS — L700 Acne vulgaris: Secondary | ICD-10-CM | POA: Diagnosis not present

## 2022-04-27 NOTE — Telephone Encounter (Signed)
Left message to return call; need to reschedule appt. Dr. Pickard on vacation week of 05/17/22. 

## 2022-04-28 ENCOUNTER — Telehealth: Payer: Self-pay | Admitting: Family Medicine

## 2022-04-28 NOTE — Telephone Encounter (Signed)
LVM relaying mychart message to pt. Asked pt to either send a mychart message back or call us back with information to Dr. Psychologist, occupational.

## 2022-04-28 NOTE — Telephone Encounter (Signed)
Mrs Keeling requested we send a letter to her bariatric surgeon, Dr Volanda Napoleon, stating that she was doing well and barring any unforseen circumstances, she should do well with gastric sleeve procedure. Can you guys please find contact info for me and I will be happy to send this. I think he may be with [facility]? TY!

## 2022-04-29 ENCOUNTER — Other Ambulatory Visit: Payer: Self-pay | Admitting: Family Medicine

## 2022-04-29 ENCOUNTER — Encounter: Payer: Self-pay | Admitting: Neurology

## 2022-04-29 NOTE — Telephone Encounter (Signed)
Letter will be faxed to the number provided by the patient

## 2022-04-30 NOTE — Telephone Encounter (Signed)
Requested Prescriptions  Pending Prescriptions Disp Refills   hydrochlorothiazide (HYDRODIURIL) 25 MG tablet [Pharmacy Med Name: HYDROCHLOROTHIAZIDE 25 MG TAB] 90 tablet 0    Sig: TAKE 1 TABLET (25 MG TOTAL) BY MOUTH DAILY.     Cardiovascular: Diuretics - Thiazide Failed - 04/29/2022  7:00 PM      Failed - Valid encounter within last 6 months    Recent Outpatient Visits           6 months ago Pure hypercholesterolemia   [place] Pickard, Cammie Mcgee, MD   11 months ago Pure hypercholesterolemia   [place] Pickard, Cammie Mcgee, MD   1 year ago Essential hypertension, benign   [place] Pickard, Cammie Mcgee, MD   2 years ago General medical exam   [place] Susy Frizzle, MD   2 years ago Essential hypertension, benign   [place], [place] Nunnery, MD       Future Appointments             In 2 weeks Pickard, Cammie Mcgee, MD [place]            Passed - Cr in normal range and within 180 days    Creat  Date Value Ref Range Status  04/08/2022 1.03 0.50 - 1.03 mg/dL Final         Passed - K in normal range and within 180 days    Potassium  Date Value Ref Range Status  04/08/2022 4.1 3.5 - 5.3 mmol/L Final         Passed - Na in normal range and within 180 days    Sodium  Date Value Ref Range Status  04/08/2022 139 135 - 146 mmol/L Final         Passed - Last BP in normal range    BP Readings from Last 1 Encounters:  04/26/22 120/74

## 2022-05-01 DIAGNOSIS — G4733 Obstructive sleep apnea (adult) (pediatric): Secondary | ICD-10-CM | POA: Diagnosis not present

## 2022-05-03 ENCOUNTER — Telehealth: Payer: Self-pay

## 2022-05-03 NOTE — Telephone Encounter (Signed)
Pt came into office to drop off ppw that will need to be completed and then faxed to the number provided on intake form for  surgery. Pt would just like a cb to verify that this was done. Please advise. Intake form and ppw placed in nurse's folder  Cb#:: [phone]

## 2022-05-07 ENCOUNTER — Ambulatory Visit (INDEPENDENT_AMBULATORY_CARE_PROVIDER_SITE_OTHER): Payer: BC Managed Care – PPO | Admitting: Family Medicine

## 2022-05-07 VITALS — BP 116/82 | HR 82 | Temp 98.0°F | Ht 63.0 in | Wt 208.0 lb

## 2022-05-07 DIAGNOSIS — I10 Essential (primary) hypertension: Secondary | ICD-10-CM

## 2022-05-07 DIAGNOSIS — E7800 Pure hypercholesterolemia, unspecified: Secondary | ICD-10-CM | POA: Diagnosis not present

## 2022-05-07 DIAGNOSIS — Z6836 Body mass index (BMI) 36.0-36.9, adult: Secondary | ICD-10-CM | POA: Diagnosis not present

## 2022-05-07 LAB — LIPID PANEL
Cholesterol: 212 mg/dL — ABNORMAL HIGH (ref <200)
HDL: 67 mg/dL (ref >=50)
LDL Cholesterol (Calc): 129 mg/dL (calc) — ABNORMAL HIGH
Non-HDL Cholesterol (Calc): 145 mg/dL (calc) — ABNORMAL HIGH (ref <130)
Total CHOL/HDL Ratio: 3.2 (calc) (ref <5.0)
Triglycerides: 70 mg/dL (ref <150)

## 2022-05-07 LAB — CBC WITH DIFFERENTIAL/PLATELET
Absolute Monocytes: 473 cells/uL (ref 200–950)
Basophils Absolute: 21 cells/uL (ref 0–200)
Basophils Relative: 0.4 %
Eosinophils Absolute: 62 cells/uL (ref 15–500)
Eosinophils Relative: 1.2 %
HCT: 42.9 % (ref 35.0–45.0)
Hemoglobin: 15 g/dL (ref 11.7–15.5)
Lymphs Abs: 1794 cells/uL (ref 850–3900)
MCH: 31.2 pg (ref 27.0–33.0)
MCHC: 35 g/dL (ref 32.0–36.0)
MCV: 89.2 fL (ref 80.0–100.0)
MPV: 9.8 fL (ref 7.5–12.5)
Monocytes Relative: 9.1 %
Neutro Abs: 2850 cells/uL (ref 1500–7800)
Neutrophils Relative %: 54.8 %
Platelets: 243 10*3/uL (ref 140–400)
RBC: 4.81 10*6/uL (ref 3.80–5.10)
RDW: 13.7 % (ref 11.0–15.0)
Total Lymphocyte: 34.5 %
WBC: 5.2 10*3/uL (ref 3.8–10.8)

## 2022-05-07 NOTE — Progress Notes (Signed)
Subjective:    Patient ID: Rhonda [name], female    DOB: [birth year], 61 y.o.   MRN: [id number]  HPI Patient is here today for medical clearance for surgery.  She is interested in getting a gastric sleeve for obesity.  Her BMI is 36 almost 37.  Insurance would not cover Ozempic.  She has a history of hypertension and hyperlipidemia although her blood pressure today is well controlled at 116/82.  She denies any chest pain shortness of breath or dyspnea on exertion.  She recently had a CMP that was normal.  She had an A1c that showed mild prediabetes at 5.9.  No one has checked her cholesterol since May and she has not had her hemoglobin checked since May. Past Medical History:  Diagnosis Date   Arthritis    knees   Carpal tunnel syndrome of left wrist 07/2016   Depression    History of MRSA infection > 10 years   right leg   Hyperlipidemia    Hypertension    states under control with meds., has been on med. > 2 yr.   OSA (obstructive sleep apnea) 09/05/2019   Past Surgical History:  Procedure Laterality Date   ABDOMINAL HYSTERECTOMY     partial   BREAST EXCISIONAL BIOPSY     CARPAL TUNNEL RELEASE Left 08/26/2016   Procedure: LEFT WRIST CARPAL TUNNEL RELEASE;  Surgeon: Loreta Ave, MD;  Location: [facility];  Service: Orthopedics;  Laterality: Left;   COLONOSCOPY WITH PROPOFOL  03/20/2014   LAPAROSCOPIC UNILATERAL SALPINGECTOMY Right 05/18/2002   OVARY BIOPSY Right 05/18/2002   TRIGGER FINGER RELEASE Left 02/10/2017   Procedure: LEFT TRIGGER THUMB RELEASE (TENDON SHEATH INCISION);  Surgeon: Loreta Ave, MD;  Location: [facility];  Service: Orthopedics;  Laterality: Left;   Current Outpatient Medications on File Prior to Visit  Medication Sig Dispense Refill   amLODipine (NORVASC) 10 MG tablet Take 1 tablet (10 mg total) by mouth daily. 90 tablet 3   buPROPion (WELLBUTRIN XL) 150 MG 24 hr tablet Take 1 tablet (150 mg total) by mouth every  morning. Take with 300 mg for a total of 450 mg QD 30 tablet 1   buPROPion (WELLBUTRIN XL) 300 MG 24 hr tablet TAKE 1 TABLET BY MOUTH EVERY DAY 90 tablet 1   Cholecalciferol (DIALYVITE VITAMIN D 5000) 125 MCG (5000 UT) capsule Take 2 capsules (10,000 Units total) by mouth daily.     desvenlafaxine (PRISTIQ) 50 MG 24 hr tablet TAKE 1 TABLET (50 MG TOTAL) BY MOUTH DAILY. STOP PAXIL AND CYMBALTA 90 tablet 2   ezetimibe (ZETIA) 10 MG tablet Take 1 tablet (10 mg total) by mouth daily. 90 tablet 1   fluticasone (FLONASE) 50 MCG/ACT nasal spray Place into both nostrils as needed.      hydrochlorothiazide (HYDRODIURIL) 25 MG tablet TAKE 1 TABLET (25 MG TOTAL) BY MOUTH DAILY. 90 tablet 0   mometasone (ELOCON) 0.1 % cream Apply topically daily. 15 g 1   polyethylene glycol powder (GLYCOLAX/MIRALAX) 17 GM/SCOOP powder Take 17 g by mouth 2 (two) times daily as needed. 3350 g 1   montelukast (SINGULAIR) 10 MG tablet Take 1 tablet (10 mg total) by mouth at bedtime. (Patient not taking: Reported on 05/07/2022) 90 tablet 3   Multiple Vitamins-Iron (DAILY VITAMIN/IRON) TABS Chelate Iron (Patient not taking: Reported on 05/07/2022)     progesterone (PROMETRIUM) 100 MG capsule Take by mouth at bedtime. (Patient not taking: Reported on 05/07/2022)  Semaglutide-Weight Management (WEGOVY) 0.5 MG/0.5ML SOAJ Inject 0.5 mg into the skin once a week. (Patient not taking: Reported on 05/07/2022) 2 mL 1   valsartan (DIOVAN) 320 MG tablet TAKE 1 TABLET BY MOUTH EVERY DAY (Patient not taking: Reported on 05/07/2022) 90 tablet 2   No current facility-administered medications on file prior to visit.   Allergies  Allergen Reactions   Penicillins Hives         Statins Other (See Comments)    MYALGIAS   Lisinopril Cough   Social History   Socioeconomic History   Marital status: Single    Spouse name: Not on file   Number of children: Not on file   Years of education: Not on file   Highest education level: Not on  file  Occupational History   Not on file  Tobacco Use   Smoking status: Never   Smokeless tobacco: Never  Substance and Sexual Activity   Alcohol use: No   Drug use: No   Sexual activity: Not on file  Other Topics Concern   Not on file  Social History Narrative   Not on file   Social Determinants of Health   Financial Resource Strain: Not on file  Food Insecurity: Not on file  Transportation Needs: Not on file  Physical Activity: Not on file  Stress: Not on file  Social Connections: Not on file  Intimate Partner Violence: Not on file   Family History  Problem Relation Age of Onset   Breast cancer Maternal Aunt      Review of Systems  All other systems reviewed and are negative.      Objective:   Physical Exam Vitals reviewed.  Constitutional:      General: She is not in acute distress.    Appearance: Normal appearance. She is obese. She is not ill-appearing, toxic-appearing or diaphoretic.  HENT:     Head: Normocephalic and atraumatic.     Right Ear: Tympanic membrane, ear canal and external ear normal. There is no impacted cerumen.     Left Ear: Tympanic membrane, ear canal and external ear normal. There is no impacted cerumen.     Nose: Nose normal. No congestion or rhinorrhea.     Mouth/Throat:     Mouth: Mucous membranes are moist.     Pharynx: No oropharyngeal exudate or posterior oropharyngeal erythema.  Eyes:     General: No scleral icterus.       Right eye: No discharge.        Left eye: No discharge.     Extraocular Movements: Extraocular movements intact.     Conjunctiva/sclera: Conjunctivae normal.     Pupils: Pupils are equal, round, and reactive to light.  Neck:     Vascular: No carotid bruit.  Cardiovascular:     Rate and Rhythm: Normal rate and regular rhythm.     Pulses: Normal pulses.     Heart sounds: Normal heart sounds. No murmur heard.    No friction rub. No gallop.  Pulmonary:     Effort: Pulmonary effort is normal. No respiratory  distress.     Breath sounds: Normal breath sounds. No stridor. No wheezing, rhonchi or rales.  Chest:     Chest wall: No tenderness.  Abdominal:     General: Abdomen is flat. There is no distension.     Palpations: Abdomen is soft. There is no mass.     Tenderness: There is no abdominal tenderness. There is no right CVA tenderness, left CVA  tenderness, guarding or rebound.     Hernia: No hernia is present.  Musculoskeletal:        General: Normal range of motion.     Cervical back: Normal range of motion and neck supple. No rigidity or tenderness.     Right lower leg: No edema.     Left lower leg: No edema.  Lymphadenopathy:     Cervical: No cervical adenopathy.  Skin:    General: Skin is warm.     Coloration: Skin is not jaundiced or pale.     Findings: No bruising, erythema, lesion or rash.  Neurological:     General: No focal deficit present.     Mental Status: She is alert and oriented to person, place, and time. Mental status is at baseline.     Cranial Nerves: No cranial nerve deficit.     Sensory: No sensory deficit.     Motor: No weakness.     Coordination: Coordination normal.     Gait: Gait normal.     Deep Tendon Reflexes: Reflexes normal.  Psychiatric:        Mood and Affect: Mood normal.        Behavior: Behavior normal.        Thought Content: Thought content normal.        Judgment: Judgment normal.           Assessment & Plan:  Essential hypertension, benign - Plan: CBC with Differential/Platelet, Lipid panel  Body mass index (BMI) of 36.0 to 36.9  Pure hypercholesterolemia Patient's blood pressure is well controlled.  Check a fasting lipid panel to ensure LDL cholesterol is well controlled.  Check a CBC to make sure there is no preoperative anemia.  She denies any symptoms of unstable angina uncontrolled heart failure.  Therefore if her lab work is normal, the patient is medically cleared to proceed with her upcoming surgery

## 2022-05-08 ENCOUNTER — Other Ambulatory Visit (HOSPITAL_COMMUNITY): Payer: Self-pay | Admitting: Psychiatry

## 2022-05-08 DIAGNOSIS — F321 Major depressive disorder, single episode, moderate: Secondary | ICD-10-CM

## 2022-05-10 DIAGNOSIS — Z713 Dietary counseling and surveillance: Secondary | ICD-10-CM | POA: Diagnosis not present

## 2022-05-11 DIAGNOSIS — F32A Depression, unspecified: Secondary | ICD-10-CM | POA: Diagnosis not present

## 2022-05-11 DIAGNOSIS — I10 Essential (primary) hypertension: Secondary | ICD-10-CM | POA: Diagnosis not present

## 2022-05-11 DIAGNOSIS — E782 Mixed hyperlipidemia: Secondary | ICD-10-CM | POA: Diagnosis not present

## 2022-05-12 DIAGNOSIS — Z6836 Body mass index (BMI) 36.0-36.9, adult: Secondary | ICD-10-CM | POA: Diagnosis not present

## 2022-05-12 DIAGNOSIS — Z136 Encounter for screening for cardiovascular disorders: Secondary | ICD-10-CM | POA: Diagnosis not present

## 2022-05-12 DIAGNOSIS — G4733 Obstructive sleep apnea (adult) (pediatric): Secondary | ICD-10-CM | POA: Diagnosis not present

## 2022-05-12 DIAGNOSIS — E669 Obesity, unspecified: Secondary | ICD-10-CM | POA: Diagnosis not present

## 2022-05-14 ENCOUNTER — Other Ambulatory Visit: Payer: Self-pay | Admitting: Family Medicine

## 2022-05-17 ENCOUNTER — Encounter: Payer: Self-pay | Admitting: Family Medicine

## 2022-05-17 ENCOUNTER — Encounter: Payer: BC Managed Care – PPO | Admitting: Family Medicine

## 2022-05-18 ENCOUNTER — Encounter (HOSPITAL_COMMUNITY): Payer: Self-pay | Admitting: Psychiatry

## 2022-05-18 ENCOUNTER — Encounter: Payer: BC Managed Care – PPO | Admitting: Family Medicine

## 2022-05-18 ENCOUNTER — Telehealth (HOSPITAL_BASED_OUTPATIENT_CLINIC_OR_DEPARTMENT_OTHER): Payer: BC Managed Care – PPO | Admitting: Psychiatry

## 2022-05-18 DIAGNOSIS — F321 Major depressive disorder, single episode, moderate: Secondary | ICD-10-CM | POA: Diagnosis not present

## 2022-05-18 MED ORDER — BUPROPION HCL ER (XL) 450 MG PO TB24: 450.0000 mg | ORAL_TABLET | ORAL | 1 refills | Status: DC

## 2022-05-18 MED ORDER — BUPROPION HCL ER (XL) 450 MG PO TB24: 450.0000 mg | ORAL_TABLET | ORAL | 2 refills | Status: DC

## 2022-05-18 NOTE — Progress Notes (Signed)
BH MD/PA/NP OP Progress Note  05/18/2022 4:00 PM Rhonda [name]  MRN:  [id number]  Visit Diagnosis:    ICD-10-CM   1. Depression, major, single episode, moderate (HCC)  F32.1       Assessment: Rhonda [name] is a 61 y.o. female with a history of depression, obstructive sleep apnea who presented to [facility] at [facility] for initial evaluation on 04/15/22.    During initial evaluation patient reported symptoms of anxiety, increased irritability, increased fatigue, decreased concentration, low mood, and increased need to sleep.  Patient reported that her symptoms tend to be worse in the fall and winter.  She denied any suicidal ideation or thoughts of self-harm along with any history of psychosis, mania, paranoia, or delusions.  Of note patient has sleep apnea which is well-controlled with CPAP, and vitamin D deficiency which is well-controlled.  Patient's symptoms meet criteria for MDD at time of initial evaluation.  She declined therapy at that time but will consider again in the future.   Rhonda [name] presents for follow-up evaluation. Today, 05/18/22, patient reports an improvement in her mood symptoms over the past month.  She reports that her need for sleep has returned to its baseline, she has had more energy, better concentration, less irritability, and decreased anxiety.  Patient has been able to get back to her old routine and is looking forward to seeing family over the holidays.  Patient denies any adverse side effects from the medication.  We will continue on her current regimen and plan to reassess in 3 months.  If patient remains stable then we will plan to transition back to PCP.   Of note patient is planning to get a gastric bypass and had been scheduled for December 4 pending clearance.  This provider spoke with the patients therapist, Silvestre Gunner, in relation to the procedure and the clearance process.  Rhonda [name] will send over paperwork to be  completed tomorrow.  Plan: - Continue Wellbutrin XL 450 mg QD, can consider reducing the dose back down to 300 mg during the spring/summer - Continue Pristiq 50 mg QD - Vit D 10000 units daily managed by PCP - CMP, CBC, lipid profile, Vit D, TSH, A1c reviewd - Can consider therapy in the future if needed - Follow up 3 months and transition back to PCP if stable   Chief Complaint:  Chief Complaint  Patient presents with   Follow-up   HPI: Rhonda [name] presents reporting that she is doing great and feels like the medication made her feel 100% better.  She notes that over the past month she has gone back to her old self and has been excited for the holidays.  She has started to do things around her house again including decorating and cleaning.  Patient also reports looking forward to her family coming which is not something she has felt in a while.  She denies any adverse side effects from the medication and would like to continue on her current regimen.  Patient does note that she does still want to consider the possibility of decreasing the dose during the summer when her moods typically improved.  We were in agreement with this and will plan to follow up with the patient in 3 months.  If she remains stable patient would like to transition her care back to her PCP.    Rhonda [name] was scheduled for a gastric bypass on December 4 however notes that she is waiting on psychiatric clearance to do so.  We discussed how we have not done a full psychiatric evaluation for bariatric surgery.  Patient reported that the clearance they were looking for was not a full psych eval that she had that done by the psychologist.  Instead they were looking for clearance in the sense of medications and any concerns related to that and the surgery.  Patient gave the number for Rhonda [name] who was the therapist working with the GI clinic in regards to her gastric bypass.Spoke with said therapist who confirmed that he had cleared her  for the gastric bypass. The clearance he was looking for from this provider was in relation to ability to handle her medications and make adjustments if needed following her procedure. As well as to acknowledge any other psychiatric co morbidities/concerns for SI.  Past Psychiatric History: Patient reports that her psychiatric care started in 1991 following her father's suicide.  She was diagnosed with depression and seasonal affective disorder.  Patient was tried on a number of medications including Cymbalta, Paxil, Zoloft, Lexapro, Wellbutrin, and Pristiq.  She denies trying any mood stabilizers or antipsychotics.  Patient notes that she is prescribed gabapentin for pain and it made her feel a bit loopy.  Patient attended therapy after separating for her husband and reports it was helpful at the time.  She denies any SI/HI or thoughts of self-harm or any prior suicide attempts as well as any past psychiatric hospitalizations.  Patient denies any substance use.  Past Medical History:  Past Medical History:  Diagnosis Date   Arthritis    knees   Carpal tunnel syndrome of left wrist 07/2016   Depression    History of MRSA infection > 10 years   right leg   Hyperlipidemia    Hypertension    states under control with meds., has been on med. > 2 yr.   OSA (obstructive sleep apnea) 09/05/2019    Past Surgical History:  Procedure Laterality Date   ABDOMINAL HYSTERECTOMY     partial   BREAST EXCISIONAL BIOPSY     CARPAL TUNNEL RELEASE Left 08/26/2016   Procedure: LEFT WRIST CARPAL TUNNEL RELEASE;  Surgeon: Loreta Ave, MD;  Location: [facility];  Service: Orthopedics;  Laterality: Left;   COLONOSCOPY WITH PROPOFOL  03/20/2014   LAPAROSCOPIC UNILATERAL SALPINGECTOMY Right 05/18/2002   OVARY BIOPSY Right 05/18/2002   TRIGGER FINGER RELEASE Left 02/10/2017   Procedure: LEFT TRIGGER THUMB RELEASE (TENDON SHEATH INCISION);  Surgeon: Loreta Ave, MD;  Location: [facility];  Service: Orthopedics;  Laterality: Left;    Family Psychiatric History: Patient reports that her father had depression and committed suicide in 25.  Family History:  Family History  Problem Relation Age of Onset   Breast cancer Maternal Aunt     Social History:  Social History   Socioeconomic History   Marital status: Single    Spouse name: Not on file   Number of children: Not on file   Years of education: Not on file   Highest education level: Not on file  Occupational History   Not on file  Tobacco Use   Smoking status: Never   Smokeless tobacco: Never  Substance and Sexual Activity   Alcohol use: No   Drug use: No   Sexual activity: Not on file  Other Topics Concern   Not on file  Social History Narrative   Not on file   Social Determinants of Health   Financial Resource Strain: Not on file  Food Insecurity: Not on file  Transportation Needs: Not on file  Physical Activity: Not on file  Stress: Not on file  Social Connections: Not on file    Allergies:  Allergies  Allergen Reactions   Penicillins Hives         Statins Other (See Comments)    MYALGIAS   Lisinopril Cough    Current Medications: Current Outpatient Medications  Medication Sig Dispense Refill   amLODipine (NORVASC) 10 MG tablet Take 1 tablet (10 mg total) by mouth daily. 90 tablet 3   buPROPion (WELLBUTRIN XL) 150 MG 24 hr tablet Take 1 tablet (150 mg total) by mouth every morning. Take with 300 mg for a total of 450 mg QD 30 tablet 1   buPROPion (WELLBUTRIN XL) 300 MG 24 hr tablet TAKE 1 TABLET BY MOUTH EVERY DAY 90 tablet 1   Cholecalciferol (DIALYVITE VITAMIN D 5000) 125 MCG (5000 UT) capsule Take 2 capsules (10,000 Units total) by mouth daily.     desvenlafaxine (PRISTIQ) 50 MG 24 hr tablet TAKE 1 TABLET (50 MG TOTAL) BY MOUTH DAILY. STOP PAXIL AND CYMBALTA 90 tablet 2   ezetimibe (ZETIA) 10 MG tablet Take 1 tablet (10 mg total) by mouth daily. 90 tablet 1    fluticasone (FLONASE) 50 MCG/ACT nasal spray Place into both nostrils as needed.      hydrochlorothiazide (HYDRODIURIL) 25 MG tablet TAKE 1 TABLET (25 MG TOTAL) BY MOUTH DAILY. 90 tablet 0   mometasone (ELOCON) 0.1 % cream Apply topically daily. 15 g 1   montelukast (SINGULAIR) 10 MG tablet Take 1 tablet (10 mg total) by mouth at bedtime. (Patient not taking: Reported on 05/07/2022) 90 tablet 3   Multiple Vitamins-Iron (DAILY VITAMIN/IRON) TABS Chelate Iron (Patient not taking: Reported on 05/07/2022)     polyethylene glycol powder (GLYCOLAX/MIRALAX) 17 GM/SCOOP powder Take 17 g by mouth 2 (two) times daily as needed. 3350 g 1   progesterone (PROMETRIUM) 100 MG capsule Take by mouth at bedtime. (Patient not taking: Reported on 05/07/2022)     Semaglutide-Weight Management (WEGOVY) 0.5 MG/0.5ML SOAJ Inject 0.5 mg into the skin once a week. (Patient not taking: Reported on 05/07/2022) 2 mL 1   valsartan (DIOVAN) 320 MG tablet TAKE 1 TABLET BY MOUTH EVERY DAY 90 tablet 2   No current facility-administered medications for this visit.     Psychiatric Specialty Exam: Review of Systems  There were no vitals taken for this visit.There is no height or weight on file to calculate BMI.  General Appearance: Fairly Groomed  Eye Contact:  Good  Speech:  Clear and Coherent and Normal Rate  Volume:  Normal  Mood:  Euthymic  Affect:  Congruent  Thought Process:  Coherent, Goal Directed, and Linear  Orientation:  Full (Time, Place, and Person)  Thought Content: Logical   Suicidal Thoughts:  No  Homicidal Thoughts:  No  Memory:  NA  Judgement:  Good  Insight:  Good  Psychomotor Activity:  Normal  Concentration:  Concentration: Good  Recall:  Good  Fund of Knowledge: Good  Language: Good  Akathisia:  NA    AIMS (if indicated): not done  Assets:  Communication Skills Desire for Improvement Housing Leisure Time Physical Health Resilience Social  Support Talents/Skills Transportation Vocational/Educational  ADL's:  Intact  Cognition: WNL  Sleep:  Good   Metabolic Disorder Labs: Lab Results  Component Value Date   HGBA1C 5.9 (H) 04/08/2022   MPG 123 04/08/2022   MPG 114 08/06/2019  No results found for: "PROLACTIN" Lab Results  Component Value Date   CHOL 212 (H) 05/07/2022   TRIG 70 05/07/2022   HDL 67 05/07/2022   CHOLHDL 3.2 05/07/2022   VLDL 17 08/02/2016   LDLCALC 129 (H) 05/07/2022   LDLCALC 142 (H) 10/30/2021   Lab Results  Component Value Date   TSH 0.82 10/30/2021   TSH 0.63 03/20/2019    Therapeutic Level Labs: No results found for: "LITHIUM" No results found for: "VALPROATE" No results found for: "CBMZ"   Screenings: GAD-7    Flowsheet Row Office Visit from 05/07/2022 in [facility] Office Visit from 10/29/2019 in [facility] Office Visit from 08/06/2019 in [facility] Office Visit from 03/20/2019 in [facility]  Total GAD-7 Score 0 9 21 12       PHQ2-9    Flowsheet Row Office Visit from 05/07/2022 in [facility] Office Visit from 04/15/2022 in [facility] Office Visit from 04/08/2022 in [facility] Office Visit from 05/14/2021 in [facility] Office Visit from 04/07/2020 in [facility]  PHQ-2 Total Score 0 1 4 0 0  PHQ-9 Total Score 1 5 15  0 0       Collaboration of Care: Collaboration of Care: Medication Management AEB medication prescription, Primary Care Provider AEB chart review, Other provider involved in patient's care AEB neurology and bariatric chart review, and Referral or follow-up with counselor/therapist AEB discussed case with [place] who evaluated and cleared patient for gastric bypass  Patient/Guardian was advised Release of Information must be obtained prior to any record release in order to collaborate  their care with an outside provider. Patient/Guardian was advised if they have not already done so to contact the registration department to sign all necessary forms in order for to release information regarding their care.   Consent: Patient/Guardian gives verbal consent for treatment and assignment of benefits for services provided during this visit. Patient/Guardian expressed understanding and agreed to proceed.    Lillia Abed, MD 05/18/2022, 4:00 PM   Virtual Visit via Video Note  I connected with Rhonda [name] on 05/18/22 at  4:00 PM EST by a video enabled telemedicine application and verified that I am speaking with the correct person using two identifiers.  Location: Patient: Home Provider: Home Office   I discussed the limitations of evaluation and management by telemedicine and the availability of in person appointments. The patient expressed understanding and agreed to proceed.   I discussed the assessment and treatment plan with the patient. The patient was provided an opportunity to ask questions and all were answered. The patient agreed with the plan and demonstrated an understanding of the instructions.   The patient was advised to call back or seek an in-person evaluation if the symptoms worsen or if the condition fails to improve as anticipated.  I provided 25 minutes of non-face-to-face time during this encounter.   Aquilla Solian, MD

## 2022-05-19 ENCOUNTER — Telehealth (HOSPITAL_COMMUNITY): Payer: Self-pay | Admitting: *Deleted

## 2022-05-19 DIAGNOSIS — J029 Acute pharyngitis, unspecified: Secondary | ICD-10-CM | POA: Diagnosis not present

## 2022-05-19 DIAGNOSIS — U071 COVID-19: Secondary | ICD-10-CM | POA: Diagnosis not present

## 2022-05-19 DIAGNOSIS — Z6836 Body mass index (BMI) 36.0-36.9, adult: Secondary | ICD-10-CM | POA: Diagnosis not present

## 2022-05-19 DIAGNOSIS — R051 Acute cough: Secondary | ICD-10-CM | POA: Diagnosis not present

## 2022-05-19 NOTE — Telephone Encounter (Signed)
Writer spoke with pt to advise that the Pre-Operative Medical/Specialist Clearance form has been completed by Dr. Mercy Riding. Pt requested to have form faxed directly to [facility], Kathryne Sharper. Form faxed to [phone], copy made for our records and original to be scanned to EMR.

## 2022-05-25 ENCOUNTER — Encounter: Payer: Self-pay | Admitting: Family Medicine

## 2022-05-25 ENCOUNTER — Ambulatory Visit (INDEPENDENT_AMBULATORY_CARE_PROVIDER_SITE_OTHER): Payer: BC Managed Care – PPO | Admitting: Family Medicine

## 2022-05-25 VITALS — BP 115/70 | HR 99 | Temp 98.1°F | Ht 63.0 in | Wt 208.0 lb

## 2022-05-25 DIAGNOSIS — Z0000 Encounter for general adult medical examination without abnormal findings: Secondary | ICD-10-CM

## 2022-05-25 DIAGNOSIS — Z6835 Body mass index (BMI) 35.0-35.9, adult: Secondary | ICD-10-CM | POA: Diagnosis not present

## 2022-05-25 NOTE — Assessment & Plan Note (Addendum)
Patient here today for complete annual physical exam for work. She has had labs drawn in the last 1-2 months so will defer those. She is planning for bariatric surgery in the near future. She reports eating a heart healthy diet and exercising at home and at the gym almost daily. Health maintenance items up to date and she will obtain flu vaccine at local pharmacy as she has recently been sick. Continue current medication regimen for chronic conditions.

## 2022-05-25 NOTE — Progress Notes (Signed)
Complete physical exam  Patient: Rhonda [name]   DOB: 1960/12/12   61 y.o. Female  MRN: [id number]  Subjective:    Chief Complaint  Patient presents with   Employment Physical    Woodie Trusty is a 61 y.o. female who presents today for a complete physical exam. She reports consuming a general, low fat, and low sodium diet. Home exercise routine includes walking 0.5 hrs per day. She generally feels well. She reports sleeping well. She does not have additional problems to discuss today.    Most recent fall risk assessment:    05/25/2022   11:59 AM  Fall Risk   Falls in the past year? 0     Most recent depression screenings:    05/25/2022   11:59 AM 05/07/2022   10:20 AM  PHQ 2/9 Scores  PHQ - 2 Score 0 0  PHQ- 9 Score 0 1    Dental: No current dental problems  Past Medical History:  Diagnosis Date   Arthritis    knees   Carpal tunnel syndrome of left wrist 07/2016   Depression    History of MRSA infection > 10 years   right leg   Hyperlipidemia    Hypertension    states under control with meds., has been on med. > 2 yr.   OSA (obstructive sleep apnea) 09/05/2019   Past Surgical History:  Procedure Laterality Date   ABDOMINAL HYSTERECTOMY     partial   BREAST EXCISIONAL BIOPSY     CARPAL TUNNEL RELEASE Left 08/26/2016   Procedure: LEFT WRIST CARPAL TUNNEL RELEASE;  Surgeon: Ninetta Lights, MD;  Location: [place];  Service: Orthopedics;  Laterality: Left;   COLONOSCOPY WITH PROPOFOL  03/20/2014   LAPAROSCOPIC UNILATERAL SALPINGECTOMY Right 05/18/2002   OVARY BIOPSY Right 05/18/2002   TRIGGER FINGER RELEASE Left 02/10/2017   Procedure: LEFT TRIGGER THUMB RELEASE (TENDON SHEATH INCISION);  Surgeon: Ninetta Lights, MD;  Location: [place];  Service: Orthopedics;  Laterality: Left;   Family History  Problem Relation Age of Onset   Breast cancer Maternal Aunt    Allergies  Allergen Reactions   Penicillins Hives          Statins Other (See Comments)    MYALGIAS   Lisinopril Cough      Patient Care Team: Susy Frizzle, MD as PCP - General (Family Medicine)   Outpatient Medications Prior to Visit  Medication Sig   amLODipine (NORVASC) 10 MG tablet Take 1 tablet (10 mg total) by mouth daily.   buPROPion HCl ER, XL, 450 MG TB24 Take 450 mg by mouth every morning. Take with 300 mg for a total of 450 mg QD   Cholecalciferol (DIALYVITE VITAMIN D 5000) 125 MCG (5000 UT) capsule Take 2 capsules (10,000 Units total) by mouth daily.   desvenlafaxine (PRISTIQ) 50 MG 24 hr tablet TAKE 1 TABLET (50 MG TOTAL) BY MOUTH DAILY. STOP PAXIL AND CYMBALTA   ezetimibe (ZETIA) 10 MG tablet Take 1 tablet (10 mg total) by mouth daily.   fluticasone (FLONASE) 50 MCG/ACT nasal spray Place into both nostrils as needed.    hydrochlorothiazide (HYDRODIURIL) 25 MG tablet TAKE 1 TABLET (25 MG TOTAL) BY MOUTH DAILY.   mometasone (ELOCON) 0.1 % cream Apply topically daily.   montelukast (SINGULAIR) 10 MG tablet Take 1 tablet (10 mg total) by mouth at bedtime.   Multiple Vitamins-Iron (DAILY VITAMIN/IRON) TABS Chelate Iron   polyethylene glycol powder (GLYCOLAX/MIRALAX) 17 GM/SCOOP  powder Take 17 g by mouth 2 (two) times daily as needed.   progesterone (PROMETRIUM) 100 MG capsule Take by mouth at bedtime.   valsartan (DIOVAN) 320 MG tablet TAKE 1 TABLET BY MOUTH EVERY DAY   Semaglutide-Weight Management (WEGOVY) 0.5 MG/0.5ML SOAJ Inject 0.5 mg into the skin once a week. (Patient not taking: Reported on 05/25/2022)   No facility-administered medications prior to visit.    Review of Systems  Constitutional: Negative.   HENT: Negative.    Eyes: Negative.   Respiratory: Negative.    Cardiovascular: Negative.   Gastrointestinal: Negative.   Genitourinary: Negative.   Musculoskeletal: Negative.   Skin: Negative.   Neurological: Negative.   Endo/Heme/Allergies: Negative.   Psychiatric/Behavioral: Negative.    All other  systems reviewed and are negative.         Objective:     BP 115/70   Pulse 99   Temp 98.1 F (36.7 C) (Oral)   Ht _0  (1.6 m)   Wt 208 lb (94.3 kg)   SpO2 99%   BMI 36.85 kg/m  BP Readings from Last 3 Encounters:  05/25/22 115/70  05/07/22 116/82  04/26/22 120/74   Wt Readings from Last 3 Encounters:  05/25/22 208 lb (94.3 kg)  05/07/22 208 lb (94.3 kg)  04/26/22 209 lb (94.8 kg)      Physical Exam Vitals and nursing note reviewed.  Constitutional:      Appearance: Normal appearance. She is obese.  HENT:     Head: Normocephalic and atraumatic.     Right Ear: Tympanic membrane, ear canal and external ear normal.     Left Ear: Tympanic membrane, ear canal and external ear normal.     Nose: Nose normal.     Mouth/Throat:     Mouth: Mucous membranes are moist.     Pharynx: Oropharynx is clear.  Eyes:     Extraocular Movements: Extraocular movements intact.     Conjunctiva/sclera: Conjunctivae normal.     Pupils: Pupils are equal, round, and reactive to light.  Cardiovascular:     Rate and Rhythm: Normal rate and regular rhythm.     Pulses: Normal pulses.     Heart sounds: Normal heart sounds.  Pulmonary:     Effort: Pulmonary effort is normal.     Breath sounds: Normal breath sounds.  Abdominal:     General: Bowel sounds are normal.     Palpations: Abdomen is soft.  Musculoskeletal:        General: Normal range of motion.     Cervical back: Normal range of motion and neck supple.  Skin:    General: Skin is warm and dry.     Capillary Refill: Capillary refill takes less than 2 seconds.  Neurological:     General: No focal deficit present.     Mental Status: She is alert and oriented to person, place, and time. Mental status is at baseline.  Psychiatric:        Mood and Affect: Mood normal.        Behavior: Behavior normal.        Thought Content: Thought content normal.        Judgment: Judgment normal.      No results found for any visits on  05/25/22. Last CBC Lab Results  Component Value Date   WBC 5.2 05/07/2022   HGB 15.0 05/07/2022   HCT 42.9 05/07/2022   MCV 89.2 05/07/2022   MCH 31.2 05/07/2022   RDW 13.7 05/07/2022  PLT 243 76/54/6503   Last metabolic panel Lab Results  Component Value Date   GLUCOSE 92 04/08/2022   NA 139 04/08/2022   K 4.1 04/08/2022   CL 102 04/08/2022   CO2 27 04/08/2022   BUN 16 04/08/2022   CREATININE 1.03 04/08/2022   EGFR 63 04/08/2022   CALCIUM 9.7 04/08/2022   PROT 7.0 04/08/2022   ALBUMIN 4.2 08/02/2016   BILITOT 0.4 04/08/2022   ALKPHOS 59 08/02/2016   AST 14 04/08/2022   ALT 14 04/08/2022   ANIONGAP 7 02/08/2017   Last lipids Lab Results  Component Value Date   CHOL 212 (H) 05/07/2022   HDL 67 05/07/2022   LDLCALC 129 (H) 05/07/2022   TRIG 70 05/07/2022   CHOLHDL 3.2 05/07/2022   Last hemoglobin A1c Lab Results  Component Value Date   HGBA1C 5.9 (H) 04/08/2022   Last thyroid functions Lab Results  Component Value Date   TSH 0.82 10/30/2021   Last vitamin D Lab Results  Component Value Date   VD25OH 72 05/14/2021   Last vitamin B12 and Folate Lab Results  Component Value Date   VITAMINB12 264 04/25/2007   FOLATE 8.7 04/25/2007        Assessment & Plan:    Routine Health Maintenance and Physical Exam  Immunization History  Administered Date(s) Administered   Influenza,inj,Quad PF,6+ Mos 08/02/2016, 05/13/2017, 05/23/2018, 03/20/2019, 04/07/2020, 05/14/2021   PFIZER(Purple Top)SARS-COV-2 Vaccination 07/13/2019, 08/03/2019   PPD Test 12/19/2013, 02/14/2015   Zoster Recombinat (Shingrix) 03/20/2019    Health Maintenance  Topic Date Due   COVID-19 Vaccine (3 - 2023-24 season) 06/10/2022 (Originally 02/26/2022)   Zoster Vaccines- Shingrix (2 of 2) 07/09/2022 (Originally 05/15/2019)   INFLUENZA VACCINE  09/26/2022 (Originally 01/26/2022)   Hepatitis C Screening  06/28/2048 (Originally 08/03/1980)   HIV Screening  06/28/2048 (Originally 08/03/1977)    MAMMOGRAM  10/23/2022   COLONOSCOPY (Pts 45-45yr Insurance coverage will need to be confirmed)  03/20/2024   HPV VACCINES  Aged Out    Discussed health benefits of physical activity, and encouraged her to engage in regular exercise appropriate for her age and condition.  Problem List Items Addressed This Visit       Other   Obesity   Physical exam, annual - Primary    Patient here today for complete annual physical exam for work. She has had labs drawn in the last 1-2 months so will defer those. She is planning for bariatric surgery in the near future. She reports eating a heart healthy diet and exercising at home and at the gym almost daily. Health maintenance items up to date and she will obtain flu vaccine at local pharmacy as she has recently been sick.      Return in about 1 year (around 05/26/2023) for annual physical.     ARubie Maid FNP

## 2022-05-26 ENCOUNTER — Encounter: Payer: Self-pay | Admitting: Podiatry

## 2022-05-26 ENCOUNTER — Ambulatory Visit (INDEPENDENT_AMBULATORY_CARE_PROVIDER_SITE_OTHER): Payer: BC Managed Care – PPO | Admitting: Podiatry

## 2022-05-26 ENCOUNTER — Ambulatory Visit (INDEPENDENT_AMBULATORY_CARE_PROVIDER_SITE_OTHER): Payer: BC Managed Care – PPO

## 2022-05-26 DIAGNOSIS — M722 Plantar fascial fibromatosis: Secondary | ICD-10-CM | POA: Diagnosis not present

## 2022-05-26 DIAGNOSIS — M7672 Peroneal tendinitis, left leg: Secondary | ICD-10-CM | POA: Diagnosis not present

## 2022-05-27 NOTE — Progress Notes (Signed)
Subjective:   Patient ID: Rhonda [name], female   DOB: 61 y.o.   MRN: [id number]   HPI Patient states she is getting a lot of pain in the outside of her left foot for the last month.  She does not remember specific injury and states that it hard to walk with.  She states that she is due to get bariatric surgery and they did not want her to do a steroid injection close to the surgical time.  Patient is moderately obese does not smoke likes to be active   Review of Systems  All other systems reviewed and are negative.       Objective:  Physical Exam Vitals and nursing note reviewed.  Constitutional:      Appearance: She is well-developed.  Pulmonary:     Effort: Pulmonary effort is normal.  Musculoskeletal:        General: Normal range of motion.  Skin:    General: Skin is warm.  Neurological:     Mental Status: She is alert.     Neurovascular status intact muscle strength found to be adequate range of motion adequate.  Patient is found to have inflammation of the peroneal tendon left at the insertion with quite a bit of fluid buildup around the area and pain.  Patient has a lot of pain when she tries to move her foot or tries to invert and evert her foot and was found to have good digital perfusion well-oriented x 3     Assessment:  Peroneal tendinitis left with inflammation with patient who we cannot be aggressive with as far as steroid usage     Plan:  H&P and due to the discomfort and it and pain with moving her foot I went ahead today and I applied a air fracture walker which was fitted to her lower leg properly and found to give good support for the area.  I advised on aggressive ice topical anti-inflammatories and if symptoms do not start to improve may have to consider steroid injection to the area but I would want her to get permission before we would consider this  X-rays indicate that there is no indication of a fracture or arthritis around this area

## 2022-05-28 ENCOUNTER — Other Ambulatory Visit: Payer: Self-pay | Admitting: Family Medicine

## 2022-05-28 DIAGNOSIS — J3089 Other allergic rhinitis: Secondary | ICD-10-CM

## 2022-05-28 NOTE — Telephone Encounter (Signed)
Requested medication (s) are due for refill today: Yes  Requested medication (s) are on the active medication list: No  Last refill:  05/14/21  Future visit scheduled:   Notes to clinic:  See request. Not on medication list.    Requested Prescriptions  Pending Prescriptions Disp Refills   valACYclovir (VALTREX) 1000 MG tablet [Pharmacy Med Name: VALACYCLOVIR HCL 1 GRAM TABLET] 4 tablet 5    Sig: TAKE 2 TABLETS (2,000 MG TOTAL) BY MOUTH TWICE A DAY FOR 1 DAY     Antimicrobials:  Antiviral Agents - Anti-Herpetic Passed - 05/28/2022  2:32 PM      Passed - Valid encounter within last 12 months    Recent Outpatient Visits           7 months ago Pure hypercholesterolemia   [facility] Pickard, Priscille Heidelberg, MD   1 year ago Pure hypercholesterolemia   [facility] Pickard, Priscille Heidelberg, MD   1 year ago Essential hypertension, benign   [facility] Pickard, Priscille Heidelberg, MD   2 years ago General medical exam   [facility] Donita Brooks, MD   2 years ago Essential hypertension, benign   [facility] [place], Velna Hatchet, MD       Future Appointments             In 1 year Pickard, Priscille Heidelberg, MD [facility]            Signed Prescriptions Disp Refills   montelukast (SINGULAIR) 10 MG tablet 90 tablet 3    Sig: TAKE 1 TABLET BY MOUTH EVERYDAY AT BEDTIME     Pulmonology:  Leukotriene Inhibitors Passed - 05/28/2022  2:32 PM      Passed - Valid encounter within last 12 months    Recent Outpatient Visits           7 months ago Pure hypercholesterolemia   [facility] Pickard, Priscille Heidelberg, MD   1 year ago Pure hypercholesterolemia   [facility] Pickard, Priscille Heidelberg, MD   1 year ago Essential hypertension, benign   [facility] Pickard, Priscille Heidelberg, MD   2 years ago General medical exam   [facility] Donita Brooks, MD   2  years ago Essential hypertension, benign   [facility] [place], Velna Hatchet, MD       Future Appointments             In 1 year Pickard, Priscille Heidelberg, MD [facility]

## 2022-05-31 DIAGNOSIS — G4733 Obstructive sleep apnea (adult) (pediatric): Secondary | ICD-10-CM | POA: Diagnosis not present

## 2022-06-01 DIAGNOSIS — Z01818 Encounter for other preprocedural examination: Secondary | ICD-10-CM | POA: Diagnosis not present

## 2022-06-09 DIAGNOSIS — Z79899 Other long term (current) drug therapy: Secondary | ICD-10-CM | POA: Diagnosis not present

## 2022-06-09 DIAGNOSIS — Z6836 Body mass index (BMI) 36.0-36.9, adult: Secondary | ICD-10-CM | POA: Diagnosis not present

## 2022-06-09 DIAGNOSIS — I10 Essential (primary) hypertension: Secondary | ICD-10-CM | POA: Diagnosis not present

## 2022-06-09 DIAGNOSIS — Z888 Allergy status to other drugs, medicaments and biological substances status: Secondary | ICD-10-CM | POA: Diagnosis not present

## 2022-06-09 DIAGNOSIS — G4733 Obstructive sleep apnea (adult) (pediatric): Secondary | ICD-10-CM | POA: Diagnosis not present

## 2022-06-09 DIAGNOSIS — Z880 Allergy status to penicillin: Secondary | ICD-10-CM | POA: Diagnosis not present

## 2022-06-09 DIAGNOSIS — F32A Depression, unspecified: Secondary | ICD-10-CM | POA: Diagnosis not present

## 2022-06-09 DIAGNOSIS — E782 Mixed hyperlipidemia: Secondary | ICD-10-CM | POA: Diagnosis not present

## 2022-06-12 ENCOUNTER — Other Ambulatory Visit (HOSPITAL_COMMUNITY): Payer: Self-pay | Admitting: Psychiatry

## 2022-06-12 DIAGNOSIS — F321 Major depressive disorder, single episode, moderate: Secondary | ICD-10-CM

## 2022-06-23 DIAGNOSIS — E782 Mixed hyperlipidemia: Secondary | ICD-10-CM | POA: Diagnosis not present

## 2022-06-23 DIAGNOSIS — Z713 Dietary counseling and surveillance: Secondary | ICD-10-CM | POA: Diagnosis not present

## 2022-07-08 ENCOUNTER — Telehealth (HOSPITAL_COMMUNITY): Payer: Self-pay

## 2022-07-08 DIAGNOSIS — F321 Major depressive disorder, single episode, moderate: Secondary | ICD-10-CM

## 2022-07-08 NOTE — Telephone Encounter (Signed)
Patient called to report that she never got the Wellbutrin 450 mg because her insurance would not pay for it she continued to take the 300 mg she reported that she is feeling fine and would like to know if its ok to continue the 300mg  please advise

## 2022-07-09 DIAGNOSIS — Z9884 Bariatric surgery status: Secondary | ICD-10-CM | POA: Diagnosis not present

## 2022-07-09 DIAGNOSIS — K912 Postsurgical malabsorption, not elsewhere classified: Secondary | ICD-10-CM | POA: Insufficient documentation

## 2022-07-09 DIAGNOSIS — Z903 Acquired absence of stomach [part of]: Secondary | ICD-10-CM | POA: Diagnosis not present

## 2022-07-09 DIAGNOSIS — R1110 Vomiting, unspecified: Secondary | ICD-10-CM | POA: Diagnosis not present

## 2022-07-12 MED ORDER — BUPROPION HCL ER (XL) 300 MG PO TB24: 300.0000 mg | ORAL_TABLET | ORAL | 2 refills | Status: DC

## 2022-07-12 NOTE — Addendum Note (Signed)
Addended by: Charlette Caffey on: 07/12/2022 09:20 AM   Modules accepted: Orders

## 2022-07-12 NOTE — Telephone Encounter (Signed)
Spoke to patient she stated that she does need a refill of the Wellbutrin 300 mg

## 2022-07-19 DIAGNOSIS — K6389 Other specified diseases of intestine: Secondary | ICD-10-CM | POA: Diagnosis not present

## 2022-07-19 DIAGNOSIS — R1110 Vomiting, unspecified: Secondary | ICD-10-CM | POA: Diagnosis not present

## 2022-07-22 DIAGNOSIS — Z9884 Bariatric surgery status: Secondary | ICD-10-CM | POA: Diagnosis not present

## 2022-07-22 DIAGNOSIS — R112 Nausea with vomiting, unspecified: Secondary | ICD-10-CM | POA: Diagnosis not present

## 2022-07-22 DIAGNOSIS — Z903 Acquired absence of stomach [part of]: Secondary | ICD-10-CM | POA: Diagnosis not present

## 2022-07-22 DIAGNOSIS — K912 Postsurgical malabsorption, not elsewhere classified: Secondary | ICD-10-CM | POA: Diagnosis not present

## 2022-07-22 DIAGNOSIS — K5909 Other constipation: Secondary | ICD-10-CM | POA: Diagnosis not present

## 2022-07-22 DIAGNOSIS — F32A Depression, unspecified: Secondary | ICD-10-CM | POA: Diagnosis not present

## 2022-07-25 ENCOUNTER — Other Ambulatory Visit: Payer: Self-pay | Admitting: Family Medicine

## 2022-07-26 NOTE — Telephone Encounter (Signed)
Unable to refill per protocol, Rx request is too soon. Last refill 04/08/22 for 90 and 3 refills.  Requested Prescriptions  Pending Prescriptions Disp Refills   amLODipine (NORVASC) 5 MG tablet [Pharmacy Med Name: AMLODIPINE BESYLATE 5 MG TAB] 90 tablet 1    Sig: TAKE 1 TABLET (5 MG TOTAL) BY MOUTH DAILY.     Cardiovascular: Calcium Channel Blockers 2 Failed - 07/25/2022 12:10 AM      Failed - Valid encounter within last 6 months    Recent Outpatient Visits           8 months ago Pure hypercholesterolemia   [place] Pickard, Cammie Mcgee, MD   1 year ago Pure hypercholesterolemia   [place] [facility] [place], Cammie Mcgee, MD   2 years ago Essential hypertension, benign   [place] Pickard, Cammie Mcgee, MD   2 years ago General medical exam   [place] Susy Frizzle, MD   2 years ago Essential hypertension, benign   [place], Modena Nunnery, MD       Future Appointments             In 49 months Pickard, Cammie Mcgee, MD [facility]            Passed - Last BP in normal range    BP Readings from Last 1 Encounters:  05/25/22 115/70         Passed - Last Heart Rate in normal range    Pulse Readings from Last 1 Encounters:  05/25/22 99

## 2022-09-07 ENCOUNTER — Encounter (HOSPITAL_COMMUNITY): Payer: Self-pay | Admitting: Psychiatry

## 2022-09-07 ENCOUNTER — Telehealth (HOSPITAL_BASED_OUTPATIENT_CLINIC_OR_DEPARTMENT_OTHER): Payer: Medicaid Other | Admitting: Psychiatry

## 2022-09-07 DIAGNOSIS — F321 Major depressive disorder, single episode, moderate: Secondary | ICD-10-CM | POA: Diagnosis not present

## 2022-09-07 MED ORDER — DESVENLAFAXINE SUCCINATE ER 25 MG PO TB24: 25.0000 mg | ORAL_TABLET | Freq: Every day | ORAL | 1 refills | Status: DC

## 2022-09-07 NOTE — Progress Notes (Signed)
BH MD/PA/NP OP Progress Note  09/07/2022 10:59 AM Rhonda [name]  MRN:  [id number]  Visit Diagnosis:    ICD-10-CM   1. Depression, major, single episode, moderate (HCC)  F32.1       Assessment: Rhonda [name] is a 62 y.o. female with a history of depression, obstructive sleep apnea who presented to [facility] [facility] for initial evaluation on 04/15/22.    During initial evaluation patient reported symptoms of anxiety, increased irritability, increased fatigue, decreased concentration, low mood, and increased need to sleep.  Patient reported that her symptoms tend to be worse in the fall and winter.  She denied any suicidal ideation or thoughts of self-harm along with any history of psychosis, mania, paranoia, or delusions.  Of note patient has sleep apnea which is well-controlled with CPAP, and vitamin D deficiency which is well-controlled.  Patient's symptoms meet criteria for MDD at time of initial evaluation.  She declined therapy at that time but will consider again in the future.   Rhonda [name] presents for follow-up evaluation. Today, 09/07/22, patient reports a decline in her mood in anxiety symptoms over the past several months.  After undergoing a gastric bypass 3 months ago she has had increased nausea and difficulty keeping down her medications.  Over the past month she has only taken her medications a couple times and noticed increased restlessness, irritability, and nausea when she did take them at their former doses.  We discussed the need to restart the medication at a starting dose after being off it for that extended period of time.  We also discussed strategies to help minimize the nausea related to the medication.  Patient is going to meet with her doctor to discuss the nausea later this week as well.  We will restart Pristiq at 25 mg daily and hold Wellbutrin at this time.  We will follow-up in a month.  Plan: - Hold Wellbutrin XL 300 mg  QD - Decrease Pristiq 25 mg QD - Continue Vit D 10000 units daily managed by PCP - CMP, CBC, lipid profile, Vit D, TSH, A1c reviewd - Can consider therapy in the future if needed - Follow up in a month  Chief Complaint:  Chief Complaint  Patient presents with   Follow-up   HPI: Rhonda [name] presents reporting that she had surgery 3 months ago and lost 54 pounds leading to her feeling really good physically.  After the surgery she was taken off of her cholesterol and blood pressure medications as she does not need them at this time. She has also had some increased nausea which in turn has resulted in her being unable to keep the medications down. She has not been able to take the Pristiq or Wellbutrin with any consistency and has only taken them a few times over a month. During that time she took them at her former doses and noticed that she does not seem to be able to tolerate them anymore. One time she had increased irritability after taking the medication.  We discussed how these medications often have to be restarted at her initial dose when we have been off of them for an extended period of time and starting at the former doses can cause increased side effects.  Patient expresses her acknowledgment and is interested in restarting on medication as her anxiety/mood/irritability symptoms have been a bit more severe this past month or 2.  She notes that she has a short temper and can snap on people  which is atypical for her.  As she is still adjusting to what she can tolerate postsurgery we suggested only restarting the Pristiq at 25 mg for now.  We also encouraged her to try taking it with food as that can help with some related nausea.  Patient is taking over-the-counter antinausea medication and is meeting with her doctor later this week to discuss this as well.  Past Psychiatric History: Patient reports that her psychiatric care started in 1991 following her father's suicide.  She was diagnosed with  depression and seasonal affective disorder.  Patient was tried on a number of medications including Cymbalta, Paxil, Zoloft, Lexapro, Wellbutrin, and Pristiq.  She denies trying any mood stabilizers or antipsychotics.  Patient notes that she is prescribed gabapentin for pain and it made her feel a bit loopy.  Patient attended therapy after separating for her husband and reports it was helpful at the time.  She denies any SI/HI or thoughts of self-harm or any prior suicide attempts as well as any past psychiatric hospitalizations.  Patient denies any substance use.  Past Medical History:  Past Medical History:  Diagnosis Date   Arthritis    knees   Carpal tunnel syndrome of left wrist 07/2016   Depression    History of MRSA infection > 10 years   right leg   Hyperlipidemia    Hypertension    states under control with meds., has been on med. > 2 yr.   OSA (obstructive sleep apnea) 09/05/2019    Past Surgical History:  Procedure Laterality Date   ABDOMINAL HYSTERECTOMY     partial   BREAST EXCISIONAL BIOPSY     CARPAL TUNNEL RELEASE Left 08/26/2016   Procedure: LEFT WRIST CARPAL TUNNEL RELEASE;  Surgeon: Ninetta Lights, MD;  Location: [facility];  Service: Orthopedics;  Laterality: Left;   COLONOSCOPY WITH PROPOFOL  03/20/2014   LAPAROSCOPIC UNILATERAL SALPINGECTOMY Right 05/18/2002   OVARY BIOPSY Right 05/18/2002   TRIGGER FINGER RELEASE Left 02/10/2017   Procedure: LEFT TRIGGER THUMB RELEASE (TENDON SHEATH INCISION);  Surgeon: Ninetta Lights, MD;  Location: [facility];  Service: Orthopedics;  Laterality: Left;    Family Psychiatric History: Patient reports that her father had depression and committed suicide in 5.  Family History:  Family History  Problem Relation Age of Onset   Breast cancer Maternal Aunt     Social History:  Social History   Socioeconomic History   Marital status: Single    Spouse name: Not on file   Number of children:  Not on file   Years of education: Not on file   Highest education level: Not on file  Occupational History   Not on file  Tobacco Use   Smoking status: Never   Smokeless tobacco: Never  Substance and Sexual Activity   Alcohol use: No   Drug use: No   Sexual activity: Not on file  Other Topics Concern   Not on file  Social History Narrative   Not on file   Social Determinants of Health   Financial Resource Strain: Not on file  Food Insecurity: Not on file  Transportation Needs: Not on file  Physical Activity: Not on file  Stress: Not on file  Social Connections: Not on file    Allergies:  Allergies  Allergen Reactions   Penicillins Hives         Statins Other (See Comments)    MYALGIAS   Lisinopril Cough    Current Medications:  Current Outpatient Medications  Medication Sig Dispense Refill   amLODipine (NORVASC) 10 MG tablet Take 1 tablet (10 mg total) by mouth daily. 90 tablet 3   buPROPion (WELLBUTRIN XL) 300 MG 24 hr tablet Take 1 tablet (300 mg total) by mouth every morning. 30 tablet 2   Cholecalciferol (DIALYVITE VITAMIN D 5000) 125 MCG (5000 UT) capsule Take 2 capsules (10,000 Units total) by mouth daily.     desvenlafaxine (PRISTIQ) 50 MG 24 hr tablet TAKE 1 TABLET (50 MG TOTAL) BY MOUTH DAILY. STOP PAXIL AND CYMBALTA 90 tablet 2   ezetimibe (ZETIA) 10 MG tablet Take 1 tablet (10 mg total) by mouth daily. 90 tablet 1   fluticasone (FLONASE) 50 MCG/ACT nasal spray Place into both nostrils as needed.      hydrochlorothiazide (HYDRODIURIL) 25 MG tablet TAKE 1 TABLET (25 MG TOTAL) BY MOUTH DAILY. 90 tablet 0   mometasone (ELOCON) 0.1 % cream Apply topically daily. 15 g 1   montelukast (SINGULAIR) 10 MG tablet TAKE 1 TABLET BY MOUTH EVERYDAY AT BEDTIME 90 tablet 3   Multiple Vitamins-Iron (DAILY VITAMIN/IRON) TABS Chelate Iron     polyethylene glycol powder (GLYCOLAX/MIRALAX) 17 GM/SCOOP powder Take 17 g by mouth 2 (two) times daily as needed. 3350 g 1    progesterone (PROMETRIUM) 100 MG capsule Take by mouth at bedtime.     valACYclovir (VALTREX) 1000 MG tablet TAKE 2 TABLETS (2,000 MG TOTAL) BY MOUTH TWICE A DAY FOR 1 DAY 4 tablet 5   valsartan (DIOVAN) 320 MG tablet TAKE 1 TABLET BY MOUTH EVERY DAY 90 tablet 2   No current facility-administered medications for this visit.     Psychiatric Specialty Exam: Review of Systems  There were no vitals taken for this visit.There is no height or weight on file to calculate BMI.  General Appearance: Fairly Groomed  Eye Contact:  Good  Speech:  Clear and Coherent and Normal Rate  Volume:  Normal  Mood:  Anxious  Affect:  Congruent  Thought Process:  Coherent, Goal Directed, and Linear  Orientation:  Full (Time, Place, and Person)  Thought Content: Logical   Suicidal Thoughts:  No  Homicidal Thoughts:  No  Memory:  NA  Judgement:  Good  Insight:  Good  Psychomotor Activity:  Normal  Concentration:  Concentration: Good  Recall:  Good  Fund of Knowledge: Good  Language: Good  Akathisia:  NA    AIMS (if indicated): not done  Assets:  Communication Skills Desire for Improvement Housing Leisure Time [place] Talents/Skills Transportation Vocational/Educational  ADL's:  Intact  Cognition: WNL  Sleep:  Good   Metabolic Disorder Labs: Lab Results  Component Value Date   HGBA1C 5.9 (H) 04/08/2022   MPG 123 04/08/2022   MPG 114 08/06/2019   No results found for: "PROLACTIN" Lab Results  Component Value Date   CHOL 212 (H) 05/07/2022   TRIG 70 05/07/2022   HDL 67 05/07/2022   CHOLHDL 3.2 05/07/2022   VLDL 17 08/02/2016   LDLCALC 129 (H) 05/07/2022   LDLCALC 142 (H) 10/30/2021   Lab Results  Component Value Date   TSH 0.82 10/30/2021   TSH 0.63 03/20/2019    Therapeutic Level Labs: No results found for: "LITHIUM" No results found for: "VALPROATE" No results found for: "CBMZ"   Screenings: GAD-7    Flowsheet Row Office Visit from  05/25/2022 in [facility] Office Visit from 05/07/2022 in [facility]  Office Visit from 10/29/2019 in [facility] Office Visit from 08/06/2019 in [facility] Office Visit from 03/20/2019 in [facility]  Total GAD-7 Score 0 0 '9 21 12      '$ PHQ2-9    [place] Office Visit from 05/25/2022 in [place] Office Visit from 05/07/2022 in [facility] Office Visit from 04/15/2022 in [facility] Office Visit from 04/08/2022 in [facility] Office Visit from 05/14/2021 in [facility]  PHQ-2 Total Score 0 0 1 4 0  PHQ-9 Total Score 0 '1 5 15 '$ 0       Collaboration of Care: Collaboration of Care: Medication Management AEB medication prescription, Primary Care Provider AEB chart review, and Other provider involved in patient's care AEB bariatric chart review  Patient/Guardian was advised Release of Information must be obtained prior to any record release in order to collaborate their care with an outside provider. Patient/Guardian was advised if they have not already done so to contact the registration department to sign all necessary forms in order for [place] to release information regarding their care.   Consent: Patient/Guardian gives verbal consent for treatment and assignment of benefits for services provided during this visit. Patient/Guardian expressed understanding and agreed to proceed.    Vista Mink, MD 09/07/2022, 10:59 AM   Virtual Visit via Video Note  I connected with Marcene Corning on 09/07/22 at 11:00 AM EDT by a video enabled telemedicine application and verified that I am speaking with the correct person using two identifiers.  Location: Patient: Home Provider: Home Office   I discussed the limitations of evaluation and management by  telemedicine and the availability of in person appointments. The patient expressed understanding and agreed to proceed.   I discussed the assessment and treatment plan with the patient. The patient was provided an opportunity to ask questions and all were answered. The patient agreed with the plan and demonstrated an understanding of the instructions.   The patient was advised to call back or seek an in-person evaluation if the symptoms worsen or if the condition fails to improve as anticipated.  I provided 15 minutes of non-face-to-face time during this encounter.   Vista Mink, MD

## 2022-09-13 DIAGNOSIS — Z713 Dietary counseling and surveillance: Secondary | ICD-10-CM | POA: Diagnosis not present

## 2022-09-17 ENCOUNTER — Ambulatory Visit: Payer: Medicaid Other | Admitting: Family Medicine

## 2022-09-17 ENCOUNTER — Encounter: Payer: Self-pay | Admitting: Family Medicine

## 2022-09-17 VITALS — BP 160/90 | HR 70 | Temp 98.2°F | Ht 63.0 in | Wt 160.0 lb

## 2022-09-17 DIAGNOSIS — I10 Essential (primary) hypertension: Secondary | ICD-10-CM

## 2022-09-17 DIAGNOSIS — R002 Palpitations: Secondary | ICD-10-CM

## 2022-09-17 MED ORDER — VALSARTAN 160 MG PO TABS: 160.0000 mg | ORAL_TABLET | Freq: Every day | ORAL | 5 refills | Status: DC

## 2022-09-17 NOTE — Progress Notes (Signed)
Subjective:    Patient ID: Rhonda [name], female    DOB: 1961/05/01, 62 y.o.   MRN: [id number]  HPI  Patient is a 62 year old African-American female who presents today complaining of elevated blood pressures.  Her blood pressure is 0000000 systolic and consistently that high.  She was previously on hydrochlorothiazide, spironolactone, and amlodipine for hypertension.  However she had gastric bypass surgery and her surgeon took her off her blood pressure medication after the surgery.  However it appears that her blood pressure is risen back to a treatable level.  She also reports that she is under more stress and experiencing occasional palpitations.  She denies any chest pain or shortness of breath.  She does have vertigo when she stands up however this appears to be unrelated to her blood pressure Past Medical History:  Diagnosis Date   Arthritis    knees   Carpal tunnel syndrome of left wrist 07/2016   Depression    History of MRSA infection > 10 years   right leg   Hyperlipidemia    Hypertension    states under control with meds., has been on med. > 2 yr.   OSA (obstructive sleep apnea) 09/05/2019   Past Surgical History:  Procedure Laterality Date   ABDOMINAL HYSTERECTOMY     partial   BREAST EXCISIONAL BIOPSY     CARPAL TUNNEL RELEASE Left 08/26/2016   Procedure: LEFT WRIST CARPAL TUNNEL RELEASE;  Surgeon: Ninetta Lights, MD;  Location: [facility];  Service: Orthopedics;  Laterality: Left;   COLONOSCOPY WITH PROPOFOL  03/20/2014   LAPAROSCOPIC UNILATERAL SALPINGECTOMY Right 05/18/2002   OVARY BIOPSY Right 05/18/2002   TRIGGER FINGER RELEASE Left 02/10/2017   Procedure: LEFT TRIGGER THUMB RELEASE (TENDON SHEATH INCISION);  Surgeon: Ninetta Lights, MD;  Location: [facility];  Service: Orthopedics;  Laterality: Left;   Current Outpatient Medications on File Prior to Visit  Medication Sig Dispense Refill   Cholecalciferol (DIALYVITE VITAMIN D 5000)  125 MCG (5000 UT) capsule Take 2 capsules (10,000 Units total) by mouth daily.     desvenlafaxine (PRISTIQ) 25 MG 24 hr tablet Take 1 tablet (25 mg total) by mouth daily. Stop paxil and cymbalta 30 tablet 1   ezetimibe (ZETIA) 10 MG tablet Take 1 tablet (10 mg total) by mouth daily. 90 tablet 1   fluticasone (FLONASE) 50 MCG/ACT nasal spray Place into both nostrils as needed.      mometasone (ELOCON) 0.1 % cream Apply topically daily. 15 g 1   montelukast (SINGULAIR) 10 MG tablet TAKE 1 TABLET BY MOUTH EVERYDAY AT BEDTIME 90 tablet 3   Multiple Vitamins-Iron (DAILY VITAMIN/IRON) TABS Chelate Iron     No current facility-administered medications on file prior to visit.   Allergies  Allergen Reactions   Penicillins Hives         Statins Other (See Comments)    MYALGIAS   Lisinopril Cough   Social History   Socioeconomic History   Marital status: Single    Spouse name: Not on file   Number of children: Not on file   Years of education: Not on file   Highest education level: Associate degree: academic program  Occupational History   Not on file  Tobacco Use   Smoking status: Never   Smokeless tobacco: Never  Substance and Sexual Activity   Alcohol use: No   Drug use: No   Sexual activity: Not on file  Other Topics Concern   Not on  file  Social History Narrative   Not on file   Social Determinants of Health   Financial Resource Strain: Patient Declined (09/15/2022)   Overall Financial Resource Strain (CARDIA)    Difficulty of Paying Living Expenses: Patient declined  Food Insecurity: Patient Declined (09/15/2022)   Hunger Vital Sign    Worried About Running Out of Food in the Last Year: Patient declined    [place] in the Last Year: Patient declined  Transportation Needs: No Transportation Needs (09/15/2022)   PRAPARE - Hydrologist (Medical): No    Lack of Transportation (Non-Medical): No  Physical Activity: Insufficiently Active  (09/15/2022)   Exercise Vital Sign    Days of Exercise per Week: 3 days    Minutes of Exercise per Session: 30 min  Stress: No Stress Concern Present (09/15/2022)   [place]    Feeling of Stress : Only a little  Social Connections: Moderately Integrated (09/15/2022)   Social Connection and Isolation Panel [NHANES]    Frequency of Communication with Friends and Family: More than three times a week    Frequency of Social Gatherings with Friends and Family: Twice a week    Attends Religious Services: More than 4 times per year    Active Member of [employer] or Organizations: Yes    Attends Music therapist: More than 4 times per year    Marital Status: Divorced  Human resources officer Violence: Not on file   Family History  Problem Relation Age of Onset   Breast cancer Maternal Aunt      Review of Systems  All other systems reviewed and are negative.      Objective:   Physical Exam Vitals reviewed.  Constitutional:      General: She is not in acute distress.    Appearance: Normal appearance. She is obese. She is not ill-appearing, toxic-appearing or diaphoretic.  HENT:     Head: Normocephalic and atraumatic.  Neck:     Vascular: No carotid bruit.  Cardiovascular:     Rate and Rhythm: Normal rate and regular rhythm.     Pulses: Normal pulses.     Heart sounds: Normal heart sounds. No murmur heard.    No friction rub. No gallop.  Pulmonary:     Effort: Pulmonary effort is normal. No respiratory distress.     Breath sounds: Normal breath sounds. No stridor. No wheezing, rhonchi or rales.  Chest:     Chest wall: No tenderness.  Musculoskeletal:     Cervical back: Normal range of motion and neck supple. No rigidity or tenderness.  Lymphadenopathy:     Cervical: No cervical adenopathy.  Neurological:     General: No focal deficit present.     Mental Status: She is alert and oriented to person, place, and  time. Mental status is at baseline.     Cranial Nerves: No cranial nerve deficit.     Sensory: No sensory deficit.     Motor: No weakness.     Coordination: Coordination normal.     Gait: Gait normal.     Deep Tendon Reflexes: Reflexes normal.           Assessment & Plan:  Benign essential HTN - Plan: CBC with Differential/Platelet, COMPLETE METABOLIC PANEL WITH GFR, TSH  Palpitations - Plan: CBC with Differential/Platelet, COMPLETE METABOLIC PANEL WITH GFR, TSH I want to start her back on an antihypertensive.  I have recommended  that she start valsartan 160 mg daily.  Recheck blood pressure in 1 week.  If persistently elevated, we may add amlodipine or if she continues to have palpitations we may add a beta-blocker.  Given the palpitations I will also check a TSH along with a CBC and a CMP

## 2022-09-18 ENCOUNTER — Encounter: Payer: Self-pay | Admitting: Family Medicine

## 2022-09-20 LAB — COMPLETE METABOLIC PANEL WITH GFR
AG Ratio: 2 (calc) (ref 1.0–2.5)
ALT: 10 U/L (ref 6–29)
AST: 16 U/L (ref 10–35)
Albumin: 4.1 g/dL (ref 3.6–5.1)
Alkaline phosphatase (APISO): 58 U/L (ref 37–153)
BUN: 10 mg/dL (ref 7–25)
CO2: 29 mmol/L (ref 20–32)
Calcium: 9.5 mg/dL (ref 8.6–10.4)
Chloride: 103 mmol/L (ref 98–110)
Creat: 0.68 mg/dL (ref 0.50–1.05)
Globulin: 2.1 g/dL (calc) (ref 1.9–3.7)
Glucose, Bld: 91 mg/dL (ref 65–99)
Potassium: 3.7 mmol/L (ref 3.5–5.3)
Sodium: 142 mmol/L (ref 135–146)
Total Bilirubin: 0.4 mg/dL (ref 0.2–1.2)
Total Protein: 6.2 g/dL (ref 6.1–8.1)
eGFR: 100 mL/min/{1.73_m2} (ref >=60)

## 2022-09-20 LAB — CBC WITH DIFFERENTIAL/PLATELET
Absolute Monocytes: 357 cells/uL (ref 200–950)
Basophils Absolute: 28 cells/uL (ref 0–200)
Basophils Relative: 0.6 %
Eosinophils Absolute: 80 cells/uL (ref 15–500)
Eosinophils Relative: 1.7 %
HCT: 40.6 % (ref 35.0–45.0)
Hemoglobin: 13.1 g/dL (ref 11.7–15.5)
Lymphs Abs: 1927 cells/uL (ref 850–3900)
MCH: 31.3 pg (ref 27.0–33.0)
MCHC: 32.3 g/dL (ref 32.0–36.0)
MCV: 97.1 fL (ref 80.0–100.0)
MPV: 11 fL (ref 7.5–12.5)
Monocytes Relative: 7.6 %
Neutro Abs: 2308 cells/uL (ref 1500–7800)
Neutrophils Relative %: 49.1 %
Platelets: 231 10*3/uL (ref 140–400)
RBC: 4.18 10*6/uL (ref 3.80–5.10)
RDW: 12.8 % (ref 11.0–15.0)
Total Lymphocyte: 41 %
WBC: 4.7 10*3/uL (ref 3.8–10.8)

## 2022-09-20 LAB — TSH: TSH: 0.45 mIU/L (ref 0.40–4.50)

## 2022-09-30 ENCOUNTER — Other Ambulatory Visit (HOSPITAL_COMMUNITY): Payer: Self-pay | Admitting: Psychiatry

## 2022-09-30 DIAGNOSIS — F321 Major depressive disorder, single episode, moderate: Secondary | ICD-10-CM

## 2022-10-04 ENCOUNTER — Encounter: Payer: Self-pay | Admitting: Family Medicine

## 2022-10-05 ENCOUNTER — Other Ambulatory Visit (HOSPITAL_COMMUNITY): Payer: Self-pay | Admitting: Psychiatry

## 2022-10-05 DIAGNOSIS — F321 Major depressive disorder, single episode, moderate: Secondary | ICD-10-CM

## 2022-10-08 ENCOUNTER — Encounter (HOSPITAL_COMMUNITY): Payer: Self-pay | Admitting: Psychiatry

## 2022-10-08 ENCOUNTER — Telehealth (HOSPITAL_BASED_OUTPATIENT_CLINIC_OR_DEPARTMENT_OTHER): Payer: Medicaid Other | Admitting: Psychiatry

## 2022-10-08 DIAGNOSIS — F321 Major depressive disorder, single episode, moderate: Secondary | ICD-10-CM

## 2022-10-08 MED ORDER — DESVENLAFAXINE SUCCINATE ER 50 MG PO TB24: 50.0000 mg | ORAL_TABLET | Freq: Every day | ORAL | 2 refills | Status: DC

## 2022-10-08 NOTE — Progress Notes (Signed)
BH MD/PA/NP OP Progress Note  10/08/2022 11:10 AM Rhonda [name]  MRN:  [id number]  Visit Diagnosis:    ICD-10-CM   1. Depression, major, single episode, moderate  F32.1 desvenlafaxine (PRISTIQ) 50 MG 24 hr tablet      Assessment: Rhonda [name] is a 62 y.o. female with a history of depression, obstructive sleep apnea who presented to [facility] at [facility] for initial evaluation on 04/15/22.    During initial evaluation patient reported symptoms of anxiety, increased irritability, increased fatigue, decreased concentration, low mood, and increased need to sleep.  Patient reported that her symptoms tend to be worse in the fall and winter.  She denied any suicidal ideation or thoughts of self-harm along with any history of psychosis, mania, paranoia, or delusions.  Of note patient has sleep apnea which is well-controlled with CPAP, and vitamin D deficiency which is well-controlled.  Patient's symptoms meet criteria for MDD at time of initial evaluation.  She declined therapy at that time but will consider again in the future.   Rhonda [name] presents for follow-up evaluation. Today, 10/08/22, patient reports an improvement in her mood and anxiety since restarting Pristiq and taking it regularly.  She also notes that she has been recovering well from her gastric bypass and she is tolerating the medication well.  Patient has been more active outdoors and is exercising more regularly.  We will increase Pristiq to 50 mg daily today and reviewed the risk and benefits.  Plan: - Hold Wellbutrin XL 300 mg QD - Increase Pristiq to 50 mg QD - Continue Vit D 10000 units daily managed by PCP - CMP, CBC, lipid profile, Vit D, TSH, A1c reviewd - Can consider therapy in the future if needed - Follow up in a month  Chief Complaint:  Chief Complaint  Patient presents with   Follow-up   HPI: Rhonda [name] presents reporting that she has been getting better over the past month,  but she is not 100% yet.  She has been exercising and getting out to do work in the yard more often lately. There are still times where she can get a little more anxious or agitated but is less frequently and less severe than it had been previously.  She describes the anxiety as heart palpitations, restlessness, and irritability.  In regards to medication has been about a month since she has been taking the Pristiq daily and she is tolerating the medication much better now.  Patient describes that she is no longer experiencing the nausea and vomiting that she had previously.  We discussed increasing the Pristiq to 50 mg and reviewed the risk and benefits.  Patient was in agreement and will follow up in a month.  Past Psychiatric History: Patient reports that her psychiatric care started in 1991 following her father's suicide.  She was diagnosed with depression and seasonal affective disorder.  Patient was tried on a number of medications including Cymbalta, Paxil, Zoloft, Lexapro, Wellbutrin, and Pristiq.  She denies trying any mood stabilizers or antipsychotics.  Patient notes that she is prescribed gabapentin for pain and it made her feel a bit loopy.  Patient attended therapy after separating for her husband and reports it was helpful at the time.  She denies any SI/HI or thoughts of self-harm or any prior suicide attempts as well as any past psychiatric hospitalizations.  Patient denies any substance use.  Past Medical History:  Past Medical History:  Diagnosis Date   Arthritis  knees   Carpal tunnel syndrome of left wrist 07/2016   Depression    History of MRSA infection > 10 years   right leg   Hyperlipidemia    Hypertension    states under control with meds., has been on med. > 2 yr.   OSA (obstructive sleep apnea) 09/05/2019    Past Surgical History:  Procedure Laterality Date   ABDOMINAL HYSTERECTOMY     partial   BREAST EXCISIONAL BIOPSY     CARPAL TUNNEL RELEASE Left 08/26/2016    Procedure: LEFT WRIST CARPAL TUNNEL RELEASE;  Surgeon: Loreta Ave, MD;  Location: [facility];  Service: Orthopedics;  Laterality: Left;   COLONOSCOPY WITH PROPOFOL  03/20/2014   LAPAROSCOPIC UNILATERAL SALPINGECTOMY Right 05/18/2002   OVARY BIOPSY Right 05/18/2002   TRIGGER FINGER RELEASE Left 02/10/2017   Procedure: LEFT TRIGGER THUMB RELEASE (TENDON SHEATH INCISION);  Surgeon: Loreta Ave, MD;  Location: [facility];  Service: Orthopedics;  Laterality: Left;    Family Psychiatric History: Patient reports that her father had depression and committed suicide in 54.  Family History:  Family History  Problem Relation Age of Onset   Breast cancer Maternal Aunt     Social History:  Social History   Socioeconomic History   Marital status: Single    Spouse name: Not on file   Number of children: Not on file   Years of education: Not on file   Highest education level: Associate degree: academic program  Occupational History   Not on file  Tobacco Use   Smoking status: Never   Smokeless tobacco: Never  Substance and Sexual Activity   Alcohol use: No   Drug use: No   Sexual activity: Not on file  Other Topics Concern   Not on file  Social History Narrative   Not on file   Social Determinants of Health   Financial Resource Strain: Patient Declined (09/15/2022)   Overall Financial Resource Strain (CARDIA)    Difficulty of Paying Living Expenses: Patient declined  Food Insecurity: Patient Declined (09/15/2022)   Hunger Vital Sign    Worried About Running Out of Food in the Last Year: Patient declined    Ran Out of Food in the Last Year: Patient declined  Transportation Needs: No Transportation Needs (09/15/2022)   PRAPARE - Administrator, Civil Service (Medical): No    Lack of Transportation (Non-Medical): No  Physical Activity: Insufficiently Active (09/15/2022)   Exercise Vital Sign    Days of Exercise per Week: 3 days     Minutes of Exercise per Session: 30 min  Stress: No Stress Concern Present (09/15/2022)   [employer] of Occupational Health - Occupational Stress Questionnaire    Feeling of Stress : Only a little  Social Connections: Moderately Integrated (09/15/2022)   Social Connection and Isolation Panel [NHANES]    Frequency of Communication with Friends and Family: More than three times a week    Frequency of Social Gatherings with Friends and Family: Twice a week    Attends Religious Services: More than 4 times per year    Active Member of [employer] or Organizations: Yes    Attends Engineer, structural: More than 4 times per year    Marital Status: Divorced    Allergies:  Allergies  Allergen Reactions   Penicillins Hives         Statins Other (See Comments)    MYALGIAS   Lisinopril Cough    Current  Medications: Current Outpatient Medications  Medication Sig Dispense Refill   Cholecalciferol (DIALYVITE VITAMIN D 5000) 125 MCG (5000 UT) capsule Take 2 capsules (10,000 Units total) by mouth daily.     desvenlafaxine (PRISTIQ) 50 MG 24 hr tablet Take 1 tablet (50 mg total) by mouth daily. Stop paxil and cymbalta 30 tablet 2   ezetimibe (ZETIA) 10 MG tablet Take 1 tablet (10 mg total) by mouth daily. 90 tablet 1   fluticasone (FLONASE) 50 MCG/ACT nasal spray Place into both nostrils as needed.      mometasone (ELOCON) 0.1 % cream Apply topically daily. 15 g 1   montelukast (SINGULAIR) 10 MG tablet TAKE 1 TABLET BY MOUTH EVERYDAY AT BEDTIME 90 tablet 3   Multiple Vitamins-Iron (DAILY VITAMIN/IRON) TABS Chelate Iron     valsartan (DIOVAN) 160 MG tablet Take 1 tablet (160 mg total) by mouth daily. 30 tablet 5   No current facility-administered medications for this visit.     Psychiatric Specialty Exam: Review of Systems  There were no vitals taken for this visit.There is no height or weight on file to calculate BMI.  General Appearance: Fairly Groomed  Eye Contact:  Good   Speech:  Clear and Coherent and Normal Rate  Volume:  Normal  Mood:  Anxious and Euthymic  Affect:  Congruent  Thought Process:  Coherent, Goal Directed, and Linear  Orientation:  Full (Time, Place, and Person)  Thought Content: Logical   Suicidal Thoughts:  No  Homicidal Thoughts:  No  Memory:  NA  Judgement:  Good  Insight:  Good  Psychomotor Activity:  Normal  Concentration:  Concentration: Good  Recall:  Good  Fund of Knowledge: Good  Language: Good  Akathisia:  NA    AIMS (if indicated): not done  Assets:  Communication Skills Desire for Improvement Housing Leisure Time Physical Health Resilience Social Support Talents/Skills Transportation Vocational/Educational  ADL's:  Intact  Cognition: WNL  Sleep:  Good   Metabolic Disorder Labs: Lab Results  Component Value Date   HGBA1C 5.9 (H) 04/08/2022   MPG 123 04/08/2022   MPG 114 08/06/2019   No results found for: "PROLACTIN" Lab Results  Component Value Date   CHOL 212 (H) 05/07/2022   TRIG 70 05/07/2022   HDL 67 05/07/2022   CHOLHDL 3.2 05/07/2022   VLDL 17 08/02/2016   LDLCALC 129 (H) 05/07/2022   LDLCALC 142 (H) 10/30/2021   Lab Results  Component Value Date   TSH 0.45 09/17/2022   TSH 0.82 10/30/2021    Therapeutic Level Labs: No results found for: "LITHIUM" No results found for: "VALPROATE" No results found for: "CBMZ"   Screenings: GAD-7    Flowsheet Row Office Visit from 05/25/2022 in [facility] Office Visit from 05/07/2022 in [facility] Office Visit from 10/29/2019 in [facility] Office Visit from 08/06/2019 in [facility] Office Visit from 03/20/2019 in [facility]  Total GAD-7 Score 0 0 9 21 12       PHQ2-9    Flowsheet Row Office Visit from 05[facility]25/2022 in [facility] Office Visit from 05/07/2022 in [facility]  Visit from 04/15/2022 in [facility] Office Visit from 04/08/2022 in [facility] Office Visit from 05/14/2021 in [facility]  PHQ-2 Total Score 0 0 1 4 0  PHQ-9 Total Score 0 1 5 15  0  Collaboration of Care: Collaboration of Care: Medication Management AEB medication prescription and Primary Care Provider AEB chart review  Patient/Guardian was advised Release of Information must be obtained prior to any record release in order to collaborate their care with an outside provider. Patient/Guardian was advised if they have not already done so to contact the registration department to sign all necessary forms in order for [place] to release information regarding their care.   Consent: Patient/Guardian gives verbal consent for treatment and assignment of benefits for services provided during this visit. Patient/Guardian expressed understanding and agreed to proceed.    Stasia Cavalier, MD 10/08/2022, 11:10 AM   Virtual Visit via Video Note  I connected with Aquilla Solian on 10/08/22 at 11:00 AM EDT by a video enabled telemedicine application and verified that I am speaking with the correct person using two identifiers.  Location: Patient: Home Provider: Home Office   I discussed the limitations of evaluation and management by telemedicine and the availability of in person appointments. The patient expressed understanding and agreed to proceed.   I discussed the assessment and treatment plan with the patient. The patient was provided an opportunity to ask questions and all were answered. The patient agreed with the plan and demonstrated an understanding of the instructions.   The patient was advised to call back or seek an in-person evaluation if the symptoms worsen or if the condition fails to improve as anticipated.  I provided 15 minutes of non-face-to-face time during this encounter.   Stasia Cavalier,  MD

## 2022-10-15 DIAGNOSIS — K5909 Other constipation: Secondary | ICD-10-CM | POA: Diagnosis not present

## 2022-10-15 DIAGNOSIS — G4733 Obstructive sleep apnea (adult) (pediatric): Secondary | ICD-10-CM | POA: Diagnosis not present

## 2022-10-15 DIAGNOSIS — N952 Postmenopausal atrophic vaginitis: Secondary | ICD-10-CM | POA: Diagnosis not present

## 2022-10-15 DIAGNOSIS — I10 Essential (primary) hypertension: Secondary | ICD-10-CM | POA: Diagnosis not present

## 2022-10-15 DIAGNOSIS — F411 Generalized anxiety disorder: Secondary | ICD-10-CM | POA: Diagnosis not present

## 2022-10-15 DIAGNOSIS — Z9884 Bariatric surgery status: Secondary | ICD-10-CM | POA: Diagnosis not present

## 2022-10-15 DIAGNOSIS — E782 Mixed hyperlipidemia: Secondary | ICD-10-CM | POA: Diagnosis not present

## 2022-10-15 DIAGNOSIS — K912 Postsurgical malabsorption, not elsewhere classified: Secondary | ICD-10-CM | POA: Diagnosis not present

## 2022-10-15 DIAGNOSIS — Z903 Acquired absence of stomach [part of]: Secondary | ICD-10-CM | POA: Diagnosis not present

## 2022-10-15 DIAGNOSIS — G43909 Migraine, unspecified, not intractable, without status migrainosus: Secondary | ICD-10-CM | POA: Diagnosis not present

## 2022-11-09 ENCOUNTER — Telehealth (HOSPITAL_BASED_OUTPATIENT_CLINIC_OR_DEPARTMENT_OTHER): Payer: Medicaid Other | Admitting: Psychiatry

## 2022-11-09 ENCOUNTER — Encounter (HOSPITAL_COMMUNITY): Payer: Self-pay | Admitting: Psychiatry

## 2022-11-09 ENCOUNTER — Other Ambulatory Visit: Payer: Self-pay | Admitting: Family Medicine

## 2022-11-09 DIAGNOSIS — F321 Major depressive disorder, single episode, moderate: Secondary | ICD-10-CM | POA: Diagnosis not present

## 2022-11-09 DIAGNOSIS — F411 Generalized anxiety disorder: Secondary | ICD-10-CM

## 2022-11-09 MED ORDER — DESVENLAFAXINE SUCCINATE ER 50 MG PO TB24: 50.0000 mg | ORAL_TABLET | Freq: Every day | ORAL | 2 refills | Status: DC

## 2022-11-09 MED ORDER — DESVENLAFAXINE SUCCINATE ER 25 MG PO TB24
25.0000 mg | ORAL_TABLET | Freq: Every day | ORAL | 2 refills | Status: DC
Start: 2022-11-09 — End: 2022-12-10

## 2022-11-09 NOTE — Progress Notes (Signed)
BH MD/PA/NP OP Progress Note  11/09/2022 11:43 AM Rhonda [name]  MRN:  [id number]  Visit Diagnosis:    ICD-10-CM   1. Depression, major, single episode, moderate (HCC)  F32.1 desvenlafaxine (PRISTIQ) 50 MG 24 hr tablet    desvenlafaxine (PRISTIQ) 25 MG 24 hr tablet    2. GAD (generalized anxiety disorder)  F41.1 desvenlafaxine (PRISTIQ) 50 MG 24 hr tablet    desvenlafaxine (PRISTIQ) 25 MG 24 hr tablet      Assessment: Rhonda [name] is a 62 y.o. female with a history of depression, obstructive sleep apnea who presented to [facility] at [facility] for initial evaluation on 04/15/22.    During initial evaluation patient reported symptoms of anxiety, increased irritability, increased fatigue, decreased concentration, low mood, and increased need to sleep.  Patient reported that her symptoms tend to be worse in the fall and winter.  She denied any suicidal ideation or thoughts of self-harm along with any history of psychosis, mania, paranoia, or delusions.  Of note patient has sleep apnea which is well-controlled with CPAP, and vitamin D deficiency which is well-controlled.  Patient's symptoms meet criteria for MDD at time of initial evaluation.  Following patient's gastric bypass she has been unable to tolerate medications and was noted to be experiencing increased symptoms of anxiety including excessive worry, restlessness, increased irritability, and feeling overwhelmed.  With the symptoms she had criteria for generalized anxiety disorder.  Rhonda [name] presents for follow-up evaluation. Today, 11/09/22, patient reports continued improvement in her depressed mood and anxiety since increasing Pristiq to 50 mg.  Patient notes that the depressive symptoms are well controlled at this point though she does still struggle with some symptoms of anxiety including increased irritability and feelings of being overwhelmed.  She has tolerated Pristiq 50 mg well and denies  any adverse side effects.  We will continue to titrate to 75 mg today and reviewed the risk and benefits.  We also discussed the importance of self-care techniques to help manage anxiety symptoms.  Plan: - Hold Wellbutrin XL 300 mg QD - Increase Pristiq to 75 mg QD - Continue Vit D 10000 units daily managed by PCP - CMP, CBC, lipid profile, Vit D, TSH, A1c reviewd - Can consider therapy in the future if needed - Follow up in a month  Chief Complaint:  Chief Complaint  Patient presents with   Follow-up   HPI: Rhonda [name] presents reporting that she has been doing better in terms of her depression over the past month.  Her anxiety has also improved to the point where she feels calmer and not as worried about things.  There are however times she can still experience irritability and feelings of being overwhelmed which had not occurred when she was stable on medication prior to her procedure.  Patient does note that there have been some increased psychosocial stressors as she is trying to sell their house currently.  Rhonda [name] does feel that the Pristiq has been helpful and denies any notable adverse side effects.  She is still getting nauseous but feels that it is more likely related to the procedure.  She believes she is still getting used to how she is supposed to be eating.  We discussed treatment options and patient was open to further titration of the Pristiq.  We also talked about self-care and patient notes that she is hopeful to take a trip to the beach after selling the house.  She plans to go by herself and just  take time to unwind.  Past Psychiatric History: Patient reports that her psychiatric care started in 1991 following her father's suicide.  She was diagnosed with depression and seasonal affective disorder.  Patient was tried on a number of medications including Cymbalta, Paxil, Zoloft, Lexapro, Wellbutrin, and Pristiq.  She denies trying any mood stabilizers or antipsychotics.  Patient notes  that she is prescribed gabapentin for pain and it made her feel a bit loopy.  Patient attended therapy after separating for her husband and reports it was helpful at the time.  She denies any SI/HI or thoughts of self-harm or any prior suicide attempts as well as any past psychiatric hospitalizations.  Patient denies any substance use.  Past Medical History:  Past Medical History:  Diagnosis Date   Arthritis    knees   Carpal tunnel syndrome of left wrist 07/2016   Depression    History of MRSA infection > 10 years   right leg   Hyperlipidemia    Hypertension    states under control with meds., has been on med. > 2 yr.   OSA (obstructive sleep apnea) 09/05/2019    Past Surgical History:  Procedure Laterality Date   ABDOMINAL HYSTERECTOMY     partial   BREAST EXCISIONAL BIOPSY     CARPAL TUNNEL RELEASE Left 08/26/2016   Procedure: LEFT WRIST CARPAL TUNNEL RELEASE;  Surgeon: Loreta Ave, MD;  Location: [facility];  Service: Orthopedics;  Laterality: Left;   COLONOSCOPY WITH PROPOFOL  03/20/2014   LAPAROSCOPIC UNILATERAL SALPINGECTOMY Right 05/18/2002   OVARY BIOPSY Right 05/18/2002   TRIGGER FINGER RELEASE Left 02/10/2017   Procedure: LEFT TRIGGER THUMB RELEASE (TENDON SHEATH INCISION);  Surgeon: Loreta Ave, MD;  Location:  SURGERY CENTER;  Service: Orthopedics;  Laterality: Left;    Family Psychiatric History: Patient reports that her father had depression and committed suicide in 6.  Family History:  Family History  Problem Relation Age of Onset   Breast cancer Maternal Aunt     Social History:  Social History   Socioeconomic History   Marital status: Single    Spouse name: Not on file   Number of children: Not on file   Years of education: Not on file   Highest education level: Associate degree: academic program  Occupational History   Not on file  Tobacco Use   Smoking status: Never   Smokeless tobacco: Never  Substance and  Sexual Activity   Alcohol use: No   Drug use: No   Sexual activity: Not on file  Other Topics Concern   Not on file  Social History Narrative   Not on file   Social Determinants of Health   Financial Resource Strain: Patient Declined (09/15/2022)   Overall Financial Resource Strain (CARDIA)    Difficulty of Paying Living Expenses: Patient declined  Food Insecurity: Patient Declined (09/15/2022)   Hunger Vital Sign    Worried About Running Out of Food in the Last Year: Patient declined    Ran Out of Food in the Last Year: Patient declined  Transportation Needs: No Transportation Needs (09/15/2022)   PRAPARE - Administrator, Civil Service (Medical): No    Lack of Transportation (Non-Medical): No  Physical Activity: Insufficiently Active (09/15/2022)   Exercise Vital Sign    Days of Exercise per Week: 3 days    Minutes of Exercise per Session: 30 min  Stress: No Stress Concern Present (09/15/2022)   [employer] of Occupational Health -  Occupational Stress Questionnaire    Feeling of Stress : Only a little  Social Connections: Moderately Integrated (09/15/2022)   Social Connection and Isolation Panel [NHANES]    Frequency of Communication with Friends and Family: More than three times a week    Frequency of Social Gatherings with Friends and Family: Twice a week    Attends Religious Services: More than 4 times per year    Active Member of [employer] or Organizations: Yes    Attends Engineer, structural: More than 4 times per year    Marital Status: Divorced    Allergies:  Allergies  Allergen Reactions   Penicillins Hives         Statins Other (See Comments)    MYALGIAS   Lisinopril Cough    Current Medications: Current Outpatient Medications  Medication Sig Dispense Refill   desvenlafaxine (PRISTIQ) 25 MG 24 hr tablet Take 1 tablet (25 mg total) by mouth daily. Take with a 50 mg tab for a total of 75 mg daily 30 tablet 2   Cholecalciferol (DIALYVITE  VITAMIN D 5000) 125 MCG (5000 UT) capsule Take 2 capsules (10,000 Units total) by mouth daily.     desvenlafaxine (PRISTIQ) 50 MG 24 hr tablet Take 1 tablet (50 mg total) by mouth daily. Take with a 25 mg tab for a total of 75 mg daily 30 tablet 2   ezetimibe (ZETIA) 10 MG tablet Take 1 tablet (10 mg total) by mouth daily. 90 tablet 1   fluticasone (FLONASE) 50 MCG/ACT nasal spray Place into both nostrils as needed.      mometasone (ELOCON) 0.1 % cream Apply topically daily. 15 g 1   montelukast (SINGULAIR) 10 MG tablet TAKE 1 TABLET BY MOUTH EVERYDAY AT BEDTIME 90 tablet 3   Multiple Vitamins-Iron (DAILY VITAMIN/IRON) TABS Chelate Iron     valsartan (DIOVAN) 160 MG tablet Take 1 tablet (160 mg total) by mouth daily. 30 tablet 5   No current facility-administered medications for this visit.     Psychiatric Specialty Exam: Review of Systems  There were no vitals taken for this visit.There is no height or weight on file to calculate BMI.  General Appearance: Fairly Groomed  Eye Contact:  Good  Speech:  Clear and Coherent and Normal Rate  Volume:  Normal  Mood:  Anxious and Euthymic  Affect:  Congruent  Thought Process:  Coherent, Goal Directed, and Linear  Orientation:  Full (Time, Place, and Person)  Thought Content: Logical   Suicidal Thoughts:  No  Homicidal Thoughts:  No  Memory:  NA  Judgement:  Good  Insight:  Good  Psychomotor Activity:  Normal  Concentration:  Concentration: Good  Recall:  Good  Fund of Knowledge: Good  Language: Good  Akathisia:  NA    AIMS (if indicated): not done  Assets:  Communication Skills Desire for Improvement Housing Leisure Time Physical Health Resilience Social Support Talents/Skills Transportation Vocational/Educational  ADL's:  Intact  Cognition: WNL  Sleep:  Good   Metabolic Disorder Labs: Lab Results  Component Value Date   HGBA1C 5.9 (H) 04/08/2022   MPG 123 04/08/2022   MPG 114 08/06/2019   No results found for:  "PROLACTIN" Lab Results  Component Value Date   CHOL 212 (H) 05/07/2022   TRIG 70 05/07/2022   HDL 67 05/07/2022   CHOLHDL 3.2 05/07/2022   VLDL 17 08/02/2016   LDLCALC 129 (H) 05/07/2022   LDLCALC 142 (H) 10/30/2021   Lab Results  Component Value Date  TSH 0.45 09/17/2022   TSH 0.82 10/30/2021    Therapeutic Level Labs: No results found for: "LITHIUM" No results found for: "VALPROATE" No results found for: "CBMZ"   Screenings: GAD-7    Flowsheet Row Office Visit from 05/25/2022 in [facility] Office Visit from 05/07/2022 in [facility] Office Visit from 10/29/2019 in [facility] Office Visit from 08/06/2019 in [facility] Office Visit from 03/20/2019 in [facility]  Total GAD-7 Score 0 0 9 21 12       PHQ2-9    Flowsheet Row Office Visit from 05/25/2022 in [facility] Office Visit from 05/07/2022 in [facility] Office Visit from 04/15/2022 in [facility] Office Visit from 04/08/2022 in [facility] Office Visit from 05/14/2021 in [facility]  PHQ-2 Total Score 0 0 1 4 0  PHQ-9 Total Score 0 1 5 15  0       Collaboration of Care: Collaboration of Care: Medication Management AEB medication prescription  Patient/Guardian was advised Release of Information must be obtained prior to any record release in order to collaborate their care with an outside provider. Patient/Guardian was advised if they have not already done so to contact the registration department to sign all necessary forms in order for [place] to release information regarding their care.   Consent: Patient/Guardian gives verbal consent for treatment and assignment of benefits for services provided during this visit. Patient/Guardian expressed understanding and agreed to  proceed.    Stasia Cavalier, MD 11/09/2022, 11:43 AM   Virtual Visit via Video Note  I connected with Aquilla Solian on 11/09/22 at 11:30 AM EDT by a video enabled telemedicine application and verified that I am speaking with the correct person using two identifiers.  Location: Patient: Home Provider: Home Office   I discussed the limitations of evaluation and management by telemedicine and the availability of in person appointments. The patient expressed understanding and agreed to proceed.   I discussed the assessment and treatment plan with the patient. The patient was provided an opportunity to ask questions and all were answered. The patient agreed with the plan and demonstrated an understanding of the instructions.   The patient was advised to call back or seek an in-person evaluation if the symptoms worsen or if the condition fails to improve as anticipated.  I provided 10 minutes of non-face-to-face time during this encounter.   Stasia Cavalier, MD

## 2022-11-09 NOTE — Telephone Encounter (Signed)
Unable to refill per protocol, last refill by another provider 11/09/22.  Requested Prescriptions  Pending Prescriptions Disp Refills   desvenlafaxine (PRISTIQ) 50 MG 24 hr tablet [Pharmacy Med Name: DESVENLAFAXINE SUCCNT ER 50 MG] 90 tablet 2    Sig: TAKE 1 TABLET (50 MG TOTAL) BY MOUTH DAILY. STOP PAXIL AND CYMBALTA     Psychiatry: Antidepressants - SNRI - desvenlafaxine & venlafaxine Failed - 11/09/2022 12:07 PM      Failed - Last BP in normal range    BP Readings from Last 1 Encounters:  09/17/22 (!) 160/90         Failed - Valid encounter within last 6 months    Recent Outpatient Visits           1 year ago Pure hypercholesterolemia   [facility] Pickard, Priscille Heidelberg, MD   1 year ago Pure hypercholesterolemia   [facility] Tanya Nones, Priscille Heidelberg, MD   2 years ago Essential hypertension, benign   [facility] Pickard, Priscille Heidelberg, MD   2 years ago General medical exam   [facility] Donita Brooks, MD   3 years ago Essential hypertension, benign   [facility] [place], Velna Hatchet, MD       Future Appointments             In 6 months Pickard, Priscille Heidelberg, MD [facility]            Failed - Lipid Panel in normal range within the last 12 months    Cholesterol  Date Value Ref Range Status  05/07/2022 212 (H) <200 mg/dL Final   LDL Cholesterol (Calc)  Date Value Ref Range Status  05/07/2022 129 (H) mg/dL (calc) Final    Comment:    Reference range: <100 . Desirable range <100 mg/dL for primary prevention;   <70 mg/dL for patients with CHD or diabetic patients  with > or = 2 CHD risk factors. Marland Kitchen LDL-C is now calculated using the Martin-Hopkins  calculation, which is a validated novel method providing  better accuracy than the Friedewald equation in the  estimation of LDL-C.  Horald Pollen et al. Lenox Ahr. 1610;960(45): 2061-2068   (http://education.[facility].com/faq/FAQ164)    HDL  Date Value Ref Range Status  05/07/2022 67 > OR = 50 mg/dL Final   Triglycerides  Date Value Ref Range Status  05/07/2022 70 <150 mg/dL Final         Passed - Cr in normal range and within 360 days    Creat  Date Value Ref Range Status  09/17/2022 0.68 0.50 - 1.05 mg/dL Final         Passed - Completed PHQ-2 or PHQ-9 in the last 360 days

## 2022-11-11 ENCOUNTER — Encounter: Payer: Self-pay | Admitting: Podiatry

## 2022-11-11 ENCOUNTER — Ambulatory Visit: Payer: Medicaid Other | Admitting: Podiatry

## 2022-11-11 DIAGNOSIS — L600 Ingrowing nail: Secondary | ICD-10-CM | POA: Diagnosis not present

## 2022-11-11 DIAGNOSIS — S90212A Contusion of left great toe with damage to nail, initial encounter: Secondary | ICD-10-CM | POA: Diagnosis not present

## 2022-11-11 NOTE — Progress Notes (Signed)
Subjective:   Patient ID: Rhonda [name], female   DOB: 62 y.o.   MRN: [id number]   HPI Patient presents stating she split her left hallux wanted to have it checked it has been painful she is wearing open toed shoes   ROS      Objective:  Physical Exam  Neuro vascular status intact with an incurvated damaged distal one third of the left hallux nail bed localized with no erythema edema or drainage associated with it     Assessment:  Ingrown toenail deformity left hallux of the distal two thirds of the bed with contusion of the bed itself     Plan:  Gentle debridement discussed may require removal in future but would like to avoid that we will begin soaks and Neosporin usage and reappoint as symptoms indicate

## 2022-12-02 ENCOUNTER — Other Ambulatory Visit (HOSPITAL_COMMUNITY): Payer: Self-pay | Admitting: Psychiatry

## 2022-12-02 DIAGNOSIS — F411 Generalized anxiety disorder: Secondary | ICD-10-CM

## 2022-12-02 DIAGNOSIS — F321 Major depressive disorder, single episode, moderate: Secondary | ICD-10-CM

## 2022-12-09 DIAGNOSIS — I10 Essential (primary) hypertension: Secondary | ICD-10-CM | POA: Diagnosis not present

## 2022-12-09 DIAGNOSIS — E782 Mixed hyperlipidemia: Secondary | ICD-10-CM | POA: Diagnosis not present

## 2022-12-09 DIAGNOSIS — K912 Postsurgical malabsorption, not elsewhere classified: Secondary | ICD-10-CM | POA: Diagnosis not present

## 2022-12-09 DIAGNOSIS — Z9884 Bariatric surgery status: Secondary | ICD-10-CM | POA: Diagnosis not present

## 2022-12-09 DIAGNOSIS — Z903 Acquired absence of stomach [part of]: Secondary | ICD-10-CM | POA: Diagnosis not present

## 2022-12-10 ENCOUNTER — Encounter (HOSPITAL_COMMUNITY): Payer: Self-pay | Admitting: Psychiatry

## 2022-12-10 ENCOUNTER — Telehealth (HOSPITAL_BASED_OUTPATIENT_CLINIC_OR_DEPARTMENT_OTHER): Payer: Medicaid Other | Admitting: Psychiatry

## 2022-12-10 DIAGNOSIS — F321 Major depressive disorder, single episode, moderate: Secondary | ICD-10-CM | POA: Diagnosis not present

## 2022-12-10 DIAGNOSIS — F411 Generalized anxiety disorder: Secondary | ICD-10-CM | POA: Diagnosis not present

## 2022-12-10 MED ORDER — DESVENLAFAXINE SUCCINATE ER 50 MG PO TB24: 50.0000 mg | ORAL_TABLET | Freq: Every day | ORAL | 0 refills | Status: DC

## 2022-12-10 MED ORDER — DESVENLAFAXINE SUCCINATE ER 25 MG PO TB24: 25.0000 mg | ORAL_TABLET | Freq: Every day | ORAL | 0 refills | Status: DC

## 2022-12-10 NOTE — Progress Notes (Signed)
BH MD/PA/NP OP Progress Note  12/10/2022 9:14 AM Rhonda [name]  MRN:  [id number]  Visit Diagnosis:  No diagnosis found.   Assessment: Rhonda [name] is a 62 y.o. female with a history of depression, obstructive sleep apnea who presented to [facility] for initial evaluation on 04/15/22.    During initial evaluation patient reported symptoms of anxiety, increased irritability, increased fatigue, decreased concentration, low mood, and increased need to sleep.  Patient reported that her symptoms tend to be worse in the fall and winter.  She denied any suicidal ideation or thoughts of self-harm along with any history of psychosis, mania, paranoia, or delusions.  Of note patient has sleep apnea which is well-controlled with CPAP, and vitamin D deficiency which is well-controlled.  Patient's symptoms meet criteria for MDD at time of initial evaluation.  Following patient's gastric bypass she has been unable to tolerate medications and was noted to be experiencing increased symptoms of anxiety including excessive worry, restlessness, increased irritability, and feeling overwhelmed.  With the symptoms she had criteria for generalized anxiety disorder.  Rhonda [name] presents for follow-up evaluation. Today, 12/10/22, patient reports that her anxiety and irritability have improved since increasing Pristiq to 75 mg.  She denies any feelings of being overwhelmed.  Patient also denies any adverse side effects from the medication.  At this point we will continue on for sleep and it does not appear like there is a need to restart the Wellbutrin.  Patient will follow up in 6 to 8 weeks.  Plan: - Discontinue Wellbutrin XL 300 mg QD - Continue Pristiq 75 mg QD - Continue Vit D 10000 units daily managed by PCP - CMP, CBC, lipid profile, Vit D, TSH, A1c reviewd - Can consider therapy in the future if needed - Follow up in 2 months  Chief Complaint:  Chief  Complaint  Patient presents with   Follow-up   HPI: Rhonda [name] presents reporting that the last month is going really well for her.  She feels like the increase in Pristiq has really helped and denies any adverse side effects.  The irritability she had been experiencing previously has not occurred this past month.  She also notes that she is no longer feeling overwhelmed than she had previously.  Of note patient does spit up occasionally the met with the bariatric surgeon and discussed with them.  She believes that the spit out episodes are related to eating too fast.  When she focuses on slowing down her eating the staff does not occur.  Patient believes this apartment is getting used to her body after the procedure.  Past Psychiatric History: Patient reports that her psychiatric care started in 1991 following her father's suicide.  She was diagnosed with depression and seasonal affective disorder.  Patient was tried on a number of medications including Cymbalta, Paxil, Zoloft, Lexapro, Wellbutrin, and Pristiq.  She denies trying any mood stabilizers or antipsychotics.  Patient notes that she is prescribed gabapentin for pain and it made her feel a bit loopy.  Patient attended therapy after separating for her husband and reports it was helpful at the time.  She denies any SI/HI or thoughts of self-harm or any prior suicide attempts as well as any past psychiatric hospitalizations.  Patient denies any substance use.  Past Medical History:  Past Medical History:  Diagnosis Date   Arthritis    knees   Carpal tunnel syndrome of left wrist 07/2016   Depression  History of MRSA infection > 10 years   right leg   Hyperlipidemia    Hypertension    states under control with meds., has been on med. > 2 yr.   OSA (obstructive sleep apnea) 09/05/2019    Past Surgical History:  Procedure Laterality Date   ABDOMINAL HYSTERECTOMY     partial   BREAST EXCISIONAL BIOPSY     CARPAL TUNNEL RELEASE Left  08/26/2016   Procedure: LEFT WRIST CARPAL TUNNEL RELEASE;  Surgeon: Loreta Ave, MD;  Location: [facility];  Service: Orthopedics;  Laterality: Left;   COLONOSCOPY WITH PROPOFOL  03/20/2014   LAPAROSCOPIC UNILATERAL SALPINGECTOMY Right 05/18/2002   OVARY BIOPSY Right 05/18/2002   TRIGGER FINGER RELEASE Left 02/10/2017   Procedure: LEFT TRIGGER THUMB RELEASE (TENDON SHEATH INCISION);  Surgeon: Loreta Ave, MD;  Location:  SURGERY CENTER;  Service: Orthopedics;  Laterality: Left;    Family Psychiatric History: Patient reports that her father had depression and committed suicide in 77.  Family History:  Family History  Problem Relation Age of Onset   Breast cancer Maternal Aunt     Social History:  Social History   Socioeconomic History   Marital status: Single    Spouse name: Not on file   Number of children: Not on file   Years of education: Not on file   Highest education level: Associate degree: academic program  Occupational History   Not on file  Tobacco Use   Smoking status: Never   Smokeless tobacco: Never  Substance and Sexual Activity   Alcohol use: No   Drug use: No   Sexual activity: Not on file  Other Topics Concern   Not on file  Social History Narrative   Not on file   Social Determinants of Health   Financial Resource Strain: Patient Declined (09/15/2022)   Overall Financial Resource Strain (CARDIA)    Difficulty of Paying Living Expenses: Patient declined  Food Insecurity: Patient Declined (09/15/2022)   Hunger Vital Sign    Worried About Running Out of Food in the Last Year: Patient declined    Ran Out of Food in the Last Year: Patient declined  Transportation Needs: No Transportation Needs (09/15/2022)   PRAPARE - Administrator, Civil Service (Medical): No    Lack of Transportation (Non-Medical): No  Physical Activity: Insufficiently Active (09/15/2022)   Exercise Vital Sign    Days of Exercise per Week: 3  days    Minutes of Exercise per Session: 30 min  Stress: No Stress Concern Present (09/15/2022)   [employer] of Occupational Health - Occupational Stress Questionnaire    Feeling of Stress : Only a little  Social Connections: Moderately Integrated (09/15/2022)   Social Connection and Isolation Panel [NHANES]    Frequency of Communication with Friends and Family: More than three times a week    Frequency of Social Gatherings with Friends and Family: Twice a week    Attends Religious Services: More than 4 times per year    Active Member of [employer] or Organizations: Yes    Attends Engineer, structural: More than 4 times per year    Marital Status: Divorced    Allergies:  Allergies  Allergen Reactions   Penicillins Hives         Statins Other (See Comments)    MYALGIAS   Lisinopril Cough    Current Medications: Current Outpatient Medications  Medication Sig Dispense Refill   Cholecalciferol (DIALYVITE VITAMIN D 5000)  125 MCG (5000 UT) capsule Take 2 capsules (10,000 Units total) by mouth daily.     desvenlafaxine (PRISTIQ) 25 MG 24 hr tablet Take 1 tablet (25 mg total) by mouth daily. Take with a 50 mg tab for a total of 75 mg daily 30 tablet 2   desvenlafaxine (PRISTIQ) 50 MG 24 hr tablet Take 1 tablet (50 mg total) by mouth daily. Take with a 25 mg tab for a total of 75 mg daily 30 tablet 2   ezetimibe (ZETIA) 10 MG tablet Take 1 tablet (10 mg total) by mouth daily. 90 tablet 1   fluticasone (FLONASE) 50 MCG/ACT nasal spray Place into both nostrils as needed.      mometasone (ELOCON) 0.1 % cream Apply topically daily. 15 g 1   montelukast (SINGULAIR) 10 MG tablet TAKE 1 TABLET BY MOUTH EVERYDAY AT BEDTIME 90 tablet 3   Multiple Vitamins-Iron (DAILY VITAMIN/IRON) TABS Chelate Iron     valsartan (DIOVAN) 160 MG tablet Take 1 tablet (160 mg total) by mouth daily. 30 tablet 5   No current facility-administered medications for this visit.     Psychiatric Specialty  Exam: Review of Systems  There were no vitals taken for this visit.There is no height or weight on file to calculate BMI.  General Appearance: Fairly Groomed  Eye Contact:  Good  Speech:  Clear and Coherent and Normal Rate  Volume:  Normal  Mood:  Euthymic  Affect:  Congruent  Thought Process:  Coherent, Goal Directed, and Linear  Orientation:  Full (Time, Place, and Person)  Thought Content: Logical   Suicidal Thoughts:  No  Homicidal Thoughts:  No  Memory:  NA  Judgement:  Good  Insight:  Good  Psychomotor Activity:  Normal  Concentration:  Concentration: Good  Recall:  Good  Fund of Knowledge: Good  Language: Good  Akathisia:  NA    AIMS (if indicated): not done  Assets:  Communication Skills Desire for Improvement Housing Leisure Time Physical Health Resilience Social Support Talents/Skills Transportation Vocational/Educational  ADL's:  Intact  Cognition: WNL  Sleep:  Good   Metabolic Disorder Labs: Lab Results  Component Value Date   HGBA1C 5.9 (H) 04/08/2022   MPG 123 04/08/2022   MPG 114 08/06/2019   No results found for: "PROLACTIN" Lab Results  Component Value Date   CHOL 212 (H) 05/07/2022   TRIG 70 05/07/2022   HDL 67 05/07/2022   CHOLHDL 3.2 05/07/2022   VLDL 17 08/02/2016   LDLCALC 129 (H) 05/07/2022   LDLCALC 142 (H) 10/30/2021   Lab Results  Component Value Date   TSH 0.45 09/17/2022   TSH 0.82 10/30/2021    Therapeutic Level Labs: No results found for: "LITHIUM" No results found for: "VALPROATE" No results found for: "CBMZ"   Screenings: GAD-7    Flowsheet Row Office Visit from 05/25/2022 in [facility] [place] Office Visit from 05/07/2022 in [facility] Office Visit from 10/29/2019 in [facility] Office Visit from 08/06/2019 in [facility] Office Visit from 03/20/2019 in [facility]  Total GAD-7 Score 0 0 9 21 12       PHQ2-9     Flowsheet Row Office Visit from 05/25/2022 in [facility] [place] [facility] Office Visit from 05/07/2022 in [facility] Office Visit from 04/15/2022 in [facility] Office Visit from 04/08/2022 in [facility] [place] [facility] Office Visit  from 05/14/2021 in [facility]  PHQ-2 Total Score 0 0 1 4 0  PHQ-9 Total Score 0 1 5 15  0       Collaboration of Care: Collaboration of Care: Medication Management AEB medication prescription  Patient/Guardian was advised Release of Information must be obtained prior to any record release in order to collaborate their care with an outside provider. Patient/Guardian was advised if they have not already done so to contact the registration department to sign all necessary forms in order for [place] to release information regarding their care.   Consent: Patient/Guardian gives verbal consent for treatment and assignment of benefits for services provided during this visit. Patient/Guardian expressed understanding and agreed to proceed.    Stasia Cavalier, MD 12/10/2022, 9:14 AM   Virtual Visit via Video Note  I connected with Rhonda [name] on 12/10/22 at 11:00 AM EDT by a video enabled telemedicine application and verified that I am speaking with the correct person using two identifiers.  Location: Patient: Home Provider: Home Office   I discussed the limitations of evaluation and management by telemedicine and the availability of in person appointments. The patient expressed understanding and agreed to proceed.   I discussed the assessment and treatment plan with the patient. The patient was provided an opportunity to ask questions and all were answered. The patient agreed with the plan and demonstrated an understanding of the instructions.   The patient was advised to call back or seek an in-person evaluation if the symptoms worsen or if the condition  fails to improve as anticipated.  I provided 10 minutes of non-face-to-face time during this encounter.   Stasia Cavalier, MD

## 2022-12-22 ENCOUNTER — Other Ambulatory Visit: Payer: Self-pay | Admitting: Family Medicine

## 2022-12-22 NOTE — Telephone Encounter (Signed)
Med was dc'd 09/07/22 by Dr. Everlena Cooper Dc' d by provider (dose change)  Requested Prescriptions  Refused Prescriptions Disp Refills   valsartan (DIOVAN) 320 MG tablet [Pharmacy Med Name: VALSARTAN 320 MG TABLET] 90 tablet 2    Sig: TAKE 1 TABLET BY MOUTH EVERY DAY     Cardiovascular:  Angiotensin Receptor Blockers Failed - 12/22/2022 12:19 AM      Failed - Last BP in normal range    BP Readings from Last 1 Encounters:  09/17/22 (!) 160/90         Failed - Valid encounter within last 6 months    Recent Outpatient Visits           1 year ago Pure hypercholesterolemia   [facility] Pickard, Priscille Heidelberg, MD   1 year ago Pure hypercholesterolemia   [facility] Tanya Nones, Priscille Heidelberg, MD   2 years ago Essential hypertension, benign   [facility] Pickard, Priscille Heidelberg, MD   2 years ago General medical exam   [facility] Donita Brooks, MD   3 years ago Essential hypertension, benign   [facility], Velna Hatchet, MD       Future Appointments             In 5 months Pickard, Priscille Heidelberg, MD [facility]            Passed - Cr in normal range and within 180 days    Creat  Date Value Ref Range Status  09/17/2022 0.68 0.50 - 1.05 mg/dL Final         Passed - K in normal range and within 180 days    Potassium  Date Value Ref Range Status  09/17/2022 3.7 3.5 - 5.3 mmol/L Final         Passed - Patient is not pregnant

## 2022-12-28 ENCOUNTER — Telehealth: Payer: Self-pay

## 2022-12-28 NOTE — Telephone Encounter (Signed)
Chart review completed for patient. Patient is due for screening mammogram. Mychart message sent to patient to inquire about scheduling mammogram.  Naomii Kreger, Population Health Specialist.  

## 2022-12-31 DIAGNOSIS — M5440 Lumbago with sciatica, unspecified side: Secondary | ICD-10-CM | POA: Diagnosis not present

## 2023-01-27 NOTE — Progress Notes (Signed)
BH MD/PA/NP OP Progress Note  01/28/2023 11:09 AM Rhonda [name]  MRN:  [id number]  Visit Diagnosis:    ICD-10-CM   1. GAD (generalized anxiety disorder)  F41.1 desvenlafaxine (PRISTIQ) 50 MG 24 hr tablet    desvenlafaxine (PRISTIQ) 25 MG 24 hr tablet    2. Depression, major, single episode, moderate (HCC)  F32.1 desvenlafaxine (PRISTIQ) 50 MG 24 hr tablet    desvenlafaxine (PRISTIQ) 25 MG 24 hr tablet       Assessment: Rhonda [name] is a 62 y.o. female with a history of depression, obstructive sleep apnea who presented to [facility] at [facility] for initial evaluation on 04/15/22.    During initial evaluation patient reported symptoms of anxiety, increased irritability, increased fatigue, decreased concentration, low mood, and increased need to sleep.  Patient reported that her symptoms tend to be worse in the fall and winter.  She denied any suicidal ideation or thoughts of self-harm along with any history of psychosis, mania, paranoia, or delusions.  Of note patient has sleep apnea which is well-controlled with CPAP, and vitamin D deficiency which is well-controlled.  Patient's symptoms meet criteria for MDD at time of initial evaluation.  Following patient's gastric bypass she has been unable to tolerate medications and was noted to be experiencing increased symptoms of anxiety including excessive worry, restlessness, increased irritability, and feeling overwhelmed.  With the symptoms she had criteria for generalized anxiety disorder.  Rhonda [name] presents for follow-up evaluation. Today, 01/28/23, patient reports that her depression and anxiety have remained stable on the current medication regimen.  There are days where she can occasionally feel slightly depressed however she is able to manage issues with her coping mechanisms.  We will continue on her current regimen follow-up in 3 months.  Plan: - Continue Pristiq 75 mg QD - Continue Vit D 10000  units daily managed by PCP - CMP, CBC, lipid profile, Vit D, TSH, A1c reviewd - Can consider therapy in the future if needed - Follow up in 3 months  Chief Complaint:  Chief Complaint  Patient presents with   Follow-up   HPI: Rhonda [name] presents reporting that everything has been good in the interim.  Her mood has remained stable along with her anxiety.  Occasionally she can have days where there is a lot going on and does not believe enough hours in the day which can make her feel more stressed and a little down, however she is able to tackle things at 1 time and does not get overwhelmed.  Support was provided around this and discussion patient agreed that she can manage on her current regimen without needing to increase her Pristiq or restart Wellbutrin.  Outside of that patient is still feeling some nausea post bariatric procedure that is learning to cope with it.  She denies any other concerns at this time.  Past Psychiatric History: Patient reports that her psychiatric care started in 1991 following her father's suicide.  She was diagnosed with depression and seasonal affective disorder.  Patient was tried on a number of medications including Cymbalta, Paxil, Zoloft, Lexapro, Wellbutrin XL 300 mg (worked well in the past with Pristiq), and Pristiq.  She denies trying any mood stabilizers or antipsychotics.  Patient notes that she is prescribed gabapentin for pain and it made her feel a bit loopy.  Patient attended therapy after separating for her husband and reports it was helpful at the time.  She denies any SI/HI or thoughts of self-harm or any  prior suicide attempts as well as any past psychiatric hospitalizations.  Patient denies any substance use.  Past Medical History:  Past Medical History:  Diagnosis Date   Arthritis    knees   Carpal tunnel syndrome of left wrist 07/2016   Depression    History of MRSA infection > 10 years   right leg   Hyperlipidemia    Hypertension    states  under control with meds., has been on med. > 2 yr.   OSA (obstructive sleep apnea) 09/05/2019    Past Surgical History:  Procedure Laterality Date   ABDOMINAL HYSTERECTOMY     partial   BREAST EXCISIONAL BIOPSY     CARPAL TUNNEL RELEASE Left 08/26/2016   Procedure: LEFT WRIST CARPAL TUNNEL RELEASE;  Surgeon: Loreta Ave, MD;  Location: [facility];  Service: Orthopedics;  Laterality: Left;   COLONOSCOPY WITH PROPOFOL  03/20/2014   LAPAROSCOPIC UNILATERAL SALPINGECTOMY Right 05/18/2002   OVARY BIOPSY Right 05/18/2002   TRIGGER FINGER RELEASE Left 02/10/2017   Procedure: LEFT TRIGGER THUMB RELEASE (TENDON SHEATH INCISION);  Surgeon: Loreta Ave, MD;  Location: [facility];  Service: Orthopedics;  Laterality: Left;    Family Psychiatric History: Patient reports that her father had depression and committed suicide in 1.  Family History:  Family History  Problem Relation Age of Onset   Breast cancer Maternal Aunt     Social History:  Social History   Socioeconomic History   Marital status: Single    Spouse name: Not on file   Number of children: Not on file   Years of education: Not on file   Highest education level: Associate degree: academic program  Occupational History   Not on file  Tobacco Use   Smoking status: Never   Smokeless tobacco: Never  Substance and Sexual Activity   Alcohol use: No   Drug use: No   Sexual activity: Not on file  Other Topics Concern   Not on file  Social History Narrative   Not on file   Social Determinants of Health   Financial Resource Strain: Low Risk  (12/06/2022)   Received from [facility], [facility]   Overall Financial Resource Strain (CARDIA)    Difficulty of Paying Living Expenses: Not hard at all  Food Insecurity: No Food Insecurity (12/06/2022)   Received from [facility], [facility]   Hunger Vital Sign    Worried About Running Out of Food in the Last Year: Never true    Ran  Out of Food in the Last Year: Never true  Transportation Needs: No Transportation Needs (12/06/2022)   Received from [employer], [facility]   PRAPARE - Transportation    Lack of Transportation (Medical): No    Lack of Transportation (Non-Medical): No  Physical Activity: Sufficiently Active (12/06/2022)   Received from [facility], [facility]   Exercise Vital Sign    Days of Exercise per Week: 3 days    Minutes of Exercise per Session: 60 min  Recent Concern: Physical Activity - Insufficiently Active (09/15/2022)   Exercise Vital Sign    Days of Exercise per Week: 3 days    Minutes of Exercise per Session: 30 min  Stress: No Stress Concern Present (12/06/2022)   Received from [facility], [facility] - Occupational Stress Questionnaire    Feeling of Stress : Not at all  Social Connections: Socially Integrated (12/06/2022)   Received from [facility],  [facility]   Social Network    How would you rate your social network (family, work, friends)?: Good participation with social networks    Allergies:  Allergies  Allergen Reactions   Penicillins Hives         Statins Other (See Comments)    MYALGIAS   Lisinopril Cough    Current Medications: Current Outpatient Medications  Medication Sig Dispense Refill   Cholecalciferol (DIALYVITE VITAMIN D 5000) 125 MCG (5000 UT) capsule Take 2 capsules (10,000 Units total) by mouth daily.     desvenlafaxine (PRISTIQ) 25 MG 24 hr tablet Take 1 tablet (25 mg total) by mouth daily. Take with a 50 mg tab for a total of 75 mg daily 90 tablet 0   desvenlafaxine (PRISTIQ) 50 MG 24 hr tablet Take 1 tablet (50 mg total) by mouth daily. Take with a 25 mg tab for a total of 75 mg daily 90 tablet 0   ezetimibe (ZETIA) 10 MG tablet Take 1 tablet (10 mg total) by mouth daily. 90 tablet 1   fluticasone (FLONASE) 50 MCG/ACT nasal spray Place into both nostrils as needed.      mometasone (ELOCON) 0.1  % cream Apply topically daily. 15 g 1   montelukast (SINGULAIR) 10 MG tablet TAKE 1 TABLET BY MOUTH EVERYDAY AT BEDTIME 90 tablet 3   Multiple Vitamins-Iron (DAILY VITAMIN/IRON) TABS Chelate Iron     valsartan (DIOVAN) 160 MG tablet Take 1 tablet (160 mg total) by mouth daily. 30 tablet 5   No current facility-administered medications for this visit.     Psychiatric Specialty Exam: Review of Systems  There were no vitals taken for this visit.There is no height or weight on file to calculate BMI.  General Appearance: Fairly Groomed  Eye Contact:  Good  Speech:  Clear and Coherent and Normal Rate  Volume:  Normal  Mood:  Euthymic  Affect:  Congruent  Thought Process:  Coherent, Goal Directed, and Linear  Orientation:  Full (Time, Place, and Person)  Thought Content: Logical   Suicidal Thoughts:  No  Homicidal Thoughts:  No  Memory:  NA  Judgement:  Good  Insight:  Good  Psychomotor Activity:  Normal  Concentration:  Concentration: Good  Recall:  Good  Fund of Knowledge: Good  Language: Good  Akathisia:  NA    AIMS (if indicated): not done  Assets:  Communication Skills Desire for Improvement Housing Leisure Time Physical Health Resilience Social Support Talents/Skills Transportation Vocational/Educational  ADL's:  Intact  Cognition: WNL  Sleep:  Good   Metabolic Disorder Labs: Lab Results  Component Value Date   HGBA1C 5.9 (H) 04/08/2022   MPG 123 04/08/2022   MPG 114 08/06/2019   No results found for: "PROLACTIN" Lab Results  Component Value Date   CHOL 212 (H) 05/07/2022   TRIG 70 05/07/2022   HDL 67 05/07/2022   CHOLHDL 3.2 05/07/2022   VLDL 17 08/02/2016   LDLCALC 129 (H) 05/07/2022   LDLCALC 142 (H) 10/30/2021   Lab Results  Component Value Date   TSH 0.45 09/17/2022   TSH 0.82 10/30/2021    Therapeutic Level Labs: No results found for: "LITHIUM" No results found for: "VALPROATE" No results found for: "CBMZ"   Screenings: GAD-7     Flowsheet Row Office Visit from 05/25/2022 in [facility] Office Visit from 05/07/2022 in [facility] Office Visit from 10/29/2019 in [facility] Office Visit from 08/06/2019 in [facility] Office Visit from 03/20/2019 in [facility]  Total GAD-7 Score 0 0 9 21 12       PHQ2-9    Flowsheet Row Office Visit from 05/25/2022 in [facility] Office Visit from 05/07/2022 in [facility] Office Visit from 04/15/2022 in [facility] Office Visit from 04/08/2022 in [facility] Office Visit from 05/14/2021 in [facility]  PHQ-2 Total Score 0 0 1 4 0  PHQ-9 Total Score 0 1 5 15  0       Collaboration of Care: Collaboration of Care: Medication Management AEB medication prescription  Patient/Guardian was advised Release of Information must be obtained prior to any record release in order to collaborate their care with an outside provider. Patient/Guardian was advised if they have not already done so to contact the registration department to sign all necessary forms in order for [place] to release information regarding their care.   Consent: Patient/Guardian gives verbal consent for treatment and assignment of benefits for services provided during this visit. Patient/Guardian expressed understanding and agreed to proceed.    Stasia Cavalier, MD 01/28/2023, 11:09 AM   Virtual Visit via Video Note  I connected with Aquilla Solian on 01/28/23 at 11:00 AM EDT by a video enabled telemedicine application and verified that I am speaking with the correct person using two identifiers.  Location: Patient: Home Provider: Home Office   I discussed the limitations of evaluation and management by telemedicine and the availability of in person appointments. The patient expressed  understanding and agreed to proceed.   I discussed the assessment and treatment plan with the patient. The patient was provided an opportunity to ask questions and all were answered. The patient agreed with the plan and demonstrated an understanding of the instructions.   The patient was advised to call back or seek an in-person evaluation if the symptoms worsen or if the condition fails to improve as anticipated.  I provided 10 minutes of non-face-to-face time during this encounter.   Stasia Cavalier, MD

## 2023-01-28 ENCOUNTER — Encounter (HOSPITAL_COMMUNITY): Payer: Self-pay | Admitting: Psychiatry

## 2023-01-28 ENCOUNTER — Telehealth (HOSPITAL_BASED_OUTPATIENT_CLINIC_OR_DEPARTMENT_OTHER): Payer: Medicaid Other | Admitting: Psychiatry

## 2023-01-28 ENCOUNTER — Other Ambulatory Visit: Payer: Self-pay | Admitting: Family Medicine

## 2023-01-28 DIAGNOSIS — F321 Major depressive disorder, single episode, moderate: Secondary | ICD-10-CM | POA: Diagnosis not present

## 2023-01-28 DIAGNOSIS — F411 Generalized anxiety disorder: Secondary | ICD-10-CM | POA: Diagnosis not present

## 2023-01-28 DIAGNOSIS — Z1231 Encounter for screening mammogram for malignant neoplasm of breast: Secondary | ICD-10-CM

## 2023-01-28 MED ORDER — DESVENLAFAXINE SUCCINATE ER 50 MG PO TB24: 50.0000 mg | ORAL_TABLET | Freq: Every day | ORAL | 0 refills | Status: DC

## 2023-01-28 MED ORDER — DESVENLAFAXINE SUCCINATE ER 25 MG PO TB24: 25.0000 mg | ORAL_TABLET | Freq: Every day | ORAL | 0 refills | Status: DC

## 2023-02-09 ENCOUNTER — Ambulatory Visit
Admission: RE | Admit: 2023-02-09 | Discharge: 2023-02-09 | Disposition: A | Discharge status: Home / Self Care | Payer: Medicaid Other | Source: Ambulatory Visit | Attending: Family Medicine | Admitting: Family Medicine

## 2023-02-09 DIAGNOSIS — Z1231 Encounter for screening mammogram for malignant neoplasm of breast: Secondary | ICD-10-CM | POA: Diagnosis not present

## 2023-02-24 DIAGNOSIS — R9082 White matter disease, unspecified: Secondary | ICD-10-CM | POA: Diagnosis not present

## 2023-02-24 DIAGNOSIS — G44319 Acute post-traumatic headache, not intractable: Secondary | ICD-10-CM | POA: Diagnosis not present

## 2023-02-24 DIAGNOSIS — Z888 Allergy status to other drugs, medicaments and biological substances status: Secondary | ICD-10-CM | POA: Diagnosis not present

## 2023-02-24 DIAGNOSIS — Z79899 Other long term (current) drug therapy: Secondary | ICD-10-CM | POA: Diagnosis not present

## 2023-02-24 DIAGNOSIS — R519 Headache, unspecified: Secondary | ICD-10-CM | POA: Diagnosis not present

## 2023-02-24 DIAGNOSIS — E785 Hyperlipidemia, unspecified: Secondary | ICD-10-CM | POA: Diagnosis not present

## 2023-02-24 DIAGNOSIS — Z880 Allergy status to penicillin: Secondary | ICD-10-CM | POA: Diagnosis not present

## 2023-02-24 DIAGNOSIS — I10 Essential (primary) hypertension: Secondary | ICD-10-CM | POA: Diagnosis not present

## 2023-02-24 DIAGNOSIS — H539 Unspecified visual disturbance: Secondary | ICD-10-CM | POA: Diagnosis not present

## 2023-02-24 DIAGNOSIS — S0083XA Contusion of other part of head, initial encounter: Secondary | ICD-10-CM | POA: Diagnosis not present

## 2023-03-03 DIAGNOSIS — H02842 Edema of right lower eyelid: Secondary | ICD-10-CM | POA: Diagnosis not present

## 2023-03-04 ENCOUNTER — Encounter: Payer: Self-pay | Admitting: Pharmacist

## 2023-03-08 ENCOUNTER — Ambulatory Visit: Payer: Medicaid Other | Admitting: Family Medicine

## 2023-03-08 VITALS — BP 132/88 | HR 73 | Temp 98.5°F | Ht 63.0 in | Wt 139.6 lb

## 2023-03-08 DIAGNOSIS — M316 Other giant cell arteritis: Secondary | ICD-10-CM

## 2023-03-08 MED ORDER — PREDNISONE 20 MG PO TABS: 60.0000 mg | ORAL_TABLET | Freq: Every day | ORAL | 1 refills | Status: DC

## 2023-03-08 NOTE — Progress Notes (Signed)
Subjective:    Patient ID: Rhonda [name], female    DOB: [birth year], 62 y.o.   MRN: [id number]  HPI Patient is a 62 year old African-American female with a history of migraines.  She saw her eye doctor a few months ago and her vision in her right eye was 20/20.  Recently she has been having pain and tenderness over her right temple directly over the temporal artery.  She also has had vision loss.  She reports squirms in her vision.  She reports blurry spots in her vision.  This seems to be located only in the center of her vision.  Her periphery is spared.  The loss seems to come and go.  However she is constantly tender and sore to the touch over the right temple.  She saw a provider at an urgent care who was concerned about temporal arteritis and referred her to an emergency room.  In the emergency room they were concerned about migraine.  Per the patient she has not had blood work to check a sed rate.  She had imaging of the brain that was normal per her report.  She presents today with tenderness pain over her right temple and central vision loss in her right eye. Past Medical History:  Diagnosis Date   Arthritis    knees   Carpal tunnel syndrome of left wrist 07/2016   Depression    History of MRSA infection > 10 years   right leg   Hyperlipidemia    Hypertension    states under control with meds., has been on med. > 2 yr.   OSA (obstructive sleep apnea) 09/05/2019   Past Surgical History:  Procedure Laterality Date   ABDOMINAL HYSTERECTOMY     partial   BREAST EXCISIONAL BIOPSY     CARPAL TUNNEL RELEASE Left 08/26/2016   Procedure: LEFT WRIST CARPAL TUNNEL RELEASE;  Surgeon: Loreta Ave, MD;  Location: [facility];  Service: Orthopedics;  Laterality: Left;   COLONOSCOPY WITH PROPOFOL  03/20/2014   LAPAROSCOPIC UNILATERAL SALPINGECTOMY Right 05/18/2002   OVARY BIOPSY Right 05/18/2002   TRIGGER FINGER RELEASE Left 02/10/2017   Procedure: LEFT TRIGGER THUMB  RELEASE (TENDON SHEATH INCISION);  Surgeon: Loreta Ave, MD;  Location: [facility];  Service: Orthopedics;  Laterality: Left;   Current Outpatient Medications on File Prior to Visit  Medication Sig Dispense Refill   Cholecalciferol (DIALYVITE VITAMIN D 5000) 125 MCG (5000 UT) capsule Take 2 capsules (10,000 Units total) by mouth daily.     desvenlafaxine (PRISTIQ) 25 MG 24 hr tablet Take 1 tablet (25 mg total) by mouth daily. Take with a 50 mg tab for a total of 75 mg daily 90 tablet 0   desvenlafaxine (PRISTIQ) 50 MG 24 hr tablet Take 1 tablet (50 mg total) by mouth daily. Take with a 25 mg tab for a total of 75 mg daily 90 tablet 0   ezetimibe (ZETIA) 10 MG tablet Take 1 tablet (10 mg total) by mouth daily. 90 tablet 1   fluticasone (FLONASE) 50 MCG/ACT nasal spray Place into both nostrils as needed.      mometasone (ELOCON) 0.1 % cream Apply topically daily. 15 g 1   montelukast (SINGULAIR) 10 MG tablet TAKE 1 TABLET BY MOUTH EVERYDAY AT BEDTIME 90 tablet 3   Multiple Vitamins-Iron (DAILY VITAMIN/IRON) TABS Chelate Iron     valsartan (DIOVAN) 160 MG tablet Take 1 tablet (160 mg total) by mouth daily. 30 tablet 5  No current facility-administered medications on file prior to visit.   Allergies  Allergen Reactions   Penicillins Hives         Statins Other (See Comments)    MYALGIAS   Lisinopril Cough   Social History   Socioeconomic History   Marital status: Single    Spouse name: Not on file   Number of children: Not on file   Years of education: Not on file   Highest education level: Associate degree: academic program  Occupational History   Not on file  Tobacco Use   Smoking status: Never   Smokeless tobacco: Never  Substance and Sexual Activity   Alcohol use: No   Drug use: No   Sexual activity: Not on file  Other Topics Concern   Not on file  Social History Narrative   Not on file   Social Determinants of Health   Financial Resource Strain:  Low Risk  (12/06/2022)   Received from [facility], [facility]   Overall Financial Resource Strain (CARDIA)    Difficulty of Paying Living Expenses: Not hard at all  Food Insecurity: No Food Insecurity (12/06/2022)   Received [facility], [facility]   Hunger Vital Sign    Worried About Running Out of Food in the Last Year: Never true    Ran Out of Food in the Last Year: Never true  Transportation Needs: No Transportation Needs (12/06/2022)   Received from [employer], [facility]   PRAPARE - Transportation    Lack of Transportation (Medical): No    Lack of Transportation (Non-Medical): No  Physical Activity: Sufficiently Active (12/06/2022)   Received from [facility], [facility]   Exercise Vital Sign    Days of Exercise per Week: 3 days    Minutes of Exercise per Session: 60 min  Recent Concern: Physical Activity - Insufficiently Active (09/15/2022)   Exercise Vital Sign    Days of Exercise per Week: 3 days    Minutes of Exercise per Session: 30 min  Stress: No Stress Concern Present (12/06/2022)   Received from [facility], [facility] of Occupational Health - Occupational Stress Questionnaire    Feeling of Stress : Not at all  Social Connections: Socially Integrated (12/06/2022)   [facility] from [facility], [facility]   Social Network    How would you rate your social network (family, work, friends)?: Good participation with social networks  Intimate Partner Violence: Not At Risk (02/24/2023)   Received from [facility]   HITS    Over the last 12 months how often did your partner physically hurt you?: 1    Over the last 12 months how often did your partner insult you or talk down to you?: 1    Over the last 12 months how often did your partner threaten you with physical harm?: 1    Over the last 12 months how often did your partner scream or curse at you?: 1   Family History  Problem Relation Age of Onset   Breast cancer  Maternal Aunt      Review of Systems  All other systems reviewed and are negative.      Objective:   Physical Exam Vitals reviewed.  Constitutional:      General: She is not in acute distress.    Appearance: Normal appearance. She is obese. She is not ill-appearing, toxic-appearing or diaphoretic.  HENT:     Head: Normocephalic and atraumatic.   Eyes:  General: Lids are normal.     Extraocular Movements: Extraocular movements intact.     Conjunctiva/sclera: Conjunctivae normal.  Neck:     Vascular: No carotid bruit.  Cardiovascular:     Rate and Rhythm: Normal rate and regular rhythm.     Pulses: Normal pulses.     Heart sounds: Normal heart sounds. No murmur heard.    No friction rub. No gallop.  Pulmonary:     Effort: Pulmonary effort is normal. No respiratory distress.     Breath sounds: Normal breath sounds. No stridor. No wheezing, rhonchi or rales.  Chest:     Chest wall: No tenderness.  Musculoskeletal:     Cervical back: Normal range of motion and neck supple. No rigidity or tenderness.  Lymphadenopathy:     Cervical: No cervical adenopathy.  Neurological:     General: No focal deficit present.     Mental Status: She is alert and oriented to person, place, and time. Mental status is at baseline.     Cranial Nerves: No cranial nerve deficit, dysarthria or facial asymmetry.     Sensory: Sensation is intact. No sensory deficit.     Motor: Motor function is intact. No weakness.     Coordination: Coordination is intact. Coordination normal.     Gait: Gait normal.     Deep Tendon Reflexes: Reflexes normal.   Patient is 20/20 OU, OS, OD        Assessment & Plan:  Temporal arteritis (HCC) - Plan: Sedimentation rate, C-reactive protein, CBC with Differential/Platelet, COMPLETE METABOLIC PANEL WITH GFR History is concerning for temporal arteritis.  However the patient's vision loss is episodic.  It sounds like a migraine related scotoma and aura.  It is  occurring once or twice a week.  However the pain over her right temple is constant.  I will start the patient on prednisone 60 mg a day.  I will check a sed rate and a CRP.  If elevated and her headache response to prednisone, I would recommend rheumatology consultation.  If the sed rate and CRP are completely normal and the prednisone does not help her headache, I will start the patient on a CGRP receptor blocker 300.  If the headache responds to this, I would stop the patient's prednisone at that time.  I have also recommended an ophthalmology consult to rule out any damage to the optic nerve

## 2023-03-09 LAB — CBC WITH DIFFERENTIAL/PLATELET
Absolute Monocytes: 470 {cells}/uL (ref 200–950)
Basophils Absolute: 29 {cells}/uL (ref 0–200)
Basophils Relative: 0.7 %
Eosinophils Absolute: 42 {cells}/uL (ref 15–500)
Eosinophils Relative: 1 %
HCT: 39.7 % (ref 35.0–45.0)
Hemoglobin: 13.3 g/dL (ref 11.7–15.5)
Lymphs Abs: 1688 {cells}/uL (ref 850–3900)
MCH: 31.3 pg (ref 27.0–33.0)
MCHC: 33.5 g/dL (ref 32.0–36.0)
MCV: 93.4 fL (ref 80.0–100.0)
MPV: 9.9 fL (ref 7.5–12.5)
Monocytes Relative: 11.2 %
Neutro Abs: 1970 {cells}/uL (ref 1500–7800)
Neutrophils Relative %: 46.9 %
Platelets: 260 10*3/uL (ref 140–400)
RBC: 4.25 10*6/uL (ref 3.80–5.10)
RDW: 13.1 % (ref 11.0–15.0)
Total Lymphocyte: 40.2 %
WBC: 4.2 10*3/uL (ref 3.8–10.8)

## 2023-03-09 LAB — COMPLETE METABOLIC PANEL WITH GFR
AG Ratio: 2 (calc) (ref 1.0–2.5)
ALT: 15 U/L (ref 6–29)
AST: 16 U/L (ref 10–35)
Albumin: 4 g/dL (ref 3.6–5.1)
Alkaline phosphatase (APISO): 83 U/L (ref 37–153)
BUN: 10 mg/dL (ref 7–25)
CO2: 28 mmol/L (ref 20–32)
Calcium: 9.2 mg/dL (ref 8.6–10.4)
Chloride: 103 mmol/L (ref 98–110)
Creat: 0.66 mg/dL (ref 0.50–1.05)
Globulin: 2 g/dL (ref 1.9–3.7)
Glucose, Bld: 83 mg/dL (ref 65–99)
Potassium: 3.8 mmol/L (ref 3.5–5.3)
Sodium: 140 mmol/L (ref 135–146)
Total Bilirubin: 0.5 mg/dL (ref 0.2–1.2)
Total Protein: 6 g/dL — ABNORMAL LOW (ref 6.1–8.1)
eGFR: 100 mL/min/{1.73_m2} (ref >=60)

## 2023-03-09 LAB — SEDIMENTATION RATE: Sed Rate: 6 mm/h (ref 0–30)

## 2023-03-09 LAB — C-REACTIVE PROTEIN: CRP: <3 mg/L (ref <8.0)

## 2023-03-17 LAB — HM DIABETES EYE EXAM

## 2023-04-05 ENCOUNTER — Encounter: Payer: Self-pay | Admitting: Neurology

## 2023-04-05 ENCOUNTER — Ambulatory Visit: Payer: Medicaid Other | Admitting: Neurology

## 2023-04-05 VITALS — BP 130/80 | Ht 64.0 in | Wt 138.0 lb

## 2023-04-05 DIAGNOSIS — G43E09 Chronic migraine with aura, not intractable, without status migrainosus: Secondary | ICD-10-CM | POA: Diagnosis not present

## 2023-04-05 MED ORDER — TOPIRAMATE 25 MG PO TABS: 50.0000 mg | ORAL_TABLET | Freq: Every evening | ORAL | 11 refills | Status: DC

## 2023-04-05 MED ORDER — RIZATRIPTAN BENZOATE 5 MG PO TBDP: 5.0000 mg | ORAL_TABLET | ORAL | 6 refills | Status: DC | PRN

## 2023-04-05 NOTE — Progress Notes (Signed)
Chief Complaint  Patient presents with   New Patient (Initial Visit)    Rm 14, NP, right eye vision loss, seeing dots, sore tender spots in head      ASSESSMENT AND PLAN  Rhonda [name] is a 62 y.o. female    Migraine with Aura: Recurrent episodes of visual changes followed by headache or head tenderness. The visual changes are similar to her previous migraine auras. The headaches are less severe than her previous migraines. The patient has been using Imitrex with good effect but it causes excessive sedation. -Start Maxalt as needed for acute episodes. -Start Topamax 25mg  nightly for migraine prophylaxis. -Return to clinic if symptoms change or worsen. -Normal ESR, CRP,  MRI of brain no evidence of temporal arteritis  Return to clinic for new issues  DIAGNOSTIC DATA (LABS, IMAGING, TESTING) - I reviewed patient records, labs, notes, testing and imaging myself where available.   MEDICAL HISTORY:  Rhonda [name], seen in request by   Rhonda [name] [place] Dr. Donita Brooks, MD   History is obtained from the patient and review of electronic medical records. I personally reviewed pertinent available imaging films in PACS.     The patient, with a history of migraines, presents with a new headache pattern and visual changes. She describes the headache as a sensation of 'like somebody has hit me in the head with a baseball bat' and it is 'tender, sore.' The pain is located on the right side of the head, sometimes in the back, and behind the eye. The visual changes are described as 'losing vision' or seeing 'spots or lines' or 'partial vision.' These symptoms occur about once a week over the past six or seven months and last about 10-15 minutes. They are sometimes followed by a headache, but not always.   The patient has a history of severe migraines, requiring ER visits for treatment, but has not had a severe migraine in about six years. She still has occasional  headaches, but they are not as severe as the migraines. The patient has tried Imitrex for the migraines, which helps, but makes her sleepy. She also takes ibuprofen or Tylenol for the headaches.  The patient also reports a recent weight loss of 81 pounds following bariatric surgery in 2023. She is currently caring for a special needs son and an elderly mother, and also works as an Veterinary surgeon.  Results LABS CBC: Hb 12.9 (09/2022) CMP: Normal (02/2023) Ferritin: 246 (09/2022) Vitamin B12: 167 (09/2022) Calcium: 9.5 (09/2022) TSH: Normal (09/2022) Albumin: Normal (02/2023)  RADIOLOGY MRI of the brain: No acute abnormality (02/24/2023) CT of the head: Normal (01/2023)   Eye exam by Dr. Dione Booze: No disc edema, suggestive of migraine variant (03/17/2023)  PHYSICAL EXAM:   Vitals:   04/05/23 1502  BP: 130/80  Weight: 138 lb (62.6 kg)  Height: 5\' 4"  (1.626 m)   Body mass index is 23.69 kg/m.  PHYSICAL EXAMNIATION:  Gen: NAD, conversant, well nourised, well groomed                     Cardiovascular: Regular rate rhythm, no peripheral edema, warm, nontender. Eyes: Conjunctivae clear without exudates or hemorrhage Neck: Supple, no carotid bruits. Pulmonary: Clear to auscultation bilaterally   NEUROLOGICAL EXAM:  MENTAL STATUS: Speech/cognition: Awake, alert, oriented to history taking and casual conversation CRANIAL NERVES: CN II: Visual fields are full to confrontation. Pupils are round equal and briskly reactive to light. CN III, IV, VI:  extraocular movement are normal. No ptosis. CN V: Facial sensation is intact to light touch CN VII: Face is symmetric with normal eye closure  CN VIII: Hearing is normal to causal conversation. CN IX, X: Phonation is normal. CN XI: Head turning and shoulder shrug are intact  MOTOR: There is no pronator drift of out-stretched arms. Muscle bulk and tone are normal. Muscle strength is normal.  REFLEXES: Reflexes are 2+ and symmetric at  the biceps, triceps, knees, and ankles. Plantar responses are flexor.  SENSORY: Intact to light touch, pinprick and vibratory sensation are intact in fingers and toes.  COORDINATION: There is no trunk or limb dysmetria noted.  GAIT/STANCE: Posture is normal. Gait is steady with normal steps, base, arm swing, and turning. Heel and toe walking are normal. Tandem gait is normal.  Romberg is absent.  REVIEW OF SYSTEMS:  Full 14 system review of systems performed and notable only for as above All other review of systems were negative.   ALLERGIES: Allergies  Allergen Reactions   Penicillins Hives         Statins Other (See Comments)    MYALGIAS   Lisinopril Cough    HOME MEDICATIONS: Current Outpatient Medications  Medication Sig Dispense Refill   Cholecalciferol (DIALYVITE VITAMIN D 5000) 125 MCG (5000 UT) capsule Take 2 capsules (10,000 Units total) by mouth daily.     desvenlafaxine (PRISTIQ) 25 MG 24 hr tablet Take 1 tablet (25 mg total) by mouth daily. Take with a 50 mg tab for a total of 75 mg daily 90 tablet 0   desvenlafaxine (PRISTIQ) 50 MG 24 hr tablet Take 1 tablet (50 mg total) by mouth daily. Take with a 25 mg tab for a total of 75 mg daily 90 tablet 0   ezetimibe (ZETIA) 10 MG tablet Take 1 tablet (10 mg total) by mouth daily. 90 tablet 1   fluticasone (FLONASE) 50 MCG/ACT nasal spray Place into both nostrils as needed.      montelukast (SINGULAIR) 10 MG tablet TAKE 1 TABLET BY MOUTH EVERYDAY AT BEDTIME 90 tablet 3   Multiple Vitamins-Iron (DAILY VITAMIN/IRON) TABS Chelate Iron     valsartan (DIOVAN) 160 MG tablet Take 1 tablet (160 mg total) by mouth daily. 30 tablet 5   mometasone (ELOCON) 0.1 % cream Apply topically daily. (Patient not taking: Reported on 04/05/2023) 15 g 1   predniSONE (DELTASONE) 20 MG tablet Take 3 tablets (60 mg total) by mouth daily with breakfast. (Patient not taking: Reported on 04/05/2023) 90 tablet 1   No current facility-administered  medications for this visit.    PAST MEDICAL HISTORY: Past Medical History:  Diagnosis Date   Arthritis    knees   Carpal tunnel syndrome of left wrist 07/2016   Depression    History of MRSA infection > 10 years   right leg   Hyperlipidemia    Hypertension    states under control with meds., has been on med. > 2 yr.   OSA (obstructive sleep apnea) 09/05/2019    PAST SURGICAL HISTORY: Past Surgical History:  Procedure Laterality Date   ABDOMINAL HYSTERECTOMY     partial   BREAST EXCISIONAL BIOPSY     CARPAL TUNNEL RELEASE Left 08/26/2016   Procedure: LEFT WRIST CARPAL TUNNEL RELEASE;  Surgeon: Loreta Ave, MD;  Location: [facility];  Service: Orthopedics;  Laterality: Left;   COLONOSCOPY WITH PROPOFOL  03/20/2014   LAPAROSCOPIC UNILATERAL SALPINGECTOMY Right 05/18/2002   OVARY BIOPSY Right 05/18/2002  TRIGGER FINGER RELEASE Left 02/10/2017   Procedure: LEFT TRIGGER THUMB RELEASE (TENDON SHEATH INCISION);  Surgeon: Loreta Ave, MD;  Location: [facility];  Service: Orthopedics;  Laterality: Left;    FAMILY HISTORY: Family History  Problem Relation Age of Onset   Breast cancer Maternal Aunt     SOCIAL HISTORY: Social History   Socioeconomic History   Marital status: Single    Spouse name: Not on file   Number of children: Not on file   Years of education: Not on file   Highest education level: Associate degree: academic program  Occupational History   Not on file  Tobacco Use   Smoking status: Never   Smokeless tobacco: Never  Substance and Sexual Activity   Alcohol use: No   Drug use: No   Sexual activity: Not on file  Other Topics Concern   Not on file  Social History Narrative   Left handed   Caffeine- 2 daily   Social Determinants of Health   Financial Resource Strain: Low Risk  (12/06/2022)   Received from [facility], [facility]   Overall Financial Resource Strain (CARDIA)    Difficulty of Paying Living  Expenses: Not hard at all  Food Insecurity: No Food Insecurity (12/06/2022)   Received from [facility], [facility]   Hunger Vital Sign    Worried About Running Out of Food in the Last Year: Never true    Ran Out of Food in the Last Year: Never true  Transportation Needs: No Transportation Needs (12/06/2022)   Received from [employer], [facility]   PRAPARE - Transportation    Lack of Transportation (Medical): No    Lack of Transportation (Non-Medical): No  Physical Activity: Sufficiently Active (12/06/2022)   Received from [facility], [facility]   Exercise Vital Sign    Days of Exercise per Week: 3 days    Minutes of Exercise per Session: 60 min  Recent Concern: Physical Activity - Insufficiently Active (09/15/2022)   Exercise Vital Sign    Days of Exercise per Week: 3 days    Minutes of Exercise per Session: 30 min  Stress: No Stress Concern Present (12/06/2022)   Received from [facility], [facility] - Occupational Stress Questionnaire    Feeling of Stress : Not at all  Social Connections: Socially Integrated (12/06/2022)   Received from [facility], [facility]   Social Network    How would you rate your social network (family, work, friends)?: Good participation with social networks  Intimate Partner Violence: Not At Risk (02/24/2023)   Received from [facility]   HITS    Over the last 12 months how often did your partner physically hurt you?: 1    Over the last 12 months how often did your partner insult you or talk down to you?: 1    Over the last 12 months how often did your partner threaten you with physical harm?: 1    Over the last 12 months how often did your partner scream or curse at you?: 1      Levert Feinstein, M.D. Ph.D.  [facility] [address], [address] [place], [place] [address] Ph: [phone] Fax: [phone]  CC:  Ernesto Rutherford, MD [address] [place],  [place]  [address]  Donita Brooks, MD

## 2023-04-20 ENCOUNTER — Telehealth: Payer: Self-pay | Admitting: Neurology

## 2023-04-20 NOTE — Telephone Encounter (Signed)
Pt LVM to reschedule appt due to son's has an appt.

## 2023-04-27 ENCOUNTER — Ambulatory Visit: Payer: Medicaid Other | Admitting: Family Medicine

## 2023-05-05 ENCOUNTER — Ambulatory Visit: Payer: Medicaid Other | Admitting: Family Medicine

## 2023-05-05 ENCOUNTER — Encounter: Payer: Self-pay | Admitting: Family Medicine

## 2023-05-05 ENCOUNTER — Encounter: Payer: Self-pay | Admitting: Neurology

## 2023-05-05 VITALS — BP 116/60 | HR 71 | Temp 98.6°F | Ht 64.0 in | Wt 134.4 lb

## 2023-05-05 DIAGNOSIS — R079 Chest pain, unspecified: Secondary | ICD-10-CM | POA: Insufficient documentation

## 2023-05-05 DIAGNOSIS — K219 Gastro-esophageal reflux disease without esophagitis: Secondary | ICD-10-CM | POA: Insufficient documentation

## 2023-05-05 DIAGNOSIS — R1032 Left lower quadrant pain: Secondary | ICD-10-CM

## 2023-05-05 DIAGNOSIS — R0789 Other chest pain: Secondary | ICD-10-CM | POA: Diagnosis not present

## 2023-05-05 NOTE — Progress Notes (Signed)
Subjective:  HPI: Rhonda [name] is a 62 y.o. female presenting on 05/05/2023 for Nausea and Gas (Pt states she has gas in her chest and back. She is burping a lot. She states it moves the gas but doesn't go completely away.)   HPI Patient is in today for feeling of gas and heaviness in her chest that radiates as a sharp pain in her back. Stretching and burping relieves the pain, it is worse when she is cleaning/bending/lifting. It is accompanied by shortness of breath. It is central to left sided. She was started on Topamax 2 weeks ago for headaches and blurred vision. Since she has had worsening indigestion and gas, she did "spit up"/vomit after eating past 2-3 days, it is bilious. She is also experiencing constipation despite using a stool softener daily. Has tried Omeprazole nightly without relief. She does not eat fatty foods, spicy foods, large meals, eat then lie down, caffeine, ETOH, smoking.   Review of Systems  All other systems reviewed and are negative.   Relevant past medical history reviewed and updated as indicated.   Past Medical History:  Diagnosis Date   Arthritis    knees   Carpal tunnel syndrome of left wrist 07/2016   Depression    History of MRSA infection > 10 years   right leg   Hyperlipidemia    Hypertension    states under control with meds., has been on med. > 2 yr.   OSA (obstructive sleep apnea) 09/05/2019     Past Surgical History:  Procedure Laterality Date   ABDOMINAL HYSTERECTOMY     partial   BREAST EXCISIONAL BIOPSY     CARPAL TUNNEL RELEASE Left 08/26/2016   Procedure: LEFT WRIST CARPAL TUNNEL RELEASE;  Surgeon: Loreta Ave, MD;  Location: [facility];  Service: Orthopedics;  Laterality: Left;   COLONOSCOPY WITH PROPOFOL  03/20/2014   LAPAROSCOPIC UNILATERAL SALPINGECTOMY Right 05/18/2002   OVARY BIOPSY Right 05/18/2002   TRIGGER FINGER RELEASE Left 02/10/2017   Procedure: LEFT TRIGGER THUMB RELEASE (TENDON SHEATH  INCISION);  Surgeon: Loreta Ave, MD;  Location: [facility];  Service: Orthopedics;  Laterality: Left;    Allergies and medications reviewed and updated.   Current Outpatient Medications:    Cholecalciferol (DIALYVITE VITAMIN D 5000) 125 MCG (5000 UT) capsule, Take 2 capsules (10,000 Units total) by mouth daily., Disp: , Rfl:    desvenlafaxine (PRISTIQ) 25 MG 24 hr tablet, Take 1 tablet (25 mg total) by mouth daily. Take with a 50 mg tab for a total of 75 mg daily, Disp: 90 tablet, Rfl: 0   desvenlafaxine (PRISTIQ) 50 MG 24 hr tablet, Take 1 tablet (50 mg total) by mouth daily. Take with a 25 mg tab for a total of 75 mg daily, Disp: 90 tablet, Rfl: 0   ezetimibe (ZETIA) 10 MG tablet, Take 1 tablet (10 mg total) by mouth daily., Disp: 90 tablet, Rfl: 1   fluticasone (FLONASE) 50 MCG/ACT nasal spray, Place into both nostrils as needed. , Disp: , Rfl:    montelukast (SINGULAIR) 10 MG tablet, TAKE 1 TABLET BY MOUTH EVERYDAY AT BEDTIME, Disp: 90 tablet, Rfl: 3   Multiple Vitamins-Iron (DAILY VITAMIN/IRON) TABS, Chelate Iron, Disp: , Rfl:    omeprazole (PRILOSEC) 40 MG capsule, Take 40 mg by mouth daily., Disp: , Rfl:    rizatriptan (MAXALT-MLT) 5 MG disintegrating tablet, Take 1 tablet (5 mg total) by mouth as needed. May repeat in 2 hours if needed,  Disp: 10 tablet, Rfl: 6   topiramate (TOPAMAX) 25 MG tablet, Take 2 tablets (50 mg total) by mouth at bedtime., Disp: 60 tablet, Rfl: 11   valsartan (DIOVAN) 160 MG tablet, Take 1 tablet (160 mg total) by mouth daily., Disp: 30 tablet, Rfl: 5   mometasone (ELOCON) 0.1 % cream, Apply topically daily. (Patient not taking: Reported on 04/05/2023), Disp: 15 g, Rfl: 1   predniSONE (DELTASONE) 20 MG tablet, Take 3 tablets (60 mg total) by mouth daily with breakfast. (Patient not taking: Reported on 04/05/2023), Disp: 90 tablet, Rfl: 1  Allergies  Allergen Reactions   Penicillins Hives         Statins Other (See Comments)    MYALGIAS    Lisinopril Cough    Objective:   BP 116/60   Pulse 71   Temp 98.6 F (37 C)   Ht 5\' 4"  (1.626 m)   Wt 134 lb 6 oz (61 kg)   SpO2 98%   BMI 23.07 kg/m      05/05/2023    3:39 PM 04/05/2023    3:02 PM 03/08/2023   12:24 PM  Vitals with BMI  Height 5\' 4"  5\' 4"  5\' 3"   Weight 134 lbs 6 oz 138 lbs 139 lbs 10 oz  BMI 23.05 23.68 24.74  Systolic 116 130 016  Diastolic 60 80 88  Pulse 71  73     Physical Exam Vitals and nursing note reviewed.  Constitutional:      Appearance: Normal appearance. She is normal weight.  HENT:     Head: Normocephalic and atraumatic.  Cardiovascular:     Rate and Rhythm: Normal rate and regular rhythm.     Pulses: Normal pulses.     Heart sounds: Normal heart sounds.  Pulmonary:     Effort: Pulmonary effort is normal.     Breath sounds: Normal breath sounds.  Abdominal:     General: Abdomen is flat. Bowel sounds are normal. There is no distension.     Palpations: Abdomen is soft.     Tenderness: There is abdominal tenderness in the left lower quadrant.  Skin:    General: Skin is warm and dry.  Neurological:     General: No focal deficit present.     Mental Status: She is alert and oriented to person, place, and time. Mental status is at baseline.  Psychiatric:        Mood and Affect: Mood normal.        Behavior: Behavior normal.        Thought Content: Thought content normal.        Judgment: Judgment normal.     Assessment & Plan:  Gastroesophageal reflux disease without esophagitis Assessment & Plan: Counseled on importance of taking Omeprazole 30 minutes prior to breakfast. Discussed etiology of GERD and symptoms. Anti-reflux measures such as raising the head of the bed, avoiding tight clothing or belts, avoiding eating late at night and not lying down shortly after mealtime and achieving weight loss are discussed. Avoid ASA, NSAID's, caffeine, peppermints, alcohol and tobacco. OTC H2 blockers and/or antacids are often very helpful  for PRN use. She should alert me if there are persistent symptoms, dysphagia, weight loss or GI bleeding. Follow up if symptoms worse or do not improve in 1 week.    Left lower quadrant abdominal pain Assessment & Plan: Tender to palpation on LLQ. She endorses constipation and nausea with small amount of bilious vomit. Will obtain CBC and CRP. Encouraged Miralax,  proper administration of Omeprazole, and holding Topamax for now. Notify Neurology. Will consider CT if persistent.  Orders: -     CBC with Differential/Platelet -     C-reactive protein -     Comprehensive metabolic panel  Atypical chest pain Assessment & Plan: EKG NSR unchanged from prior, low suspicion for cardiac etiology but discussed red flags and when to seek medical care.   Orders: -     EKG 12-Lead     Follow up plan: Return if symptoms worsen or fail to improve.  Park Meo, FNP

## 2023-05-05 NOTE — Assessment & Plan Note (Signed)
Counseled on importance of taking Omeprazole 30 minutes prior to breakfast. Discussed etiology of GERD and symptoms. Anti-reflux measures such as raising the head of the bed, avoiding tight clothing or belts, avoiding eating late at night and not lying down shortly after mealtime and achieving weight loss are discussed. Avoid ASA, NSAID's, caffeine, peppermints, alcohol and tobacco. OTC H2 blockers and/or antacids are often very helpful for PRN use. She should alert me if there are persistent symptoms, dysphagia, weight loss or GI bleeding. Follow up if symptoms worse or do not improve in 1 week.

## 2023-05-05 NOTE — Assessment & Plan Note (Addendum)
Tender to palpation on LLQ. She endorses constipation and nausea with small amount of bilious vomit. Will obtain CBC and CRP. Encouraged Miralax, proper administration of Omeprazole, and holding Topamax for now. Notify Neurology. Will consider CT if persistent.

## 2023-05-05 NOTE — Progress Notes (Signed)
Subjective:  HPI: Rhonda [name] is a 62 y.o. female presenting on 05/05/2023 for Nausea and Gas (Pt states she has gas in her chest and back. She is burping a lot. She states it moves the gas but doesn't go completely away.)   HPI Patient is in today for feeling of gas and heaviness in her chest that radiates as a sharp pain in her back. Stretching and burping relieves the pain, it is worse when she is cleaning/bending/lifting. It is accompanied by shortness of breath. It is central to left sided. She was started on Topamax 2 weeks ago for headaches and blurred vision. Since she has had worsening indigestion and gas, she did "spit up"/vomit after eating past 2-3 days, it is bilious. She is also experiencing constipation despite using a stool softener daily. Has tried Omeprazole nightly without relief. She does not eat fatty foods, spicy foods, large meals, eat then lie down, caffeine, ETOH, smoking.   Review of Systems  All other systems reviewed and are negative.   Relevant past medical history reviewed and updated as indicated.   Past Medical History:  Diagnosis Date   Arthritis    knees   Carpal tunnel syndrome of left wrist 07/2016   Depression    History of MRSA infection > 10 years   right leg   Hyperlipidemia    Hypertension    states under control with meds., has been on med. > 2 yr.   OSA (obstructive sleep apnea) 09/05/2019     Past Surgical History:  Procedure Laterality Date   ABDOMINAL HYSTERECTOMY     partial   BREAST EXCISIONAL BIOPSY     CARPAL TUNNEL RELEASE Left 08/26/2016   Procedure: LEFT WRIST CARPAL TUNNEL RELEASE;  Surgeon: Loreta Ave, MD;  Location: [facility];  Service: Orthopedics;  Laterality: Left;   COLONOSCOPY WITH PROPOFOL  03/20/2014   LAPAROSCOPIC UNILATERAL SALPINGECTOMY Right 05/18/2002   OVARY BIOPSY Right 05/18/2002   TRIGGER FINGER RELEASE Left 02/10/2017   Procedure: LEFT TRIGGER THUMB RELEASE (TENDON SHEATH  INCISION);  Surgeon: Loreta Ave, MD;  [facility];  Service: Orthopedics;  Laterality: Left;    Allergies and medications reviewed and updated.   Current Outpatient Medications:    Cholecalciferol (DIALYVITE VITAMIN D 5000) 125 MCG (5000 UT) capsule, Take 2 capsules (10,000 Units total) by mouth daily., Disp: , Rfl:    desvenlafaxine (PRISTIQ) 25 MG 24 hr tablet, Take 1 tablet (25 mg total) by mouth daily. Take with a 50 mg tab for a total of 75 mg daily, Disp: 90 tablet, Rfl: 0   desvenlafaxine (PRISTIQ) 50 MG 24 hr tablet, Take 1 tablet (50 mg total) by mouth daily. Take with a 25 mg tab for a total of 75 mg daily, Disp: 90 tablet, Rfl: 0   ezetimibe (ZETIA) 10 MG tablet, Take 1 tablet (10 mg total) by mouth daily., Disp: 90 tablet, Rfl: 1   fluticasone (FLONASE) 50 MCG/ACT nasal spray, Place into both nostrils as needed. , Disp: , Rfl:    montelukast (SINGULAIR) 10 MG tablet, TAKE 1 TABLET BY MOUTH EVERYDAY AT BEDTIME, Disp: 90 tablet, Rfl: 3   Multiple Vitamins-Iron (DAILY VITAMIN/IRON) TABS, Chelate Iron, Disp: , Rfl:    omeprazole (PRILOSEC) 40 MG capsule, Take 40 mg by mouth daily., Disp: , Rfl:    rizatriptan (MAXALT-MLT) 5 MG disintegrating tablet, Take 1 tablet (5 mg total) by mouth as needed. May repeat in 2 hours if needed,  Disp: 10 tablet, Rfl: 6   topiramate (TOPAMAX) 25 MG tablet, Take 2 tablets (50 mg total) by mouth at bedtime., Disp: 60 tablet, Rfl: 11   valsartan (DIOVAN) 160 MG tablet, Take 1 tablet (160 mg total) by mouth daily., Disp: 30 tablet, Rfl: 5   mometasone (ELOCON) 0.1 % cream, Apply topically daily. (Patient not taking: Reported on 04/05/2023), Disp: 15 g, Rfl: 1   predniSONE (DELTASONE) 20 MG tablet, Take 3 tablets (60 mg total) by mouth daily with breakfast. (Patient not taking: Reported on 04/05/2023), Disp: 90 tablet, Rfl: 1  Allergies  Allergen Reactions   Penicillins Hives         Statins Other (See Comments)    MYALGIAS    Lisinopril Cough    Objective:   BP 116/60   Pulse 71   Temp 98.6 F (37 C)   Ht 5\' 4"  (1.626 m)   Wt 134 lb 6 oz (61 kg)   SpO2 98%   BMI 23.07 kg/m      05/05/2023    3:39 PM 04/05/2023    3:02 PM 03/08/2023   12:24 PM  Vitals with BMI  Height 5\' 4"  5\' 4"  5\' 3"   Weight 134 lbs 6 oz 138 lbs 139 lbs 10 oz  BMI 23.05 23.68 24.74  Systolic 116 130 295  Diastolic 60 80 88  Pulse 71  73     Physical Exam Vitals and nursing note reviewed.  Constitutional:      Appearance: Normal appearance. She is normal weight.  HENT:     Head: Normocephalic and atraumatic.  Cardiovascular:     Rate and Rhythm: Normal rate and regular rhythm.     Pulses: Normal pulses.     Heart sounds: Normal heart sounds.  Pulmonary:     Effort: Pulmonary effort is normal.     Breath sounds: Normal breath sounds.  Abdominal:     General: Abdomen is flat. Bowel sounds are normal. There is no distension.     Palpations: Abdomen is soft.     Tenderness: There is abdominal tenderness in the left lower quadrant.  Skin:    General: Skin is warm and dry.  Neurological:     General: No focal deficit present.     Mental Status: She is alert and oriented to person, place, and time. Mental status is at baseline.  Psychiatric:        Mood and Affect: Mood normal.        Behavior: Behavior normal.        Thought Content: Thought content normal.        Judgment: Judgment normal.     Assessment & Plan:  Gastroesophageal reflux disease without esophagitis Assessment & Plan: Counseled on importance of taking Omeprazole 30 minutes prior to breakfast. Discussed etiology of GERD and symptoms. Anti-reflux measures such as raising the head of the bed, avoiding tight clothing or belts, avoiding eating late at night and not lying down shortly after mealtime and achieving weight loss are discussed. Avoid ASA, NSAID's, caffeine, peppermints, alcohol and tobacco. OTC H2 blockers and/or antacids are often very helpful  for PRN use. She should alert me if there are persistent symptoms, dysphagia, weight loss or GI bleeding. Follow up if symptoms worse or do not improve in 1 week.    Left lower quadrant abdominal pain Assessment & Plan: Tender to palpation on LLQ. She endorses constipation and nausea with small amount of bilious vomit. Will obtain CBC and CRP. Encouraged Miralax,  proper administration of Omeprazole, and holding Topamax for now. Notify Neurology.  Orders: -     CBC with Differential/Platelet -     C-reactive protein -     Comprehensive metabolic panel  Atypical chest pain Assessment & Plan: EKG NSR unchanged from prior, low suspicion for cardiac etiology but discussed red flags and when to seek medical care.   Orders: -     EKG 12-Lead     Follow up plan: No follow-ups on file.  Park Meo, FNP

## 2023-05-05 NOTE — Assessment & Plan Note (Addendum)
EKG NSR unchanged from prior, low suspicion for cardiac etiology but discussed red flags and when to seek medical care.

## 2023-05-06 ENCOUNTER — Other Ambulatory Visit: Payer: Self-pay

## 2023-05-06 LAB — CBC WITH DIFFERENTIAL/PLATELET
Absolute Lymphocytes: 2057 {cells}/uL (ref 850–3900)
Absolute Monocytes: 464 {cells}/uL (ref 200–950)
Basophils Absolute: 32 {cells}/uL (ref 0–200)
Basophils Relative: 0.7 %
Eosinophils Absolute: 50 {cells}/uL (ref 15–500)
Eosinophils Relative: 1.1 %
HCT: 38.4 % (ref 35.0–45.0)
Hemoglobin: 13.1 g/dL (ref 11.7–15.5)
MCH: 32.2 pg (ref 27.0–33.0)
MCHC: 34.1 g/dL (ref 32.0–36.0)
MCV: 94.3 fL (ref 80.0–100.0)
MPV: 9.9 fL (ref 7.5–12.5)
Monocytes Relative: 10.3 %
Neutro Abs: 1899 {cells}/uL (ref 1500–7800)
Neutrophils Relative %: 42.2 %
Platelets: 223 10*3/uL (ref 140–400)
RBC: 4.07 10*6/uL (ref 3.80–5.10)
RDW: 12.4 % (ref 11.0–15.0)
Total Lymphocyte: 45.7 %
WBC: 4.5 10*3/uL (ref 3.8–10.8)

## 2023-05-06 LAB — COMPREHENSIVE METABOLIC PANEL
AG Ratio: 1.9 (calc) (ref 1.0–2.5)
ALT: 11 U/L (ref 6–29)
AST: 16 U/L (ref 10–35)
Albumin: 4 g/dL (ref 3.6–5.1)
Alkaline phosphatase (APISO): 85 U/L (ref 37–153)
BUN: 12 mg/dL (ref 7–25)
CO2: 28 mmol/L (ref 20–32)
Calcium: 8.8 mg/dL (ref 8.6–10.4)
Chloride: 106 mmol/L (ref 98–110)
Creat: 0.7 mg/dL (ref 0.50–1.05)
Globulin: 2.1 g/dL (ref 1.9–3.7)
Glucose, Bld: 88 mg/dL (ref 65–99)
Potassium: 3.9 mmol/L (ref 3.5–5.3)
Sodium: 142 mmol/L (ref 135–146)
Total Bilirubin: 0.3 mg/dL (ref 0.2–1.2)
Total Protein: 6.1 g/dL (ref 6.1–8.1)

## 2023-05-06 LAB — C-REACTIVE PROTEIN: CRP: <3 mg/L (ref <8.0)

## 2023-05-06 NOTE — Telephone Encounter (Signed)
Prescription Request  05/06/2023  LOV: 05/05/23  What is the name of the medication or equipment? ezetimibe (ZETIA) 10 MG tablet [[id number]]  Have you contacted your pharmacy to request a refill? Yes   Which pharmacy would you like this sent to?  [id number] - [place], [place] - [address] AT [facility] [address] [place] [address] Phone: [phone] Fax: [phone]    Patient notified that their request is being sent to the clinical staff for review and that they should receive a response within 2 business days.   Please advise at [facility] [phone]

## 2023-05-09 MED ORDER — EZETIMIBE 10 MG PO TABS: 10.0000 mg | ORAL_TABLET | Freq: Every day | ORAL | 0 refills | Status: DC

## 2023-05-09 NOTE — Progress Notes (Unsigned)
BH MD/PA/NP OP Progress Note  05/10/2023 11:25 AM Rhonda [name]  MRN:  [id number]  Visit Diagnosis:    ICD-10-CM   1. GAD (generalized anxiety disorder)  F41.1 desvenlafaxine (PRISTIQ) 50 MG 24 hr tablet    desvenlafaxine (PRISTIQ) 25 MG 24 hr tablet    2. Depression, major, single episode, moderate (HCC)  F32.1 desvenlafaxine (PRISTIQ) 50 MG 24 hr tablet    desvenlafaxine (PRISTIQ) 25 MG 24 hr tablet    traZODone (DESYREL) 50 MG tablet      Assessment: Rhonda [name] is a 62 y.o. female with a history of depression, obstructive sleep apnea who presented to [facility] for initial evaluation on 04/15/22.    During initial evaluation patient reported symptoms of anxiety, increased irritability, increased fatigue, decreased concentration, low mood, and increased need to sleep.  Patient reported that her symptoms tend to be worse in the fall and winter.  She denied any suicidal ideation or thoughts of self-harm along with any history of psychosis, mania, paranoia, or delusions.  Of note patient has sleep apnea which is well-controlled with CPAP, and vitamin D deficiency which is well-controlled.  Patient's symptoms meet criteria for MDD at time of initial evaluation.  Following patient's gastric bypass she has been unable to tolerate medications and was noted to be experiencing increased symptoms of anxiety including excessive worry, restlessness, increased irritability, and feeling overwhelmed.  With the symptoms she had criteria for generalized anxiety disorder.  Rhonda [name] presents for follow-up evaluation. Today, 05/10/23, patient reports that her depression and anxiety have remained stable.  She has had more trouble with insomnia lately which has in turn resulted in increased irritability.  While there is some uncertainty in the trigger for the onset of insomnia she did start Topamax around that time.  Suggested altering the dosing of Topamax  to earlier in the day.  We will also start trazodone 25 to 50 mg as needed for insomnia and reviewed the risk and benefits.  We will follow up in 2 to 3 months.  Plan: - Continue Pristiq 75 mg every day - Start Trazodone 25-50 mg at bedtime prn for insomnia - Continue Vit D 10000 units daily managed by PCP - CMP, CBC, lipid profile, Vit D, TSH, A1c reviewd - Can consider therapy in the future if needed - Follow up in 2-3 months  Chief Complaint:  Chief Complaint  Patient presents with   Follow-up   HPI: Rhonda [name] presents reporting that she is doing well.  Rhonda [name]'s only concern at this time is to onset of insomnia.  She will go to bed around 10:30 to 11 PM and then wake up at 2 AM.  Occasionally she is able to fall back asleep.  If she takes melatonin 10 mg at bedtime that she is able to sleep until around 4 AM.  With the lack of sleep she has been feeling more irritable and did have a bit of a negative interaction with her sister.  Outside of this though patient reports things have been going well.  She notes that she was started on Topamax recently for migraines with good benefit.  This however has been the only recent change that correlates with the onset of her insomnia.  We reviewed that while Topamax can sometimes cause sedation it also could be responsible for her insomnia.  Suggested trying to take the medication earlier in the day.  We also discussed the possibility of starting an as needed medication  for sleep which patient was interested in.  We will start trazodone and reviewed the risk and benefits.  Past Psychiatric History: Patient reports that her psychiatric care started in 1991 following her father's suicide.  She was diagnosed with depression and seasonal affective disorder.  Patient was tried on a number of medications including Cymbalta, Paxil, Zoloft, Lexapro, Wellbutrin XL 300 mg (worked well in the past with Pristiq), and Pristiq.  She denies trying any mood stabilizers or  antipsychotics.  Patient notes that she is prescribed gabapentin for pain and it made her feel a bit loopy.  Patient attended therapy after separating for her husband and reports it was helpful at the time.  She denies any SI/HI or thoughts of self-harm or any prior suicide attempts as well as any past psychiatric hospitalizations.  Patient denies any substance use.  Past Medical History:  Past Medical History:  Diagnosis Date   Arthritis    knees   Carpal tunnel syndrome of left wrist 07/2016   Depression    History of MRSA infection > 10 years   right leg   Hyperlipidemia    Hypertension    states under control with meds., has been on med. > 2 yr.   OSA (obstructive sleep apnea) 09/05/2019    Past Surgical History:  Procedure Laterality Date   ABDOMINAL HYSTERECTOMY     partial   BREAST EXCISIONAL BIOPSY     CARPAL TUNNEL RELEASE Left 08/26/2016   Procedure: LEFT WRIST CARPAL TUNNEL RELEASE;  Surgeon: Loreta Ave, MD;  Location: [facility];  Service: Orthopedics;  Laterality: Left;   COLONOSCOPY WITH PROPOFOL  03/20/2014   LAPAROSCOPIC UNILATERAL SALPINGECTOMY Right 05/18/2002   OVARY BIOPSY Right 05/18/2002   TRIGGER FINGER RELEASE Left 02/10/2017   Procedure: LEFT TRIGGER THUMB RELEASE (TENDON SHEATH INCISION);  Surgeon: Loreta Ave, MD;  Location: [facility];  Service: Orthopedics;  Laterality: Left;    Family Psychiatric History: Patient reports that her father had depression and committed suicide in 50.  Family History:  Family History  Problem Relation Age of Onset   Breast cancer Maternal Aunt     Social History:  Social History   Socioeconomic History   Marital status: Single    Spouse name: Not on file   Number of children: Not on file   Years of education: Not on file   Highest education level: Associate degree: academic program  Occupational History   Not on file  Tobacco Use   Smoking status: Never   Smokeless  tobacco: Never  Substance and Sexual Activity   Alcohol use: No   Drug use: No   Sexual activity: Not on file  Other Topics Concern   Not on file  Social History Narrative   Left handed   Caffeine- 2 daily   Social Determinants of Health   Financial Resource Strain: Patient Declined (05/05/2023)   Overall Financial Resource Strain (CARDIA)    Difficulty of Paying Living Expenses: Patient declined  Food Insecurity: Patient Declined (05/05/2023)   Hunger Vital Sign    Worried About Running Out of Food in the Last Year: Patient declined    Ran Out of Food in the Last Year: Patient declined  Transportation Needs: Patient Declined (05/05/2023)   PRAPARE - Administrator, Civil Service (Medical): Patient declined    Lack of Transportation (Non-Medical): Patient declined  Physical Activity: Unknown (05/05/2023)   Exercise Vital Sign    Days of Exercise per  Week: Patient declined    Minutes of Exercise per Session: 30 min  Stress: Patient Declined (05/05/2023)   [employer] of Occupational Health - Occupational Stress Questionnaire    Feeling of Stress : Patient declined  Social Connections: Unknown (05/05/2023)   Social Connection and Isolation Panel [NHANES]    Frequency of Communication with Friends and Family: Patient declined    Frequency of Social Gatherings with Friends and Family: Patient declined    Attends Religious Services: Patient declined    Database administrator or Organizations: Patient declined    Attends Engineer, structural: More than 4 times per year    Marital Status: Patient declined    Allergies:  Allergies  Allergen Reactions   Penicillins Hives         Statins Other (See Comments)    MYALGIAS   Lisinopril Cough    Current Medications: Current Outpatient Medications  Medication Sig Dispense Refill   traZODone (DESYREL) 50 MG tablet Take 0.5-1 tablets (25-50 mg total) by mouth at bedtime. 90 tablet 90   Cholecalciferol  (DIALYVITE VITAMIN D 5000) 125 MCG (5000 UT) capsule Take 2 capsules (10,000 Units total) by mouth daily.     desvenlafaxine (PRISTIQ) 25 MG 24 hr tablet Take 1 tablet (25 mg total) by mouth daily. Take with a 50 mg tab for a total of 75 mg daily 90 tablet 0   desvenlafaxine (PRISTIQ) 50 MG 24 hr tablet Take 1 tablet (50 mg total) by mouth daily. Take with a 25 mg tab for a total of 75 mg daily 90 tablet 0   ezetimibe (ZETIA) 10 MG tablet Take 1 tablet (10 mg total) by mouth daily. 90 tablet 0   fluticasone (FLONASE) 50 MCG/ACT nasal spray Place into both nostrils as needed.      mometasone (ELOCON) 0.1 % cream Apply topically daily. (Patient not taking: Reported on 04/05/2023) 15 g 1   montelukast (SINGULAIR) 10 MG tablet TAKE 1 TABLET BY MOUTH EVERYDAY AT BEDTIME 90 tablet 3   Multiple Vitamins-Iron (DAILY VITAMIN/IRON) TABS Chelate Iron     omeprazole (PRILOSEC) 40 MG capsule Take 40 mg by mouth daily.     rizatriptan (MAXALT-MLT) 5 MG disintegrating tablet Take 1 tablet (5 mg total) by mouth as needed. May repeat in 2 hours if needed 10 tablet 6   topiramate (TOPAMAX) 25 MG tablet Take 2 tablets (50 mg total) by mouth at bedtime. 60 tablet 11   valsartan (DIOVAN) 160 MG tablet Take 1 tablet (160 mg total) by mouth daily. 30 tablet 5   No current facility-administered medications for this visit.     Psychiatric Specialty Exam: Review of Systems  There were no vitals taken for this visit.There is no height or weight on file to calculate BMI.  General Appearance: Fairly Groomed  Eye Contact:  Good  Speech:  Clear and Coherent and Normal Rate  Volume:  Normal  Mood:  Euthymic  Affect:  Congruent  Thought Process:  Coherent, Goal Directed, and Linear  Orientation:  Full (Time, Place, and Person)  Thought Content: Logical   Suicidal Thoughts:  No  Homicidal Thoughts:  No  Memory:  NA  Judgement:  Good  Insight:  Good  Psychomotor Activity:  Normal  Concentration:  Concentration: Good   Recall:  Good  Fund of Knowledge: Good  Language: Good  Akathisia:  NA    AIMS (if indicated): not done  Assets:  Communication Skills Desire for Improvement Housing Leisure Time Physical  Health Resilience Social Support Talents/Skills Transportation Vocational/Educational  ADL's:  Intact  Cognition: WNL  Sleep:  Fair   Metabolic Disorder Labs: Lab Results  Component Value Date   HGBA1C 5.9 (H) 04/08/2022   MPG 123 04/08/2022   MPG 114 08/06/2019   No results found for: "PROLACTIN" Lab Results  Component Value Date   CHOL 212 (H) 05/07/2022   TRIG 70 05/07/2022   HDL 67 05/07/2022   CHOLHDL 3.2 05/07/2022   VLDL 17 08/02/2016   LDLCALC 129 (H) 05/07/2022   LDLCALC 142 (H) 10/30/2021   Lab Results  Component Value Date   TSH 0.45 09/17/2022   TSH 0.82 10/30/2021    Therapeutic Level Labs: No results found for: "LITHIUM" No results found for: "VALPROATE" No results found for: "CBMZ"   Screenings: GAD-7    Flowsheet Row Office Visit from 03/08/2023 in [facility] Office Visit from 05/25/2022 in [facility] Office Visit from 05/07/2022 in [facility] Office Visit from 10/29/2019 in [facility] Office Visit from 08/06/2019 in [facility]  Total GAD-7 Score 6 0 0 9 21      PHQ2-9    Flowsheet Row Office Visit from 05/05/2023 in [facility] Office Visit from 03/08/2023 in [facility] Office Visit from 05/25/2022 in [facility] [facility] Office Visit from 05/07/2022 in [facility] Office Visit from 04/15/2022 in [facility]  PHQ-2 Total Score 0 0 0 0 1  PHQ-9 Total Score -- -- 0 1 5       Collaboration of Care: Collaboration of Care: Medication Management AEB medication prescription and Other provider  involved in patient's care AEB ED, PCP, and neurology chart review  Patient/Guardian was advised Release of Information must be obtained prior to any record release in order to collaborate their care with an outside provider. Patient/Guardian was advised if they have not already done so to contact the registration department to sign all necessary forms in order for [place] to release information regarding their care.   Consent: Patient/Guardian gives verbal consent for treatment and assignment of benefits for services provided during this visit. Patient/Guardian expressed understanding and agreed to proceed.    Stasia Cavalier, MD 05/10/2023, 11:25 AM   Virtual Visit via Video Note  I connected with Rhonda [name] on 05/10/23 at 11:00 AM EST by a video enabled telemedicine application and verified that I am speaking with the correct person using two identifiers.  Location: Patient: Home Provider: Home Office   I discussed the limitations of evaluation and management by telemedicine and the availability of in person appointments. The patient expressed understanding and agreed to proceed.   I discussed the assessment and treatment plan with the patient. The patient was provided an opportunity to ask questions and all were answered. The patient agreed with the plan and demonstrated an understanding of the instructions.   The patient was advised to call back or seek an in-person evaluation if the symptoms worsen or if the condition fails to improve as anticipated.  I provided 10 minutes of non-face-to-face time during this encounter.   Stasia Cavalier, MD

## 2023-05-09 NOTE — Telephone Encounter (Signed)
Requested Prescriptions  Pending Prescriptions Disp Refills   ezetimibe (ZETIA) 10 MG tablet 90 tablet 0    Sig: Take 1 tablet (10 mg total) by mouth daily.     Cardiovascular:  Antilipid - Sterol Transport Inhibitors Failed - 05/06/2023 11:59 AM      Failed - Valid encounter within last 12 months    Recent Outpatient Visits           1 year ago Pure hypercholesterolemia   [facility] Pickard, Priscille Heidelberg, MD   1 year ago Pure hypercholesterolemia   [facility] Tanya Nones, Priscille Heidelberg, MD   2 years ago Essential hypertension, benign   [facility] Pickard, Priscille Heidelberg, MD   3 years ago General medical exam   [facility] Donita Brooks, MD   3 years ago Essential hypertension, benign   [facility] [place], Velna Hatchet, MD       Future Appointments             In 3 weeks Tanya Nones, Priscille Heidelberg, MD [facility]            Failed - Lipid Panel in normal range within the last 12 months    Cholesterol  Date Value Ref Range Status  05/07/2022 212 (H) <200 mg/dL Final   LDL Cholesterol (Calc)  Date Value Ref Range Status  05/07/2022 129 (H) mg/dL (calc) Final    Comment:    Reference range: <100 . Desirable range <100 mg/dL for primary prevention;   <70 mg/dL for patients with CHD or diabetic patients  with > or = 2 CHD risk factors. Marland Kitchen LDL-C is now calculated using the Martin-Hopkins  calculation, which is a validated novel method providing  better accuracy than the Friedewald equation in the  estimation of LDL-C.  Horald Pollen et al. Lenox Ahr. 2536;644(03): 2061-2068  (http://education.[employer].com/faq/FAQ164)    HDL  Date Value Ref Range Status  05/07/2022 67 > OR = 50 mg/dL Final   Triglycerides  Date Value Ref Range Status  05/07/2022 70 <150 mg/dL Final         Passed - AST in normal range and within 360 days    AST  Date Value Ref Range Status   05/05/2023 16 10 - 35 U/L Final         Passed - ALT in normal range and within 360 days    ALT  Date Value Ref Range Status  05/05/2023 11 6 - 29 U/L Final         Passed - Patient is not pregnant

## 2023-05-10 ENCOUNTER — Telehealth (HOSPITAL_COMMUNITY): Payer: Medicaid Other | Admitting: Psychiatry

## 2023-05-10 ENCOUNTER — Encounter (HOSPITAL_COMMUNITY): Payer: Self-pay | Admitting: Psychiatry

## 2023-05-10 DIAGNOSIS — F321 Major depressive disorder, single episode, moderate: Secondary | ICD-10-CM

## 2023-05-10 DIAGNOSIS — F411 Generalized anxiety disorder: Secondary | ICD-10-CM

## 2023-05-10 MED ORDER — DESVENLAFAXINE SUCCINATE ER 50 MG PO TB24
50.0000 mg | ORAL_TABLET | Freq: Every day | ORAL | 0 refills | Status: DC
Start: 2023-05-10 — End: 2023-07-12

## 2023-05-10 MED ORDER — DESVENLAFAXINE SUCCINATE ER 25 MG PO TB24
25.0000 mg | ORAL_TABLET | Freq: Every day | ORAL | 0 refills | Status: DC
Start: 2023-05-10 — End: 2023-07-12

## 2023-05-10 MED ORDER — TRAZODONE HCL 50 MG PO TABS
25.0000 mg | ORAL_TABLET | Freq: Every day | ORAL | 90 refills | Status: DC
Start: 2023-05-10 — End: 2023-07-12

## 2023-05-30 ENCOUNTER — Ambulatory Visit (INDEPENDENT_AMBULATORY_CARE_PROVIDER_SITE_OTHER): Payer: Medicaid Other | Admitting: Family Medicine

## 2023-05-30 ENCOUNTER — Encounter: Payer: Self-pay | Admitting: Family Medicine

## 2023-05-30 VITALS — BP 126/82 | HR 84 | Temp 98.0°F | Ht 64.0 in | Wt 134.0 lb

## 2023-05-30 DIAGNOSIS — E7800 Pure hypercholesterolemia, unspecified: Secondary | ICD-10-CM | POA: Diagnosis not present

## 2023-05-30 DIAGNOSIS — Z0001 Encounter for general adult medical examination with abnormal findings: Secondary | ICD-10-CM

## 2023-05-30 DIAGNOSIS — I10 Essential (primary) hypertension: Secondary | ICD-10-CM

## 2023-05-30 DIAGNOSIS — Z0000 Encounter for general adult medical examination without abnormal findings: Secondary | ICD-10-CM

## 2023-05-30 MED ORDER — AZITHROMYCIN 250 MG PO TABS: ORAL_TABLET | ORAL | 0 refills | Status: AC

## 2023-05-30 NOTE — Progress Notes (Signed)
Subjective:    Patient ID: Rhonda [name], female    DOB: March 12, 1961, 62 y.o.   MRN: [id number]  HPI Patient is a very pleasant 62 year old female here today for her physical exam.  When I saw her last year she weighed more than 200 pounds.  She is lost considerable weight, more than 60 pounds.  I am very proud of her.  Her blood pressure is outstanding.  Her mammogram is up-to-date.  She is due for a colonoscopy next year.  She has a history of a hysterectomy and therefore does not require Pap smear.  Her flu shot is up-to-date.  Her shingles vaccine is up-to-date.  She has had her COVID-vaccine.  She does report a 5-day history of rhinorrhea, postnasal drip, and scratchy throat.  She denies any sinus pain.  Past Medical History:  Diagnosis Date   Arthritis    knees   Carpal tunnel syndrome of left wrist 07/2016   Depression    History of MRSA infection > 10 years   right leg   Hyperlipidemia    Hypertension    states under control with meds., has been on med. > 2 yr.   OSA (obstructive sleep apnea) 09/05/2019   Past Surgical History:  Procedure Laterality Date   ABDOMINAL HYSTERECTOMY     partial   BREAST EXCISIONAL BIOPSY     CARPAL TUNNEL RELEASE Left 08/26/2016   Procedure: LEFT WRIST CARPAL TUNNEL RELEASE;  Surgeon: Loreta Ave, MD;  Location: [facility];  Service: Orthopedics;  Laterality: Left;   COLONOSCOPY WITH PROPOFOL  03/20/2014   LAPAROSCOPIC UNILATERAL SALPINGECTOMY Right 05/18/2002   OVARY BIOPSY Right 05/18/2002   TRIGGER FINGER RELEASE Left 02/10/2017   Procedure: LEFT TRIGGER THUMB RELEASE (TENDON SHEATH INCISION);  Surgeon: Loreta Ave, MD;  Location: [facility];  Service: Orthopedics;  Laterality: Left;   Current Outpatient Medications on File Prior to Visit  Medication Sig Dispense Refill   Cholecalciferol (DIALYVITE VITAMIN D 5000) 125 MCG (5000 UT) capsule Take 2 capsules (10,000 Units total) by mouth daily.      desvenlafaxine (PRISTIQ) 25 MG 24 hr tablet Take 1 tablet (25 mg total) by mouth daily. Take with a 50 mg tab for a total of 75 mg daily 90 tablet 0   desvenlafaxine (PRISTIQ) 50 MG 24 hr tablet Take 1 tablet (50 mg total) by mouth daily. Take with a 25 mg tab for a total of 75 mg daily 90 tablet 0   ezetimibe (ZETIA) 10 MG tablet Take 1 tablet (10 mg total) by mouth daily. 90 tablet 0   fluticasone (FLONASE) 50 MCG/ACT nasal spray Place into both nostrils as needed.      mometasone (ELOCON) 0.1 % cream Apply topically daily. 15 g 1   montelukast (SINGULAIR) 10 MG tablet TAKE 1 TABLET BY MOUTH EVERYDAY AT BEDTIME 90 tablet 3   Multiple Vitamins-Iron (DAILY VITAMIN/IRON) TABS Chelate Iron     omeprazole (PRILOSEC) 40 MG capsule Take 40 mg by mouth daily.     rizatriptan (MAXALT-MLT) 5 MG disintegrating tablet Take 1 tablet (5 mg total) by mouth as needed. May repeat in 2 hours if needed 10 tablet 6   topiramate (TOPAMAX) 25 MG tablet Take 2 tablets (50 mg total) by mouth at bedtime. 60 tablet 11   valsartan (DIOVAN) 160 MG tablet Take 1 tablet (160 mg total) by mouth daily. 30 tablet 5   traZODone (DESYREL) 50 MG tablet Take 0.5-1 tablets (25-50  mg total) by mouth at bedtime. (Patient not taking: Reported on 05/30/2023) 90 tablet 90   No current facility-administered medications on file prior to visit.   Allergies  Allergen Reactions   Penicillins Hives         Statins Other (See Comments)    MYALGIAS   Lisinopril Cough   Social History   Socioeconomic History   Marital status: Single    Spouse name: Not on file   Number of children: Not on file   Years of education: Not on file   Highest education level: Associate degree: academic program  Occupational History   Not on file  Tobacco Use   Smoking status: Never   Smokeless tobacco: Never  Substance and Sexual Activity   Alcohol use: No   Drug use: No   Sexual activity: Not on file  Other Topics Concern   Not on file  Social  History Narrative   Left handed   Caffeine- 2 daily   Social Determinants of Health   Financial Resource Strain: Patient Declined (05/05/2023)   Overall Financial Resource Strain (CARDIA)    Difficulty of Paying Living Expenses: Patient declined  Food Insecurity: Patient Declined (05/05/2023)   Hunger Vital Sign    Worried About Running Out of Food in the Last Year: Patient declined    Ran Out of Food in the Last Year: Patient declined  Transportation Needs: Patient Declined (05/05/2023)   PRAPARE - Administrator, Civil Service (Medical): Patient declined    Lack of Transportation (Non-Medical): Patient declined  Physical Activity: Unknown (05/05/2023)   Exercise Vital Sign    Days of Exercise per Week: Patient declined    Minutes of Exercise per Session: 30 min  Stress: Patient Declined (05/05/2023)   [employer] of Occupational Health - Occupational Stress Questionnaire    Feeling of Stress : Patient declined  Social Connections: Unknown (05/05/2023)   Social Connection and Isolation Panel [NHANES]    Frequency of Communication with Friends and Family: Patient declined    Frequency of Social Gatherings with Friends and Family: Patient declined    Attends Religious Services: Patient declined    Database administrator or Organizations: Patient declined    Attends Engineer, structural: More than 4 times per year    Marital Status: Patient declined  Intimate Partner Violence: Not At Risk (02/24/2023)   Received from [facility]   HITS    Over the last 12 months how often did your partner physically hurt you?: Never    Over the last 12 months how often did your partner insult you or talk down to you?: Never    Over the last 12 months how often did your partner threaten you with physical harm?: Never    Over the last 12 months how often did your partner scream or curse at you?: Never   Family History  Problem Relation Age of Onset   Breast cancer Maternal  Aunt      Review of Systems  All other systems reviewed and are negative.      Objective:   Physical Exam Vitals reviewed.  Constitutional:      General: She is not in acute distress.    Appearance: Normal appearance. She is obese. She is not ill-appearing, toxic-appearing or diaphoretic.  HENT:     Head: Normocephalic and atraumatic.     Right Ear: Tympanic membrane, ear canal and external ear normal. There is no impacted cerumen.  Left Ear: Tympanic membrane, ear canal and external ear normal. There is no impacted cerumen.     Nose: Nose normal. No congestion or rhinorrhea.     Mouth/Throat:     Mouth: Mucous membranes are moist.     Pharynx: No oropharyngeal exudate or posterior oropharyngeal erythema.  Eyes:     General: No scleral icterus.       Right eye: No discharge.        Left eye: No discharge.     Extraocular Movements: Extraocular movements intact.     Conjunctiva/sclera: Conjunctivae normal.     Pupils: Pupils are equal, round, and reactive to light.  Neck:     Vascular: No carotid bruit.  Cardiovascular:     Rate and Rhythm: Normal rate and regular rhythm.     Pulses: Normal pulses.     Heart sounds: Normal heart sounds. No murmur heard.    No friction rub. No gallop.  Pulmonary:     Effort: Pulmonary effort is normal. No respiratory distress.     Breath sounds: Normal breath sounds. No stridor. No wheezing, rhonchi or rales.  Chest:     Chest wall: No tenderness.  Abdominal:     General: Abdomen is flat. There is no distension.     Palpations: Abdomen is soft. There is no mass.     Tenderness: There is no abdominal tenderness. There is no right CVA tenderness, left CVA tenderness, guarding or rebound.     Hernia: No hernia is present.  Musculoskeletal:        General: Normal range of motion.     Cervical back: Normal range of motion and neck supple. No rigidity or tenderness.     Right lower leg: No edema.     Left lower leg: No edema.   Lymphadenopathy:     Cervical: No cervical adenopathy.  Skin:    General: Skin is warm.     Coloration: Skin is not jaundiced or pale.     Findings: No bruising, erythema, lesion or rash.  Neurological:     General: No focal deficit present.     Mental Status: She is alert and oriented to person, place, and time. Mental status is at baseline.     Cranial Nerves: No cranial nerve deficit.     Sensory: No sensory deficit.     Motor: No weakness.     Coordination: Coordination normal.     Gait: Gait normal.     Deep Tendon Reflexes: Reflexes normal.  Psychiatric:        Mood and Affect: Mood normal.        Behavior: Behavior normal.        Thought Content: Thought content normal.        Judgment: Judgment normal.           Assessment & Plan:  Pure hypercholesterolemia - Plan: Lipid panel  Benign essential HTN  General medical exam I reviewed the patient's lab work from November 7.  It looks excellent.  She is due for a fasting lipid panel at her earliest convenience.  I am extremely proud of her for her weight loss.  Her blood pressure is well-controlled.  Her immunizations are up-to-date.  She is due for colonoscopy in 2025.  Mammogram is up-to-date.  I believe that she has a viral upper respiratory infection.  I recommended and Mucinex and tincture of time.  If she develops a sinus infection I will treat this with a Z-Pak.

## 2023-06-09 DIAGNOSIS — Z903 Acquired absence of stomach [part of]: Secondary | ICD-10-CM | POA: Diagnosis not present

## 2023-06-09 DIAGNOSIS — Z9884 Bariatric surgery status: Secondary | ICD-10-CM | POA: Diagnosis not present

## 2023-06-09 DIAGNOSIS — I10 Essential (primary) hypertension: Secondary | ICD-10-CM | POA: Diagnosis not present

## 2023-06-09 DIAGNOSIS — E782 Mixed hyperlipidemia: Secondary | ICD-10-CM | POA: Diagnosis not present

## 2023-06-09 DIAGNOSIS — K912 Postsurgical malabsorption, not elsewhere classified: Secondary | ICD-10-CM | POA: Diagnosis not present

## 2023-06-16 DIAGNOSIS — S61216A Laceration without foreign body of right little finger without damage to nail, initial encounter: Secondary | ICD-10-CM | POA: Diagnosis not present

## 2023-06-20 DIAGNOSIS — S65516D Laceration of blood vessel of right little finger, subsequent encounter: Secondary | ICD-10-CM | POA: Diagnosis not present

## 2023-07-11 NOTE — Progress Notes (Signed)
 BH MD/PA/NP OP Progress Note  07/12/2023 12:09 PM Rhonda [name]  MRN:  [id number]  Visit Diagnosis:    ICD-10-CM   1. GAD (generalized anxiety disorder)  F41.1 desvenlafaxine  (PRISTIQ ) 25 MG 24 hr tablet    desvenlafaxine  (PRISTIQ ) 50 MG 24 hr tablet    2. Depression, major, single episode, moderate (HCC)  F32.1 traZODone  (DESYREL ) 50 MG tablet    desvenlafaxine  (PRISTIQ ) 25 MG 24 hr tablet    desvenlafaxine  (PRISTIQ ) 50 MG 24 hr tablet       Assessment: Rhonda [name] is a 63 y.o. female with a history of depression, obstructive sleep apnea who presented to [facility] for initial evaluation on 04/15/22.    During initial evaluation patient reported symptoms of anxiety, increased irritability, increased fatigue, decreased concentration, low mood, and increased need to sleep.  Patient reported that her symptoms tend to be worse in the fall and winter.  She denied any suicidal ideation or thoughts of self-harm along with any history of psychosis, mania, paranoia, or delusions.  Of note patient has sleep apnea which is well-controlled with CPAP, and vitamin D  deficiency which is well-controlled.  Patient's symptoms meet criteria for MDD at time of initial evaluation.  Following patient's gastric bypass she has been unable to tolerate medications and was noted to be experiencing increased symptoms of anxiety including excessive worry, restlessness, increased irritability, and feeling overwhelmed.  With the symptoms she had criteria for generalized anxiety disorder.  Rhonda [name] presents for follow-up evaluation. Today, 07/12/23, patient reports that she has had an increase in depression.  He endorsed low mood, anhedonia, amotivation, insomnia, and fatigue.  Of note this appears to be seasonal in nature as patient has experienced similar symptoms in the past.  The insomnia had improved with trazodone  though restarted after patient discontinued.  She  was encouraged to restart the trazodone  today as she had not been experiencing any adverse side effects from it.  We also discussed options for seasonal depression including medication titration and light therapy.  Patient was more interested in light therapy at this time though will consider medication adjustments in the future if needed.  Provided information on the appropriate light for light therapy and will follow up in 2 months.  Plan: - Continue Pristiq  75 mg every day - Continue Trazodone  25-50 mg at bedtime prn for insomnia - Continue Vit D 10000 units daily managed by PCP - Recommended light therapy with 10000 Lux light - CMP, CBC, lipid profile, Vit D, TSH, A1c reviewd - Can consider therapy in the future if needed - Follow up in 2 months  Chief Complaint:  Chief Complaint  Patient presents with   Follow-up   HPI: Rhonda [name] presents reporting that she is feeling rather blah today.  She had a really nice holiday with her family. All her family leaving is a bit depressing. January is already a hard month as her dad committed suicide at the end of the month. She tries not to think about things but her mind shifts back during this time.  On review Rhonda [name] reports that this is a seasonal pattern for her in the winter months she does typically get more depressed.  She tries to get through and often succeeds but can notice a significant decrease in her energy levels, motivation, and overall mood during this period.  In regards to sleep patient reports that it had improved on the trazodone  and thus she stopped taking it however shortly after  discontinuing she found that she had difficulty sleeping through the night again.  Based on patient's report we did recommend taking trazodone  more consistently and reviewed that she might not need it as frequently once the winter months start to fade.  As for the seasonal depression we discussed options of temporary medication increase versus light therapy.   Rhonda [name] was most interested in light therapy and was given information in regards to that.  It was explained that she should use a 10,000 Lux light for 30 minutes every morning.  The light should be roughly the size of a piece of paper and should be spaced about 5 feet away from her.  Past Psychiatric History: Patient reports that her psychiatric care started in 1991 following her father's suicide.  She was diagnosed with depression and seasonal affective disorder.  Patient was tried on a number of medications including Cymbalta , Paxil , Zoloft, Lexapro, Wellbutrin  XL 300 mg (worked well in the past with Pristiq ), and Pristiq .  She denies trying any mood stabilizers or antipsychotics.  Patient notes that she is prescribed gabapentin  for pain and it made her feel a bit loopy.  Patient attended therapy after separating for her husband and reports it was helpful at the time.  She denies any SI/HI or thoughts of self-harm or any prior suicide attempts as well as any past psychiatric hospitalizations.  Patient denies any substance use.  Past Medical History:  Past Medical History:  Diagnosis Date   Arthritis    knees   Carpal tunnel syndrome of left wrist 07/2016   Depression    History of MRSA infection > 10 years   right leg   Hyperlipidemia    Hypertension    states under control with meds., has been on med. > 2 yr.   OSA (obstructive sleep apnea) 09/05/2019    Past Surgical History:  Procedure Laterality Date   ABDOMINAL HYSTERECTOMY     partial   BREAST EXCISIONAL BIOPSY     CARPAL TUNNEL RELEASE Left 08/26/2016   Procedure: LEFT WRIST CARPAL TUNNEL RELEASE;  Surgeon: Toribio JULIANNA Chancy, MD;  Location: [facility];  Service: Orthopedics;  Laterality: Left;   COLONOSCOPY WITH PROPOFOL   03/20/2014   LAPAROSCOPIC UNILATERAL SALPINGECTOMY Right 05/18/2002   OVARY BIOPSY Right 05/18/2002   TRIGGER FINGER RELEASE Left 02/10/2017   Procedure: LEFT TRIGGER THUMB RELEASE (TENDON SHEATH  INCISION);  Surgeon: Chancy Toribio JULIANNA, MD;  Location: [facility];  Service: Orthopedics;  Laterality: Left;    Family Psychiatric History: Patient reports that her father had depression and committed suicide in 43.  Family History:  Family History  Problem Relation Age of Onset   Breast cancer Maternal Aunt     Social History:  Social History   Socioeconomic History   Marital status: Single    Spouse name: Not on file   Number of children: Not on file   Years of education: Not on file   Highest education level: Associate degree: academic program  Occupational History   Not on file  Tobacco Use   Smoking status: Never   Smokeless tobacco: Never  Substance and Sexual Activity   Alcohol use: No   Drug use: No   Sexual activity: Not on file  Other Topics Concern   Not on file  Social History Narrative   Left handed   Caffeine- 2 daily   Social Drivers of Health   Financial Resource Strain: Patient Declined (05/05/2023)   Overall Financial Resource Strain (CARDIA)  Difficulty of Paying Living Expenses: Patient declined  Food Insecurity: Patient Declined (05/05/2023)   Hunger Vital Sign    Worried About Running Out of Food in the Last Year: Patient declined    Ran Out of Food in the Last Year: Patient declined  Transportation Needs: Patient Declined (05/05/2023)   PRAPARE - Administrator, Civil Service (Medical): Patient declined    Lack of Transportation (Non-Medical): Patient declined  Physical Activity: Unknown (05/05/2023)   Exercise Vital Sign    Days of Exercise per Week: Patient declined    Minutes of Exercise per Session: 30 min  Stress: Patient Declined (05/05/2023)   [employer] of Occupational Health - Occupational Stress Questionnaire    Feeling of Stress : Patient declined  Social Connections: Unknown (05/05/2023)   Social Connection and Isolation Panel [NHANES]    Frequency of Communication with Friends and Family:  Patient declined    Frequency of Social Gatherings with Friends and Family: Patient declined    Attends Religious Services: Patient declined    Database Administrator or Organizations: Patient declined    Attends Engineer, Structural: More than 4 times per year    Marital Status: Patient declined    Allergies:  Allergies  Allergen Reactions   Penicillins Hives         Statins Other (See Comments)    MYALGIAS   Lisinopril Cough    Current Medications: Current Outpatient Medications  Medication Sig Dispense Refill   azithromycin  (ZITHROMAX ) 250 MG tablet 2 tabs poqday1, 1 tab poqday 2-5 6 tablet 0   Cholecalciferol (DIALYVITE  VITAMIN D  5000) 125 MCG (5000 UT) capsule Take 2 capsules (10,000 Units total) by mouth daily.     desvenlafaxine  (PRISTIQ ) 25 MG 24 hr tablet Take 1 tablet (25 mg total) by mouth daily. Take with a 50 mg tab for a total of 75 mg daily 90 tablet 0   desvenlafaxine  (PRISTIQ ) 50 MG 24 hr tablet Take 1 tablet (50 mg total) by mouth daily. Take with a 25 mg tab for a total of 75 mg daily 90 tablet 0   ezetimibe  (ZETIA ) 10 MG tablet Take 1 tablet (10 mg total) by mouth daily. 90 tablet 0   fluticasone (FLONASE) 50 MCG/ACT nasal spray Place into both nostrils as needed.      mometasone  (ELOCON ) 0.1 % cream Apply topically daily. 15 g 1   montelukast  (SINGULAIR ) 10 MG tablet TAKE 1 TABLET BY MOUTH EVERYDAY AT BEDTIME 90 tablet 3   Multiple Vitamins-Iron  (DAILY VITAMIN/IRON ) TABS Chelate Iron      omeprazole (PRILOSEC) 40 MG capsule Take 40 mg by mouth daily.     rizatriptan  (MAXALT -MLT) 5 MG disintegrating tablet Take 1 tablet (5 mg total) by mouth as needed. May repeat in 2 hours if needed 10 tablet 6   topiramate  (TOPAMAX ) 25 MG tablet Take 2 tablets (50 mg total) by mouth at bedtime. 60 tablet 11   traZODone  (DESYREL ) 50 MG tablet Take 0.5-1 tablets (25-50 mg total) by mouth at bedtime. 90 tablet 0   valsartan  (DIOVAN ) 160 MG tablet Take 1 tablet (160 mg  total) by mouth daily. 30 tablet 5   No current facility-administered medications for this visit.     Psychiatric Specialty Exam: Review of Systems  There were no vitals taken for this visit.There is no height or weight on file to calculate BMI.  General Appearance: Fairly Groomed  Eye Contact:  Good  Speech:  Clear and Coherent and Normal  Rate  Volume:  Normal  Mood:  Depressed  Affect:  Congruent  Thought Process:  Coherent, Goal Directed, and Linear  Orientation:  Full (Time, Place, and Person)  Thought Content: Logical   Suicidal Thoughts:  No  Homicidal Thoughts:  No  Memory:  NA  Judgement:  Good  Insight:  Good  Psychomotor Activity:  Normal  Concentration:  Concentration: Good  Recall:  Good  Fund of Knowledge: Good  Language: Good  Akathisia:  NA    AIMS (if indicated): not done  Assets:  Communication Skills Desire for Improvement Housing Leisure Time Physical Health Resilience Social Support Talents/Skills Transportation Vocational/Educational  ADL's:  Intact  Cognition: WNL  Sleep:  Fair   Metabolic Disorder Labs: Lab Results  Component Value Date   HGBA1C 5.9 (H) 04/08/2022   MPG 123 04/08/2022   MPG 114 08/06/2019   No results found for: PROLACTIN Lab Results  Component Value Date   CHOL 212 (H) 05/07/2022   TRIG 70 05/07/2022   HDL 67 05/07/2022   CHOLHDL 3.2 05/07/2022   VLDL 17 08/02/2016   LDLCALC 129 (H) 05/07/2022   LDLCALC 142 (H) 10/30/2021   Lab Results  Component Value Date   TSH 0.45 09/17/2022   TSH 0.82 10/30/2021    Therapeutic Level Labs: No results found for: LITHIUM No results found for: VALPROATE No results found for: CBMZ   Screenings: GAD-7    Flowsheet Row Office Visit from 03/08/2023 in [facility] Office Visit from 05/25/2022 in [facility] Office Visit from 05/07/2022 in [facility] Office Visit from 10/29/2019  in [facility] Office Visit from 08/06/2019 in [facility]  Total GAD-7 Score 6 0 0 9 21      PHQ2-9    Flowsheet Row Office Visit from 05/30/2023 in [facility] Office Visit from 05/05/2023 in [facility] Office Visit from 03/08/2023 in [facility] Office Visit from 05/25/2022 in [facility] Office Visit from 05/07/2022 in [facility]  PHQ-2 Total Score 0 0 0 0 0  PHQ-9 Total Score -- -- -- 0 1       Collaboration of Care: Collaboration of Care: Medication Management AEB medication prescription and Other provider involved in patient's care AEB PCP chart review  Patient/Guardian was advised Release of Information must be obtained prior to any record release in order to collaborate their care with an outside provider. Patient/Guardian was advised if they have not already done so to contact the registration department to sign all necessary forms in order for us  to release information regarding their care.   Consent: Patient/Guardian gives verbal consent for treatment and assignment of benefits for services provided during this visit. Patient/Guardian expressed understanding and agreed to proceed.    Rhonda CHRISTELLA Finder, MD 07/12/2023, 12:09 PM   Virtual Visit via Video Note  I connected with Rhonda [name] on 07/12/23 at 11:00 AM EST by a video enabled telemedicine application and verified that I am speaking with the correct person using two identifiers.  Location: Patient: Home Provider: Home Office   I discussed the limitations of evaluation and management by telemedicine and the availability of in person appointments. The patient expressed understanding and agreed to proceed.   I discussed the assessment and treatment plan with the patient. The patient was provided an  opportunity to ask questions and all were  answered. The patient agreed with the plan and demonstrated an understanding of the instructions.   The patient was advised to call back or seek an in-person evaluation if the symptoms worsen or if the condition fails to improve as anticipated.  I provided 10 minutes of non-face-to-face time during this encounter.   Rhonda CHRISTELLA Finder, MD

## 2023-07-12 ENCOUNTER — Encounter (HOSPITAL_COMMUNITY): Payer: Self-pay | Admitting: Psychiatry

## 2023-07-12 ENCOUNTER — Telehealth (HOSPITAL_COMMUNITY): Payer: Medicaid Other | Admitting: Psychiatry

## 2023-07-12 DIAGNOSIS — F411 Generalized anxiety disorder: Secondary | ICD-10-CM

## 2023-07-12 DIAGNOSIS — F321 Major depressive disorder, single episode, moderate: Secondary | ICD-10-CM | POA: Diagnosis not present

## 2023-07-12 MED ORDER — DESVENLAFAXINE SUCCINATE ER 50 MG PO TB24
50.0000 mg | ORAL_TABLET | Freq: Every day | ORAL | 0 refills | Status: DC
Start: 2023-07-12 — End: 2023-09-08

## 2023-07-12 MED ORDER — TRAZODONE HCL 50 MG PO TABS: 25.0000 mg | ORAL_TABLET | Freq: Every day | ORAL | 0 refills | Status: DC

## 2023-07-12 MED ORDER — DESVENLAFAXINE SUCCINATE ER 25 MG PO TB24
25.0000 mg | ORAL_TABLET | Freq: Every day | ORAL | 0 refills | Status: DC
Start: 2023-07-12 — End: 2023-09-08

## 2023-07-22 ENCOUNTER — Encounter (HOSPITAL_COMMUNITY): Payer: Self-pay

## 2023-07-23 DIAGNOSIS — R21 Rash and other nonspecific skin eruption: Secondary | ICD-10-CM | POA: Diagnosis not present

## 2023-07-23 DIAGNOSIS — L209 Atopic dermatitis, unspecified: Secondary | ICD-10-CM | POA: Diagnosis not present

## 2023-07-29 ENCOUNTER — Ambulatory Visit: Payer: Medicaid Other | Admitting: Family Medicine

## 2023-07-29 MED ORDER — DOXEPIN HCL 10 MG PO CAPS: 10.0000 mg | ORAL_CAPSULE | Freq: Every day | ORAL | 1 refills | Status: DC

## 2023-08-01 ENCOUNTER — Ambulatory Visit: Payer: Medicaid Other | Admitting: Family Medicine

## 2023-08-01 ENCOUNTER — Encounter: Payer: Self-pay | Admitting: Family Medicine

## 2023-08-01 VITALS — BP 130/78 | HR 76 | Temp 98.7°F | Ht 64.0 in | Wt 133.2 lb

## 2023-08-01 DIAGNOSIS — L209 Atopic dermatitis, unspecified: Secondary | ICD-10-CM

## 2023-08-01 MED ORDER — TRIAMCINOLONE ACETONIDE 0.1 % EX CREA: 1.0000 | TOPICAL_CREAM | Freq: Two times a day (BID) | CUTANEOUS | 0 refills | Status: AC

## 2023-08-01 NOTE — Progress Notes (Signed)
Subjective:    Patient ID: Rhonda [name], female    DOB: 1961/03/24, 63 y.o.   MRN: [id number]  HPI Patient is a 63 year old African-American female who presents with a several week history of a rash on her right forehead.  The rash is roughly 5 cm and elliptical in shape.  It is dry scaly skin with poorly demarcated borders.  She has been treating it with tretinoin cream twice daily.  She was supposed to get triamcinolone cream at an urgent care however she never started the triamcinolone cream but mistakenly started applying tretinoin cream Past Medical History:  Diagnosis Date   Arthritis    knees   Carpal tunnel syndrome of left wrist 07/2016   Depression    History of MRSA infection > 10 years   right leg   Hyperlipidemia    Hypertension    states under control with meds., has been on med. > 2 yr.   OSA (obstructive sleep apnea) 09/05/2019   Past Surgical History:  Procedure Laterality Date   ABDOMINAL HYSTERECTOMY     partial   BREAST EXCISIONAL BIOPSY     CARPAL TUNNEL RELEASE Left 08/26/2016   Procedure: LEFT WRIST CARPAL TUNNEL RELEASE;  Surgeon: Loreta Ave, MD;  Location: [facility];  Service: Orthopedics;  Laterality: Left;   COLONOSCOPY WITH PROPOFOL  03/20/2014   LAPAROSCOPIC UNILATERAL SALPINGECTOMY Right 05/18/2002   OVARY BIOPSY Right 05/18/2002   TRIGGER FINGER RELEASE Left 02/10/2017   Procedure: LEFT TRIGGER THUMB RELEASE (TENDON SHEATH INCISION);  Surgeon: Loreta Ave, MD;  Location: [facility];  Service: Orthopedics;  Laterality: Left;   Current Outpatient Medications on File Prior to Visit  Medication Sig Dispense Refill   Cholecalciferol (DIALYVITE VITAMIN D 5000) 125 MCG (5000 UT) capsule Take 2 capsules (10,000 Units total) by mouth daily.     desvenlafaxine (PRISTIQ) 25 MG 24 hr tablet Take 1 tablet (25 mg total) by mouth daily. Take with a 50 mg tab for a total of 75 mg daily 90 tablet 0   desvenlafaxine  (PRISTIQ) 50 MG 24 hr tablet Take 1 tablet (50 mg total) by mouth daily. Take with a 25 mg tab for a total of 75 mg daily 90 tablet 0   doxepin (SINEQUAN) 10 MG capsule Take 1 capsule (10 mg total) by mouth at bedtime. 30 capsule 1   ezetimibe (ZETIA) 10 MG tablet Take 1 tablet (10 mg total) by mouth daily. 90 tablet 0   fluticasone (FLONASE) 50 MCG/ACT nasal spray Place into both nostrils as needed.      mometasone (ELOCON) 0.1 % cream Apply topically daily. 15 g 1   montelukast (SINGULAIR) 10 MG tablet TAKE 1 TABLET BY MOUTH EVERYDAY AT BEDTIME 90 tablet 3   Multiple Vitamins-Iron (DAILY VITAMIN/IRON) TABS Chelate Iron     omeprazole (PRILOSEC) 40 MG capsule Take 40 mg by mouth daily.     rizatriptan (MAXALT-MLT) 5 MG disintegrating tablet Take 1 tablet (5 mg total) by mouth as needed. May repeat in 2 hours if needed 10 tablet 6   topiramate (TOPAMAX) 25 MG tablet Take 2 tablets (50 mg total) by mouth at bedtime. 60 tablet 11   traZODone (DESYREL) 50 MG tablet Take 0.5-1 tablets (25-50 mg total) by mouth at bedtime. 90 tablet 0   valsartan (DIOVAN) 160 MG tablet Take 1 tablet (160 mg total) by mouth daily. 30 tablet 5   azithromycin (ZITHROMAX) 250 MG tablet 2 tabs poqday1, 1  tab poqday 2-5 (Patient not taking: Reported on 08/01/2023) 6 tablet 0   No current facility-administered medications on file prior to visit.   Allergies  Allergen Reactions   Penicillins Hives         Statins Other (See Comments)    MYALGIAS   Lisinopril Cough   Social History   Socioeconomic History   Marital status: Single    Spouse name: Not on file   Number of children: Not on file   Years of education: Not on file   Highest education level: Associate degree: academic program  Occupational History   Not on file  Tobacco Use   Smoking status: Never   Smokeless tobacco: Never  Substance and Sexual Activity   Alcohol use: No   Drug use: No   Sexual activity: Not on file  Other Topics Concern   Not  on file  Social History Narrative   Left handed   Caffeine- 2 daily   Social Drivers of Health   Financial Resource Strain: Patient Declined (07/29/2023)   Overall Financial Resource Strain (CARDIA)    Difficulty of Paying Living Expenses: Patient declined  Food Insecurity: Patient Declined (07/29/2023)   Hunger Vital Sign    Worried About Running Out of Food in the Last Year: Patient declined    Ran Out of Food in the Last Year: Patient declined  Transportation Needs: Patient Declined (07/29/2023)   PRAPARE - Administrator, Civil Service (Medical): Patient declined    Lack of Transportation (Non-Medical): Patient declined  Physical Activity: Unknown (07/29/2023)   Exercise Vital Sign    Days of Exercise per Week: Patient declined    Minutes of Exercise per Session: 30 min  Stress: Patient Declined (07/29/2023)   [employer] of Occupational Health - Occupational Stress Questionnaire    Feeling of Stress : Patient declined  Social Connections: Unknown (07/29/2023)   Social Connection and Isolation Panel [NHANES]    Frequency of Communication with Friends and Family: Patient declined    Frequency of Social Gatherings with Friends and Family: Patient declined    Attends Religious Services: Patient declined    Database administrator or Organizations: Patient declined    Attends Engineer, structural: More than 4 times per year    Marital Status: Patient declined  Intimate Partner Violence: Not At Risk (02/24/2023)   Received from [facility]   HITS    Over the last 12 months how often did your partner physically hurt you?: Never    Over the last 12 months how often did your partner insult you or talk down to you?: Never    Over the last 12 months how often did your partner threaten you with physical harm?: Never    Over the last 12 months how often did your partner scream or curse at you?: Never   Family History  Problem Relation Age of Onset   Breast  cancer Maternal Aunt      Review of Systems  All other systems reviewed and are negative.      Objective:   Physical Exam Vitals reviewed.  Constitutional:      General: She is not in acute distress.    Appearance: Normal appearance. She is obese. She is not ill-appearing, toxic-appearing or diaphoretic.  HENT:     Head: Normocephalic and atraumatic.   Eyes:     General: Lids are normal.     Extraocular Movements: Extraocular movements intact.     Conjunctiva/sclera:  Conjunctivae normal.  Neck:     Vascular: No carotid bruit.  Cardiovascular:     Rate and Rhythm: Normal rate and regular rhythm.     Pulses: Normal pulses.     Heart sounds: Normal heart sounds. No murmur heard.    No friction rub. No gallop.  Pulmonary:     Effort: Pulmonary effort is normal. No respiratory distress.     Breath sounds: Normal breath sounds. No stridor. No wheezing, rhonchi or rales.  Chest:     Chest wall: No tenderness.  Musculoskeletal:     Cervical back: Normal range of motion and neck supple. No rigidity or tenderness.  Lymphadenopathy:     Cervical: No cervical adenopathy.  Skin:    Findings: Rash present. Rash is macular and scaling. Rash is not papular, pustular, urticarial or vesicular.  Neurological:     General: No focal deficit present.     Mental Status: She is alert and oriented to person, place, and time. Mental status is at baseline.     Cranial Nerves: No cranial nerve deficit, dysarthria or facial asymmetry.     Sensory: Sensation is intact. No sensory deficit.     Motor: Motor function is intact. No weakness.     Coordination: Coordination is intact. Coordination normal.     Gait: Gait normal.     Deep Tendon Reflexes: Reflexes normal.         Assessment & Plan:  Atopic dermatitis, unspecified type Discontinue tretinoin cream immediately.  I feel that this is likely exacerbating the rash.  Start with triamcinolone cream twice daily for 10 days and I suspect the  rash will clear.  Explained to the patient to use this sparingly only at this time due to the severity of the rash

## 2023-08-05 ENCOUNTER — Ambulatory Visit: Payer: Medicaid Other | Admitting: Family Medicine

## 2023-08-23 ENCOUNTER — Other Ambulatory Visit (HOSPITAL_COMMUNITY): Payer: Self-pay | Admitting: Psychiatry

## 2023-09-05 NOTE — Progress Notes (Unsigned)
 BH MD/PA/NP OP Progress Note  09/08/2023 11:29 AM Rhonda [name]  MRN:  [id number]  Visit Diagnosis:    ICD-10-CM   1. GAD (generalized anxiety disorder)  F41.1 desvenlafaxine (PRISTIQ) 25 MG 24 hr tablet    desvenlafaxine (PRISTIQ) 50 MG 24 hr tablet    2. Depression, major, single episode, moderate (HCC)  F32.1 desvenlafaxine (PRISTIQ) 25 MG 24 hr tablet    desvenlafaxine (PRISTIQ) 50 MG 24 hr tablet      Assessment: Rhonda [name] is a 63 y.o. female with a history of depression, obstructive sleep apnea who presented to [facility] for initial evaluation on 04/15/22.    During initial evaluation patient reported symptoms of anxiety, increased irritability, increased fatigue, decreased concentration, low mood, and increased need to sleep.  Patient reported that her symptoms tend to be worse in the fall and winter.  She denied any suicidal ideation or thoughts of self-harm along with any history of psychosis, mania, paranoia, or delusions.  Of note patient has sleep apnea which is well-controlled with CPAP, and vitamin D deficiency which is well-controlled.  Patient's symptoms meet criteria for MDD at time of initial evaluation.  Following patient's gastric bypass she has been unable to tolerate medications and was noted to be experiencing increased symptoms of anxiety including excessive worry, restlessness, increased irritability, and feeling overwhelmed.  With the symptoms she had criteria for generalized anxiety disorder.  Rhonda [name] presents for follow-up evaluation. Today, 09/08/23, patient reports that her mood has improved and she has increased her behavioral activation. She did start light therapy and has found it to have some benefit. Patient was unable to tolerate the trazodone due to headaches and it was discontinued in the interim after she spoke with this provider. She has since started melatonin which has improved her sleep. We  will continue on her current regimen and follow up in 2 months.   Plan: - Continue Pristiq 75 mg every day - Discontinue Trazodone 25-50 mg at bedtime prn for insomnia - Continue Vit D 10000 units daily managed by PCP - Patient started light therapy with [id number] Lux light - CMP, CBC, lipid profile, Vit D, TSH, A1c reviewd - Can consider therapy in the future if needed - Follow up in 2 months  Chief Complaint:  Chief Complaint  Patient presents with   Follow-up   HPI: Patient had reached out in the interim reporting that she tried to use the Trazadone at night but it was causing headaches even on the 25 mg dose.  Discussed this and suggested trying an alternative however patient did not respond to the message.  On presentation today Rhonda [name] reports that she is doing good. She had a birthday a month ago which she enjoyed. Rhonda [name] has been feeling a bit more motivated lately and been engaging more outside the house. She has become more active in church in addition to doing things with others. Rhonda [name] attributes the improvement to her daughters motivation, starting light therapy every morning, and the improving weather. Patient also has a cruise to [place] coming up in May which she is excited for.   In regards to sleep she discontinued trazodone as we had discussed in the interim. Since then Rhonda [name] has started melatonin which she has found to be effective for sleep. She has only needed it around 1 night a week and will take it if it is 12 am and she is still awake. Rhonda [name] denies any side effect from the  gummies and reports that she is sleeping through the night without issue now.  She is taking her Prystiq consistently and denies any adverse side effects.   Past Psychiatric History: Patient reports that her psychiatric care started in 1991 following her father's suicide.  She was diagnosed with depression and seasonal affective disorder.  Patient was tried on a number of medications including Cymbalta,  Paxil, Zoloft, Lexapro, Wellbutrin XL 300 mg (worked well in the past with Pristiq), and Pristiq.  She denies trying any mood stabilizers or antipsychotics.  Patient notes that she is prescribed gabapentin for pain and it made her feel a bit loopy.  Patient attended therapy after separating for her husband and reports it was helpful at the time.  She denies any SI/HI or thoughts of self-harm or any prior suicide attempts as well as any past psychiatric hospitalizations.  Patient denies any substance use.  Past Medical History:  Past Medical History:  Diagnosis Date   Arthritis    knees   Carpal tunnel syndrome of left wrist 07/2016   Depression    History of MRSA infection > 10 years   right leg   Hyperlipidemia    Hypertension    states under control with meds., has been on med. > 2 yr.   OSA (obstructive sleep apnea) 09/05/2019    Past Surgical History:  Procedure Laterality Date   ABDOMINAL HYSTERECTOMY     partial   BREAST EXCISIONAL BIOPSY     CARPAL TUNNEL RELEASE Left 08/26/2016   Procedure: LEFT WRIST CARPAL TUNNEL RELEASE;  Surgeon: Loreta Ave, MD;  Location: [facility];  Service: Orthopedics;  Laterality: Left;   COLONOSCOPY WITH PROPOFOL  03/20/2014   LAPAROSCOPIC UNILATERAL SALPINGECTOMY Right 05/18/2002   OVARY BIOPSY Right 05/18/2002   TRIGGER FINGER RELEASE Left 02/10/2017   Procedure: LEFT TRIGGER THUMB RELEASE (TENDON SHEATH INCISION);  Surgeon: Loreta Ave, MD;  Location: [facility];  Service: Orthopedics;  Laterality: Left;    Family Psychiatric History: Patient reports that her father had depression and committed suicide in 75.  Family History:  Family History  Problem Relation Age of Onset   Breast cancer Maternal Aunt     Social History:  Social History   Socioeconomic History   Marital status: Single    Spouse name: Not on file   Number of children: Not on file   Years of education: Not on file   Highest  education level: Associate degree: academic program  Occupational History   Not on file  Tobacco Use   Smoking status: Never   Smokeless tobacco: Never  Substance and Sexual Activity   Alcohol use: No   Drug use: No   Sexual activity: Not on file  Other Topics Concern   Not on file  Social History Narrative   Left handed   Caffeine- 2 daily   Social Drivers of Health   Financial Resource Strain: Patient Declined (07/29/2023)   Overall Financial Resource Strain (CARDIA)    Difficulty of Paying Living Expenses: Patient declined  Food Insecurity: Patient Declined (07/29/2023)   Hunger Vital Sign    Worried About Running Out of Food in the Last Year: Patient declined    Ran Out of Food in the Last Year: Patient declined  Transportation Needs: Patient Declined (07/29/2023)   PRAPARE - Administrator, Civil Service (Medical): Patient declined    Lack of Transportation (Non-Medical): Patient declined  Physical Activity: Unknown (07/29/2023)   Exercise  Vital Sign    Days of Exercise per Week: Patient declined    Minutes of Exercise per Session: 30 min  Stress: Patient Declined (07/29/2023)   [employer] of Occupational Health - Occupational Stress Questionnaire    Feeling of Stress : Patient declined  Social Connections: Unknown (07/29/2023)   Social Connection and Isolation Panel [NHANES]    Frequency of Communication with Friends and Family: Patient declined    Frequency of Social Gatherings with Friends and Family: Patient declined    Attends Religious Services: Patient declined    Database administrator or Organizations: Patient declined    Attends Engineer, structural: More than 4 times per year    Marital Status: Patient declined    Allergies:  Allergies  Allergen Reactions   Penicillins Hives         Statins Other (See Comments)    MYALGIAS   Lisinopril Cough    Current Medications: Current Outpatient Medications  Medication Sig Dispense  Refill   azithromycin (ZITHROMAX) 250 MG tablet 2 tabs poqday1, 1 tab poqday 2-5 (Patient not taking: Reported on 08/01/2023) 6 tablet 0   Cholecalciferol (DIALYVITE VITAMIN D 5000) 125 MCG (5000 UT) capsule Take 2 capsules (10,000 Units total) by mouth daily.     desvenlafaxine (PRISTIQ) 25 MG 24 hr tablet Take 1 tablet (25 mg total) by mouth daily. Take with a 50 mg tab for a total of 75 mg daily 90 tablet 0   desvenlafaxine (PRISTIQ) 50 MG 24 hr tablet Take 1 tablet (50 mg total) by mouth daily. Take with a 25 mg tab for a total of 75 mg daily 90 tablet 0   doxepin (SINEQUAN) 10 MG capsule Take 1 capsule (10 mg total) by mouth at bedtime. 30 capsule 1   ezetimibe (ZETIA) 10 MG tablet Take 1 tablet (10 mg total) by mouth daily. 90 tablet 0   fluticasone (FLONASE) 50 MCG/ACT nasal spray Place into both nostrils as needed.      mometasone (ELOCON) 0.1 % cream Apply topically daily. 15 g 1   montelukast (SINGULAIR) 10 MG tablet TAKE 1 TABLET BY MOUTH EVERYDAY AT BEDTIME 90 tablet 3   Multiple Vitamins-Iron (DAILY VITAMIN/IRON) TABS Chelate Iron     omeprazole (PRILOSEC) 40 MG capsule Take 40 mg by mouth daily.     rizatriptan (MAXALT-MLT) 5 MG disintegrating tablet Take 1 tablet (5 mg total) by mouth as needed. May repeat in 2 hours if needed 10 tablet 6   topiramate (TOPAMAX) 25 MG tablet Take 2 tablets (50 mg total) by mouth at bedtime. 60 tablet 11   triamcinolone cream (KENALOG) 0.1 % Apply 1 Application topically 2 (two) times daily. 30 g 0   valsartan (DIOVAN) 160 MG tablet Take 1 tablet (160 mg total) by mouth daily. 30 tablet 5   No current facility-administered medications for this visit.     Psychiatric Specialty Exam: Review of Systems  There were no vitals taken for this visit.There is no height or weight on file to calculate BMI.  General Appearance: Fairly Groomed  Eye Contact:  Good  Speech:  Clear and Coherent and Normal Rate  Volume:  Normal  Mood:  Depressed  Affect:   Congruent  Thought Process:  Coherent, Goal Directed, and Linear  Orientation:  Full (Time, Place, and Person)  Thought Content: Logical   Suicidal Thoughts:  No  Homicidal Thoughts:  No  Memory:  NA  Judgement:  Good  Insight:  Good  Psychomotor  Activity:  Normal  Concentration:  Concentration: Good  Recall:  Good  Fund of Knowledge: Good  Language: Good  Akathisia:  NA    AIMS (if indicated): not done  Assets:  Communication Skills Desire for Improvement Housing Leisure Time Physical Health Resilience Social Support Talents/Skills Transportation Vocational/Educational  ADL's:  Intact  Cognition: WNL  Sleep:  Fair   Metabolic Disorder Labs: Lab Results  Component Value Date   HGBA1C 5.9 (H) 04/08/2022   MPG 123 04/08/2022   MPG 114 08/06/2019   No results found for: "PROLACTIN" Lab Results  Component Value Date   CHOL 212 (H) 05/07/2022   TRIG 70 05/07/2022   HDL 67 05/07/2022   CHOLHDL 3.2 05/07/2022   VLDL 17 08/02/2016   LDLCALC 129 (H) 05/07/2022   LDLCALC 142 (H) 10/30/2021   Lab Results  Component Value Date   TSH 0.45 09/17/2022   TSH 0.82 10/30/2021    Therapeutic Level Labs: No results found for: "LITHIUM" No results found for: "VALPROATE" No results found for: "CBMZ"   Screenings: GAD-7    Flowsheet Row Office Visit from 03/08/2023 in [facility] Office Visit from 05/25/2022 in [facility] Office Visit from 05/07/2022 in [facility] Office Visit from 10/29/2019 in [facility] Office Visit from 08/06/2019 in [facility]  Total GAD-7 Score 6 0 0 9 21      PHQ2-9    Flowsheet Row Office Visit from 05/30/2023 in [facility] Office Visit from 05/05/2023 in [facility] Office Visit from 03/08/2023 in [facility] Office Visit from 05/25/2022 in  [facility] Office Visit from 05/07/2022 in [facility]  PHQ-2 Total Score 0 0 0 0 0  PHQ-9 Total Score -- -- -- 0 1       Collaboration of Care: Collaboration of Care: Medication Management AEB medication prescription and Other provider involved in patient's care AEB PCP chart review  Patient/Guardian was advised Release of Information must be obtained prior to any record release in order to collaborate their care with an outside provider. Patient/Guardian was advised if they have not already done so to contact the registration department to sign all necessary forms in order for [place] to release information regarding their care.   Consent: Patient/Guardian gives verbal consent for treatment and assignment of benefits for services provided during this visit. Patient/Guardian expressed understanding and agreed to proceed.    Stasia Cavalier, MD 09/08/2023, 11:29 AM   Virtual Visit via Video Note  I connected with Rhonda [name] on 09/08/23 at 11:00 AM EDT by a video enabled telemedicine application and verified that I am speaking with the correct person using two identifiers.  Location: Patient: Home Provider: Home Office   I discussed the limitations of evaluation and management by telemedicine and the availability of in person appointments. The patient expressed understanding and agreed to proceed.   I discussed the assessment and treatment plan with the patient. The patient was provided an opportunity to ask questions and all were answered. The patient agreed with the plan and demonstrated an understanding of the instructions.   The patient was advised to call back or seek an in-person evaluation if the symptoms worsen or if the condition fails to improve as anticipated.  I provided 10 minutes of non-face-to-face time during this encounter.  Stasia Cavalier, MD

## 2023-09-08 ENCOUNTER — Telehealth (HOSPITAL_COMMUNITY): Payer: Medicaid Other | Admitting: Psychiatry

## 2023-09-08 ENCOUNTER — Encounter (HOSPITAL_COMMUNITY): Payer: Self-pay | Admitting: Psychiatry

## 2023-09-08 DIAGNOSIS — F321 Major depressive disorder, single episode, moderate: Secondary | ICD-10-CM

## 2023-09-08 DIAGNOSIS — F411 Generalized anxiety disorder: Secondary | ICD-10-CM | POA: Diagnosis not present

## 2023-09-08 MED ORDER — DESVENLAFAXINE SUCCINATE ER 50 MG PO TB24
50.0000 mg | ORAL_TABLET | Freq: Every day | ORAL | 0 refills | Status: DC
Start: 2023-09-08 — End: 2023-12-20

## 2023-09-08 MED ORDER — DESVENLAFAXINE SUCCINATE ER 25 MG PO TB24
25.0000 mg | ORAL_TABLET | Freq: Every day | ORAL | 0 refills | Status: DC
Start: 2023-09-08 — End: 2023-12-20

## 2023-09-19 ENCOUNTER — Telehealth: Payer: Self-pay | Admitting: Family Medicine

## 2023-09-19 ENCOUNTER — Other Ambulatory Visit: Payer: Self-pay

## 2023-09-19 MED ORDER — EZETIMIBE 10 MG PO TABS: 10.0000 mg | ORAL_TABLET | Freq: Every day | ORAL | 0 refills | Status: DC

## 2023-09-19 NOTE — Telephone Encounter (Signed)
 Prescription Request  09/19/2023  LOV: 08/01/2023  What is the name of the medication or equipment?   ezetimibe (ZETIA) 10 MG tablet   Have you contacted your pharmacy to request a refill? Yes   Which pharmacy would you like this sent to?  [id number] - [place], [place] - [address] AT [facility] [address] [place] [address] Phone: [phone] Fax: [phone]    Patient notified that their request is being sent to the clinical staff for review and that they should receive a response within 2 business days.   Please advise pharmacist.

## 2023-09-20 DIAGNOSIS — M25462 Effusion, left knee: Secondary | ICD-10-CM | POA: Diagnosis not present

## 2023-09-25 ENCOUNTER — Encounter: Payer: Self-pay | Admitting: Family Medicine

## 2023-09-25 DIAGNOSIS — M1712 Unilateral primary osteoarthritis, left knee: Secondary | ICD-10-CM | POA: Diagnosis not present

## 2023-09-25 DIAGNOSIS — R0781 Pleurodynia: Secondary | ICD-10-CM | POA: Diagnosis not present

## 2023-09-29 NOTE — Patient Instructions (Signed)
 Below is our plan:  We will repeat home sleep study to evaluate for sleep apnea. May restart therapy if needed. Continue topriamate 50mg  at bedtime. Continue rizatriptan as needed and let me know if you have any trouble getting it filled.   I advise against use of estrogen containing products with history of migraine with aura as this can increase your risk of stoke.   While your insurance requires that you use CPAP at least 4 hours each night on 70% of the nights, I recommend, that you not skip any nights and use it throughout the night if you can. Getting used to CPAP and staying with the treatment long term does take time and patience and discipline. Untreated obstructive sleep apnea when it is moderate to severe can have an adverse impact on cardiovascular health and raise her risk for heart disease, arrhythmias, hypertension, congestive heart failure, stroke and diabetes. Untreated obstructive sleep apnea causes sleep disruption, nonrestorative sleep, and sleep deprivation. This can have an impact on your day to day functioning and cause daytime sleepiness and impairment of cognitive function, memory loss, mood disturbance, and problems focussing. Using CPAP regularly can improve these symptoms.  We will update supply orders, today.   Please make sure you are staying well hydrated. I recommend 50-60 ounces daily. Well balanced diet and regular exercise encouraged. Consistent sleep schedule with 6-8 hours recommended.   GENERAL HEADACHE INFORMATION:   Natural supplements: Magnesium Oxide or Magnesium Glycinate 500 mg at bed (up to 800 mg daily) Coenzyme Q10 300 mg in AM Vitamin B2- 200 mg twice a day   Add 1 supplement at a time since even natural supplements can have undesirable side effects. You can sometimes buy supplements cheaper (especially Coenzyme Q10) at www.WebmailGuide.co.za or at [facility].  Migraine with aura: There is increased risk for stroke in women with migraine with aura and a  contraindication for the combined contraceptive pill for use by women who have migraine with aura. The risk for women with migraine without aura is lower. However other risk factors like smoking are far more likely to increase stroke risk than migraine. There is a recommendation for no smoking and for the use of OCPs without estrogen such as progestogen only pills particularly for women with migraine with aura.Marland Kitchen People who have migraine headaches with auras may be 3 times more likely to have a stroke caused by a blood clot, compared to migraine patients who don't see auras. Women who take hormone-replacement therapy may be 30 percent more likely to suffer a clot-based stroke than women not taking medication containing estrogen. Other risk factors like smoking and high blood pressure may be  much more important.    Vitamins and herbs that show potential:   Magnesium: Magnesium (250 mg twice a day or 500 mg at bed) has a relaxant effect on smooth muscles such as blood vessels. Individuals suffering from frequent or daily headache usually have low magnesium levels which can be increase with daily supplementation of 400-750 mg. Three trials found 40-90% average headache reduction  when used as a preventative. Magnesium may help with headaches are aura, the best evidence for magnesium is for migraine with aura is its thought to stop the cortical spreading depression we believe is the pathophysiology of migraine aura.Magnesium also demonstrated the benefit in menstrually related migraine.  Magnesium is part of the messenger system in the serotonin cascade and it is a good muscle relaxant.  It is also useful for constipation which can be a  side effect of other medications used to treat migraine. Good sources include nuts, whole grains, and tomatoes. Side Effects: loose stool/diarrhea  Riboflavin (vitamin B 2) 200 mg twice a day. This vitamin assists nerve cells in the production of ATP a principal energy storing  molecule.  It is necessary for many chemical reactions in the body.  There have been at least 3 clinical trials of riboflavin using 400 mg per day all of which suggested that migraine frequency can be decreased.  All 3 trials showed significant improvement in over half of migraine sufferers.  The supplement is found in bread, cereal, milk, meat, and poultry.  Most Americans get more riboflavin than the recommended daily allowance, however riboflavin deficiency is not necessary for the supplements to help prevent headache. Side effects: energizing, green urine   Coenzyme Q10: This is present in almost all cells in the body and is critical component for the conversion of energy.  Recent studies have shown that a nutritional supplement of CoQ10 can reduce the frequency of migraine attacks by improving the energy production of cells as with riboflavin.  Doses of 150 mg twice a day have been shown to be effective.   Melatonin: Increasing evidence shows correlation between melatonin secretion and headache conditions.  Melatonin supplementation has decreased headache intensity and duration.  It is widely used as a sleep aid.  Sleep is natures way of dealing with migraine.  A dose of 3 mg is recommended to start for headaches including cluster headache. Higher doses up to 15 mg has been reviewed for use in Cluster headache and have been used. The rationale behind using melatonin for cluster is that many theories regarding the cause of Cluster headache center around the disruption of the normal circadian rhythm in the brain.  This helps restore the normal circadian rhythm.   HEADACHE DIET: Foods and beverages which may trigger migraine Note that only 20% of headache patients are food sensitive. You will know if you are food sensitive if you get a headache consistently 20 minutes to 2 hours after eating a certain food. Only cut out a food if it causes headaches, otherwise you might remove foods you enjoy! What matters  most for diet is to eat a well balanced healthy diet full of vegetables and low fat protein, and to not miss meals.   Chocolate, other sweets ALL cheeses except cottage and cream cheese Dairy products, yogurt, sour cream, ice cream Liver Meat extracts (Bovril, Marmite, meat tenderizers) Meats or fish which have undergone aging, fermenting, pickling or smoking. These include: Hotdogs,salami,Lox,sausage, mortadellas,smoked salmon, pepperoni, Pickled herring Pods of broad bean (English beans, Chinese pea pods, [place] (fava) beans, lima and navy beans Ripe avocado, ripe banana Yeast extracts or active yeast preparations such as Brewer's or Fleishman's (commercial bakes goods are permitted) Tomato based foods, pizza (lasagna, etc.)   MSG (monosodium glutamate) is disguised as many things; look for these common aliases: Monopotassium glutamate Autolysed yeast Hydrolysed protein Sodium caseinate "flavorings" "all natural preservatives" Nutrasweet   Avoid all other foods that convincingly provoke headaches.   Resources: The Dizzy [place] Laundry Your Headache Diet, migrainestrong.com  https://zamora-andrews.com/   Caffeine and Migraine For patients that have migraine, caffeine intake more than 3 days per week can lead to dependency and increased migraine frequency. I would recommend cutting back on your caffeine intake as best you can. The recommended amount of caffeine is 200-300 mg daily, although migraine patients may experience dependency at even lower doses. While you may notice  an increase in headache temporarily, cutting back will be helpful for headaches in the long run. For more information on caffeine and migraine, visit: https://americanmigrainefoundation.org/resource-library/caffeine-and-migraine/   Headache Prevention Strategies:   1. Maintain a headache diary; learn to identify and avoid triggers.  - This can be a simple note where you  log when you had a headache, associated symptoms, and medications used - There are several smartphone apps developed to help track migraines: Migraine Buddy, Migraine Monitor, Curelator N1-Headache App   Common triggers include: Emotional triggers: Emotional/Upset family or friends Emotional/Upset occupation Business reversal/success Anticipation anxiety Crisis-serious Post-crisis periodNew job/position   Physical triggers: Vacation Day Weekend Strenuous Exercise High Altitude Location New Move Menstrual Day Physical Illness Oversleep/Not enough sleep Weather changes Light: Photophobia or light sesnitivity treatment involves a balance between desensitization and reduction in overly strong input. Use dark polarized glasses outside, but not inside. Avoid bright or fluorescent light, but do not dim environment to the point that going into a normally lit room hurts. Consider FL-41 tint lenses, which reduce the most irritating wavelengths without blocking too much light.  These can be obtained at axonoptics.com or theraspecs.com Foods: see list above.   2. Limit use of acute treatments (over-the-counter medications, triptans, etc.) to no more than 2 days per week or 10 days per month to prevent medication overuse headache (rebound headache).     3. Follow a regular schedule (including weekends and holidays): Don't skip meals. Eat a balanced diet. 8 hours of sleep nightly. Minimize stress. Exercise 30 minutes per day. Being overweight is associated with a 5 times increased risk of chronic migraine. Keep well hydrated and drink 6-8 glasses of water per day.   4. Initiate non-pharmacologic measures at the earliest onset of your headache. Rest and quiet environment. Relax and reduce stress. Breathe2Relax is a free app that can instruct you on    some simple relaxtion and breathing techniques. Http://Dawnbuse.com is a    free website that provides teaching videos on relaxation.  Also, there  are  many apps that   can be downloaded for "mindful" relaxation.  An app called YOGA NIDRA will help walk you through mindfulness. Another app called Calm can be downloaded to give you a structured mindfulness guide with daily reminders and skill development. Headspace for guided meditation Mindfulness Based Stress Reduction Online Course: www.palousemindfulness.com Cold compresses.   5. Don't wait!! Take the maximum allowable dosage of prescribed medication at the first sign of migraine.   6. Compliance:  Take prescribed medication regularly as directed and at the first sign of a migraine.   7. Communicate:  Call your physician when problems arise, especially if your headaches change, increase in frequency/severity, or become associated with neurological symptoms (weakness, numbness, slurred speech, etc.). Proceed to emergency room if you experience new or worsening symptoms or symptoms do not resolve, if you have new neurologic symptoms or if headache is severe, or for any concerning symptom.   8. Headache/pain management therapies: Consider various complementary methods, including medication, behavioral therapy, psychological counselling, biofeedback, massage therapy, acupuncture, dry needling, and other modalities.  Such measures may reduce the need for medications. Counseling for pain management, where patients learn to function and ignore/minimize their pain, seems to work very well.   9. Recommend changing family's attention and focus away from patient's headaches. Instead, emphasize daily activities. If first question of day is 'How are your headaches/Do you have a headache today?', then patient will constantly think about headaches, thus making them worse.  Goal is to re-direct attention away from headaches, toward daily activities and other distractions.   10. Helpful Websites: www.AmericanHeadacheSociety.org PatentHood.ch www.headaches.org TightMarket.nl www.achenet.org     Please continue follow up with care team as directed.   Follow up with me in 1 year   You may receive a survey regarding today's visit. I encourage you to leave honest feed back as I do use this information to improve patient care. Thank you for seeing me today!

## 2023-09-29 NOTE — Progress Notes (Signed)
 PATIENT: Rhonda [name] DOB: Nov 07, 1960  REASON FOR VISIT: follow up HISTORY FROM: patient  Chief Complaint  Patient presents with   Follow-up    Pt in room 1.alone. Here for migraine follow up. Pt no longer uses cpap machine, pt has lost weight, was told to follow up at next visit to discuss. Pt reports migraines are better, no migraines in while. Last cpap data online was in 01/2023.     HISTORY OF PRESENT ILLNESS:  10/03/23 ALL:  Rhonda [name] returns for follow up for OSA previously on CPAP and migraines with aura. She was last seen by Dr Terrace Arabia 03/2023 and started on topiramate 50mg  at bedtime and rizatriptan as needed. Since, she reports migraines have improved. She may have 1-2 per month. Occasional vision change (vision loss, spots or lines in vision) prior to headaches. She did not receive rizatriptan and unclear why. She usually takes Tylenol Arthritis. She stopped using CPAP early 04/2023. She has lost nearly 70lbs since original sleep study 03/2019. Bariatric  surgery on 06/10/2023. She would consider restarting therapy if needed.   04/26/2022 ALL: Rhonda [name] returns for follow up for OSA on CPAP. She was last seen 09/2021 and having difficulty with compliance. Since, she reports doing much better. She is using CPAP nightly for about 5 hours. She is now using a nasal pillow and feels it is much more comfortable than FFM. She denies concerns with machine or supplies. She is planning to have gastric sleeve with Dr Clent Ridges.     10/15/2021 ALL: Rhonda [name] returns for follow up for OSA on CPAP. She was last seen 09/2020 and doing well. She reports being diagnosed with Covid in 03/2021. Since, she has had difficulty using CPAP. She feels that she is smothering. She felt that the pressure was too strong before she could go to sleep. She knows that she needs CPAP. She is much more tired than normal. She understands need for therapy and wishes to resume.     10/15/2020 ALL:  She returns for follow up for  OSA on CPAP. She continues to do very well on therapy. She is sleeping more soundly and feels better rested. She is not having to take naps during the day. She is caring for her child with special needs and recently started caring for an adult friend with special needs. She admits that sometimes she falls asleep in her chair reading and forgets to put CPAP mask on. Otherwise, doing very well.      10/15/2019 ALL:  Rhonda [name] is a 63 y.o. female here today for follow up for OSA on CPAP. She is doing well. She has changed her mask and now using nasal pillow. She feels this is a much better fit for her. She feels much better on CPAP therapy.   Compliance report dated 09/11/2019 through 10/10/2019 reveals that she used CPAP 30 of the past 30 days for compliance of 100%.  She used CPAP greater than 4 hours 27 of the past 30 days for compliance of 90%.  Average usage was 5 hours minutes.  Residual AHI was 4.9 on 6 to 12 cm of water and an EPR of 3.  Leak has significantly improved and 95th percentile now 12.9.  HISTORY: (copied from  note on 07/16/2019)  Rhonda [name] is a 63 y.o. female here today for follow up. HST revealed severe OSA with AHI 52.3 and O2 nadir of 88%. She returns today for her initial CPAP compliance review.  She has  noted significant improvement in sleep quality since starting CPAP therapy.  She is using CPAP nightly.  Her only concern is that she does note a leak around the nasal area of her mask.  She is using a DreamWear full facemask.  She is concerned that it may be the wrong size.  Otherwise she is doing very well   Compliance report dated 06/12/2019 through 07/11/2019 reveals that she used CPAP 30 out of the last 30 days for compliance of 100%.  She is CPAP greater than 4 hours all 30 days for compliance of 100%.  Average usage was 6 hours and 17 minutes.  Residual AHI was 6.2 on 6 to 12 cm of water and an EPR of 3.  Pressure in the 95th percentile of 11.8 with maximum 11.9.   A leak was noted in the 95th percentile of 22.6.    HISTORY: (copied from Dr Teofilo Pod note on 04/05/2019)   Dear Dr. Jeanice Lim, I saw your patient, Rhonda [name], upon your kind request to my sleep clinic today for initial consultation of her sleep disorder, in particular, concern for underlying obstructive sleep apnea.  The patient is unaccompanied today.  As you know, his Deady is a 63 year old right-handed woman with an underlying medical history of arthritis, history of carpal tunnel syndrome with status post surgery on the left, depression, hypertension, and obesity, who reports snoring and excessive daytime somnolence, nonrestorative sleep, morning headaches and witnessed apneas.  The patient reports that she has been told by her daughter that she stops breathing and that snoring has been loud as reported by her daughter and also by a cousin.  She does not wake up rested.  She has lack of energy.  She has an Epworth sleepiness score of 8 out of 24, fatigue severity score is 49 out of 63.  She tries to nap on the weekend when she can catch up on sleep and may sleep for 3 to 4 hours during what started as a nap.  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  She has an uncle with sleep apnea.  She has had trouble losing weight despite trying.  She lives with her 58 year old mother who stays with her during the week, particularly to help out with patient's 15 year old oldest son who has special needs.  She has a set of twins, age 18, a son and a daughter, she has 7 70-year-old grandson.  She has no night to night nocturia.  She has woken up with the occasional headache, different from her migraines in the past.  Her morning headaches are more dull and achy and pressure-like.  She drinks caffeine in the form of coffee, typically 2 cups/day.  She has had some food allergies but has not been formally tested.  Sometimes when she eats tomatoes for  example her tongue swells and she takes Benadryl.  She is reminded to get allergy testing done for this.  Her bedtime is generally between 930 and 1030.  She falls asleep quickly but does not stay asleep well.  She has multiple nighttime awakenings.  Her rise time is around 530.  She has to be at work around 830.   REVIEW OF SYSTEMS: Out of a complete 14 system review of symptoms, the patient complains only of the following symptoms, fatigue, headaches, and all other reviewed systems are negative.  ESS: 9/24, previously 8/24  ALLERGIES: Allergies  Allergen Reactions   Penicillins Hives  Statins Other (See Comments)    MYALGIAS   Lisinopril Cough    HOME MEDICATIONS: Outpatient Medications Prior to Visit  Medication Sig Dispense Refill   acetaminophen (TYLENOL) 500 MG tablet Take 500 mg by mouth every 6 (six) hours as needed.     azelastine (ASTELIN) 0.1 % nasal spray Place 1 spray into both nostrils 2 (two) times daily.     benzonatate (TESSALON) 200 MG capsule Take 200 mg by mouth 2 (two) times daily as needed. Take one capsule (200 mg dose) by mouth 3 (three) times a day as needed for Cough for up to 7 days.     Cholecalciferol (DIALYVITE VITAMIN D 5000) 125 MCG (5000 UT) capsule Take 2 capsules (10,000 Units total) by mouth daily.     desvenlafaxine (PRISTIQ) 25 MG 24 hr tablet Take 1 tablet (25 mg total) by mouth daily. Take with a 50 mg tab for a total of 75 mg daily 90 tablet 0   desvenlafaxine (PRISTIQ) 50 MG 24 hr tablet Take 1 tablet (50 mg total) by mouth daily. Take with a 25 mg tab for a total of 75 mg daily 90 tablet 0   doxepin (SINEQUAN) 10 MG capsule Take 1 capsule (10 mg total) by mouth at bedtime. 30 capsule 1   ezetimibe (ZETIA) 10 MG tablet Take 1 tablet (10 mg total) by mouth daily. 90 tablet 0   fluticasone (FLONASE) 50 MCG/ACT nasal spray Place into both nostrils as needed.      methocarbamol (ROBAXIN) 500 MG tablet Take 500 mg by mouth 4 (four) times  daily. For 10 days     mometasone (ELOCON) 0.1 % cream Apply topically daily. 15 g 1   montelukast (SINGULAIR) 10 MG tablet TAKE 1 TABLET BY MOUTH EVERYDAY AT BEDTIME 90 tablet 3   Multiple Vitamins-Iron (DAILY VITAMIN/IRON) TABS Chelate Iron     omeprazole (PRILOSEC) 40 MG capsule Take 40 mg by mouth daily.     triamcinolone cream (KENALOG) 0.1 % Apply 1 Application topically 2 (two) times daily. 30 g 0   valsartan (DIOVAN) 160 MG tablet Take 1 tablet (160 mg total) by mouth daily. 30 tablet 5   VENTOLIN HFA 108 (90 Base) MCG/ACT inhaler Inhale 2 puffs into the lungs every 4 (four) hours as needed for wheezing.     rizatriptan (MAXALT-MLT) 5 MG disintegrating tablet Take 1 tablet (5 mg total) by mouth as needed. May repeat in 2 hours if needed 10 tablet 6   topiramate (TOPAMAX) 25 MG tablet Take 2 tablets (50 mg total) by mouth at bedtime. 60 tablet 11   azithromycin (ZITHROMAX) 250 MG tablet 2 tabs poqday1, 1 tab poqday 2-5 (Patient not taking: Reported on 10/03/2023) 6 tablet 0   No facility-administered medications prior to visit.    PAST MEDICAL HISTORY: Past Medical History:  Diagnosis Date   Arthritis    knees   Carpal tunnel syndrome of left wrist 07/2016   Depression    History of MRSA infection > 10 years   right leg   Hyperlipidemia    Hypertension    states under control with meds., has been on med. > 2 yr.   OSA (obstructive sleep apnea) 09/05/2019    PAST SURGICAL HISTORY: Past Surgical History:  Procedure Laterality Date   ABDOMINAL HYSTERECTOMY     partial   BREAST EXCISIONAL BIOPSY     CARPAL TUNNEL RELEASE Left 08/26/2016   Procedure: LEFT WRIST CARPAL TUNNEL RELEASE;  Surgeon: Loreta Ave, MD;  Location: [facility];  Service: Orthopedics;  Laterality: Left;   COLONOSCOPY WITH PROPOFOL  03/20/2014   LAPAROSCOPIC UNILATERAL SALPINGECTOMY Right 05/18/2002   OVARY BIOPSY Right 05/18/2002   TRIGGER FINGER RELEASE Left 02/10/2017   Procedure: LEFT  TRIGGER THUMB RELEASE (TENDON SHEATH INCISION);  Surgeon: Loreta Ave, MD;  Location: [facility];  Service: Orthopedics;  Laterality: Left;    FAMILY HISTORY: Family History  Problem Relation Age of Onset   Breast cancer Maternal Aunt     SOCIAL HISTORY: Social History   Socioeconomic History   Marital status: Single    Spouse name: Not on file   Number of children: Not on file   Years of education: Not on file   Highest education level: Associate degree: academic program  Occupational History   Not on file  Tobacco Use   Smoking status: Never   Smokeless tobacco: Never  Vaping Use   Vaping status: Never Used  Substance and Sexual Activity   Alcohol use: No   Drug use: No   Sexual activity: Not on file  Other Topics Concern   Not on file  Social History Narrative   Left handed   Caffeine- 2 daily   Social Drivers of Health   Financial Resource Strain: Patient Declined (07/29/2023)   Overall Financial Resource Strain (CARDIA)    Difficulty of Paying Living Expenses: Patient declined  Food Insecurity: Patient Declined (07/29/2023)   Hunger Vital Sign    Worried About Running Out of Food in the Last Year: Patient declined    Ran Out of Food in the Last Year: Patient declined  Transportation Needs: Patient Declined (07/29/2023)   PRAPARE - Administrator, Civil Service (Medical): Patient declined    Lack of Transportation (Non-Medical): Patient declined  Physical Activity: Unknown (07/29/2023)   Exercise Vital Sign    Days of Exercise per Week: Patient declined    Minutes of Exercise per Session: Not on file  Stress: Patient Declined (07/29/2023)   [employer] of Occupational Health - Occupational Stress Questionnaire    Feeling of Stress : Patient declined  Social Connections: Unknown (07/29/2023)   Social Connection and Isolation Panel [NHANES]    Frequency of Communication with Friends and Family: Patient declined    Frequency of  Social Gatherings with Friends and Family: Patient declined    Attends Religious Services: Patient declined    Database administrator or Organizations: Patient declined    Attends Banker Meetings: Not on file    Marital Status: Patient declined  Intimate Partner Violence: Not At Risk (02/24/2023)   Received from [facility]   HITS    Over the last 12 months how often did your partner physically hurt you?: Never    Over the last 12 months how often did your partner insult you or talk down to you?: Never    Over the last 12 months how often did your partner threaten you with physical harm?: Never    Over the last 12 months how often did your partner scream or curse at you?: Never      PHYSICAL EXAM  Vitals:   10/03/23 1039  BP: 126/72  Pulse: 80  Weight: 136 lb 8 oz (61.9 kg)  Height: 5\' 4"  (1.626 m)      Body mass index is 23.43 kg/m.  Generalized: Well developed, in no acute distress  Cardiology: normal rate and rhythm, no murmur noted Respiratory: clear to auscultation bilaterally  Neurological examination  Mentation: Alert oriented to time, place, history taking. Follows all commands speech and language fluent Cranial nerve II-XII: Pupils were equal round reactive to light. Extraocular movements were full, visual field were full  Motor: The motor testing reveals 5 over 5 strength of all 4 extremities. Good symmetric motor tone is noted throughout.  Gait and station: Gait is normal.   DIAGNOSTIC DATA (LABS, IMAGING, TESTING) - I reviewed patient records, labs, notes, testing and imaging myself where available.      No data to display           Lab Results  Component Value Date   WBC 4.5 05/05/2023   HGB 13.1 05/05/2023   HCT 38.4 05/05/2023   MCV 94.3 05/05/2023   PLT 223 05/05/2023      Component Value Date/Time   NA 142 05/05/2023 1613   K 3.9 05/05/2023 1613   CL 106 05/05/2023 1613   CO2 28 05/05/2023 1613   GLUCOSE 88 05/05/2023  1613   BUN 12 05/05/2023 1613   CREATININE 0.70 05/05/2023 1613   CALCIUM 8.8 05/05/2023 1613   PROT 6.1 05/05/2023 1613   ALBUMIN 4.2 08/02/2016 1109   AST 16 05/05/2023 1613   ALT 11 05/05/2023 1613   ALKPHOS 59 08/02/2016 1109   BILITOT 0.3 05/05/2023 1613   GFRNONAA 61 04/07/2020 1006   GFRAA 71 04/07/2020 1006   Lab Results  Component Value Date   CHOL 212 (H) 05/07/2022   HDL 67 05/07/2022   LDLCALC 129 (H) 05/07/2022   TRIG 70 05/07/2022   CHOLHDL 3.2 05/07/2022   Lab Results  Component Value Date   HGBA1C 5.9 (H) 04/08/2022   Lab Results  Component Value Date   VITAMINB12 264 04/25/2007   Lab Results  Component Value Date   TSH 0.45 09/17/2022     ASSESSMENT AND PLAN 63 y.o. year old female  has a past medical history of Arthritis, Carpal tunnel syndrome of left wrist (07/2016), Depression, History of MRSA infection (> 10 years), Hyperlipidemia, Hypertension, and OSA (obstructive sleep apnea) (09/05/2019). here with     ICD-10-CM   1. OSA on CPAP  G47.33 Home sleep test    2. Chronic migraine with aura without status migrainosus, not intractable  G43.E09        Shaunta was doing well on CPAP therapy but stopped just prior to bariatic surgery 05/2023. We will repeat HST to evaluate need following weight loss. She is eligible for a new machine 05/2019. She will continue topiramate 50mg  at bedtime. We will resend rizatriptan as needed. Advised to avoid estrogen products. Healthy lifestyle habits encouraged.  She will follow-up with me in 1 year, sooner if needed.  She verbalizes understanding and agreement with this plan.   Orders Placed This Encounter  Procedures   Home sleep test    Reeval after weight loss, would be eligible for new machine late 2025.    Standing Status:   Future    Expiration Date:   10/02/2024    Where should this test be performed::   [facility]     Meds ordered this encounter  Medications   rizatriptan (MAXALT-MLT)  5 MG disintegrating tablet    Sig: Take 1 tablet (5 mg total) by mouth as needed. May repeat in 2 hours if needed    Dispense:  10 tablet    Refill:  6    Supervising Provider:   Anson Fret [[id number]]   topiramate (  TOPAMAX) 25 MG tablet    Sig: Take 2 tablets (50 mg total) by mouth at bedtime.    Dispense:  180 tablet    Refill:  3    Supervising Provider:   Anson Fret [id number]      I spent 30 minutes of face-to-face and non-face-to-face time with patient.  This included previsit chart review, lab review, study review, order entry, electronic health record documentation, patient education.   Shawnie Dapper, FNP-C 10/03/2023, 11:14 AM [facility] [address] [place], [place] [address] [phone]

## 2023-10-02 DIAGNOSIS — R051 Acute cough: Secondary | ICD-10-CM | POA: Diagnosis not present

## 2023-10-02 DIAGNOSIS — T148XXA Other injury of unspecified body region, initial encounter: Secondary | ICD-10-CM | POA: Diagnosis not present

## 2023-10-02 DIAGNOSIS — J302 Other seasonal allergic rhinitis: Secondary | ICD-10-CM | POA: Diagnosis not present

## 2023-10-02 DIAGNOSIS — R0602 Shortness of breath: Secondary | ICD-10-CM | POA: Diagnosis not present

## 2023-10-03 ENCOUNTER — Ambulatory Visit: Payer: Medicaid Other | Admitting: Family Medicine

## 2023-10-03 ENCOUNTER — Encounter: Payer: Self-pay | Admitting: Family Medicine

## 2023-10-03 VITALS — BP 126/72 | HR 80 | Ht 64.0 in | Wt 136.5 lb

## 2023-10-03 DIAGNOSIS — G43E09 Chronic migraine with aura, not intractable, without status migrainosus: Secondary | ICD-10-CM | POA: Diagnosis not present

## 2023-10-03 DIAGNOSIS — G4733 Obstructive sleep apnea (adult) (pediatric): Secondary | ICD-10-CM

## 2023-10-03 MED ORDER — TOPIRAMATE 25 MG PO TABS: 50.0000 mg | ORAL_TABLET | Freq: Every evening | ORAL | 3 refills | Status: AC

## 2023-10-03 MED ORDER — RIZATRIPTAN BENZOATE 5 MG PO TBDP: 5.0000 mg | ORAL_TABLET | ORAL | 6 refills | Status: AC | PRN

## 2023-10-05 ENCOUNTER — Telehealth: Payer: Self-pay | Admitting: Family Medicine

## 2023-10-05 NOTE — Telephone Encounter (Signed)
 HST [facility] pending

## 2023-10-11 NOTE — Telephone Encounter (Signed)
 HST [facility] no auth req via fax

## 2023-10-25 ENCOUNTER — Ambulatory Visit: Admitting: Neurology

## 2023-10-25 DIAGNOSIS — G4733 Obstructive sleep apnea (adult) (pediatric): Secondary | ICD-10-CM

## 2023-10-26 NOTE — Progress Notes (Signed)
 See procedure note.

## 2023-10-28 NOTE — Procedures (Signed)
 Rhonda [name]  HOME SLEEP TEST (Watch PAT) REPORT  STUDY DATE: 10/25/2023  DOB: 01-12-1961  MRN: [id number]  ORDERING CLINICIAN: Debbra Fairy, MD, PhD   REFERRING CLINICIAN: Terrilyn Fick, NP  CLINICAL INFORMATION/HISTORY: 63 year old female with an underlying medical history of arthritis, history of carpal tunnel syndrome with status post surgery on the left, depression, hypertension, and prior diagnosis of obstructive sleep apnea, status post weight loss surgery, who presents for reevaluation.  She has not been using her PAP machine in over 6 months.  BMI: 23.4 kg/m  FINDINGS:   Sleep Summary:   Total Recording Time (hours, min): 6 hours, 7 min  Total Sleep Time (hours, min):  4 hours, 26 min  Percent REM (%):    8.4%   Respiratory Indices:   Calculated pAHI (per hour):  16.2/hour         REM pAHI:    N/A       NREM pAHI: 16.2/hour  Central pAHI: 3.4/hour  Oxygen Saturation Statistics:    Oxygen Saturation (%) Mean: 96%   Minimum oxygen saturation (%):                 85%   O2 Saturation Range (%): 85-99%    O2 Saturation (minutes) <=88%: 0.1 min  Pulse Rate Statistics:   Pulse Mean (bpm):    62/min    Pulse Range (45 -91/min)   IMPRESSION: OSA (obstructive sleep apnea), moderate    RECOMMENDATION:  This home sleep test demonstrates moderate obstructive sleep apnea with a total AHI of 16.2/hour and O2 nadir of 85%.  Intermittent mild to moderate snoring was detected.  Ongoing treatment with a positive airway pressure (PAP) device is recommended. The patient will be advised to resume AutoPap therapy.  Overall AHI has indeed improved compared to her home sleep test from 04/18/2019 which showed an AHI of 53.2/h, O2 nadir 88%. A full night titration study may be considered to optimize treatment settings, monitor proper oxygen saturations and aid with improvement of tolerance and adherence, if needed down the road. Alternative treatment options  may include a dental device through dentistry or orthodontics in selected patients or Inspire (hypoglossal nerve stimulator) in carefully selected patients (meeting inclusion criteria).  Concomitant weight loss is recommended (where clinically appropriate). Please note that untreated obstructive sleep apnea may carry additional perioperative morbidity. Patients with significant obstructive sleep apnea should receive perioperative PAP therapy and the surgeons and particularly the anesthesiologist should be informed of the diagnosis and the severity of the sleep disordered breathing. The patient should be cautioned not to drive, work at heights, or operate dangerous or heavy equipment when tired or sleepy. Review and reiteration of good sleep hygiene measures should be pursued with any patient. Other causes of the patient's symptoms, including circadian rhythm disturbances, an underlying mood disorder, medication effect and/or an underlying medical problem cannot be ruled out based on this test. Clinical correlation is recommended.  The patient and her referring provider will be notified of the test results. The patient will be seen in follow up in sleep clinic at [facility].  I certify that I have reviewed the raw data recording prior to the issuance of this report in accordance with the standards of the American Academy of Sleep Medicine (AASM).  INTERPRETING PHYSICIAN:   Debbra Fairy, MD, PhD Medical Director, [name]) Diplomat, ABPN (Neurology and Sleep)   [name] [address] [place], [place] [address] [phone]

## 2023-10-31 ENCOUNTER — Encounter: Payer: Self-pay | Admitting: Family Medicine

## 2023-10-31 DIAGNOSIS — G4733 Obstructive sleep apnea (adult) (pediatric): Secondary | ICD-10-CM

## 2023-11-05 DIAGNOSIS — G4733 Obstructive sleep apnea (adult) (pediatric): Secondary | ICD-10-CM | POA: Diagnosis not present

## 2023-11-07 NOTE — Progress Notes (Unsigned)
 BH MD/PA/NP OP Progress Note  11/07/2023 10:08 AM Rhonda [name]  MRN:  [id number]  Visit Diagnosis:  No diagnosis found.   Assessment: Rhonda [name] is a 63 y.o. female with a history of depression, obstructive sleep apnea who presented to [facility] for initial evaluation on 04/15/22.    During initial evaluation patient reported symptoms of anxiety, increased irritability, increased fatigue, decreased concentration, low mood, and increased need to sleep.  Patient reported that her symptoms tend to be worse in the fall and winter.  She denied any suicidal ideation or thoughts of self-harm along with any history of psychosis, mania, paranoia, or delusions.  Of note patient has sleep apnea which is well-controlled with CPAP, and vitamin D  deficiency which is well-controlled.  Patient's symptoms meet criteria for MDD at time of initial evaluation.  Following patient's gastric bypass she has been unable to tolerate medications and was noted to be experiencing increased symptoms of anxiety including excessive worry, restlessness, increased irritability, and feeling overwhelmed.  With the symptoms she had criteria for generalized anxiety disorder.  [name] presents for follow-up evaluation. Today, 11/07/23, patient reports that   her mood has improved and she has increased her behavioral activation. She did start light therapy and has found it to have some benefit. Patient was unable to tolerate the trazodone  due to headaches and it was discontinued in the interim after she spoke with this provider. She has since started melatonin which has improved her sleep. We will continue on her current regimen and follow up in 2 months.   Plan: - Continue Pristiq  75 mg every day - Discontinue Trazodone  25-50 mg at bedtime prn for insomnia - Continue Vit D 10000 units daily managed by PCP - Patient started light therapy with 10000 Lux light - CMP, CBC,  lipid profile, Vit D, TSH, A1c reviewd - Can consider therapy in the future if needed - Follow up in 2 months  Chief Complaint:  No chief complaint on file.  HPI: On presentation today Rhonda [name] reports that    she is doing good. She had a birthday a month ago which she enjoyed. Rhonda [name] has been feeling a bit more motivated lately and been engaging more outside the house. She has become more active in church in addition to doing things with others. Rhonda [name] attributes the improvement to her daughters motivation, starting light therapy every morning, and the improving weather. Patient also has a cruise to [place] coming up in May which she is excited for.   In regards to sleep she discontinued trazodone  as we had discussed in the interim. Since then [name] has started melatonin which she has found to be effective for sleep. She has only needed it around 1 night a week and will take it if it is 12 am and she is still awake. Rhonda [name] denies any side effect from the gummies and reports that she is sleeping through the night without issue now.  She is taking her Prystiq consistently and denies any adverse side effects.   Past Psychiatric History: Patient reports that her psychiatric care started in 1991 following her father's suicide.  She was diagnosed with depression and seasonal affective disorder.  Patient was tried on a number of medications including Cymbalta , Paxil , Zoloft, Lexapro, Wellbutrin  XL 300 mg (worked well in the past with Pristiq ), and Pristiq .  She denies trying any mood stabilizers or antipsychotics.  Patient notes that she is prescribed gabapentin  for pain and it made  her feel a bit loopy.  Patient attended therapy after separating for her husband and reports it was helpful at the time.  She denies any SI/HI or thoughts of self-harm or any prior suicide attempts as well as any past psychiatric hospitalizations.  Patient denies any substance use.  Past Medical History:  Past Medical History:   Diagnosis Date   Arthritis    knees   Carpal tunnel syndrome of left wrist 07/2016   Depression    History of MRSA infection > 10 years   right leg   Hyperlipidemia    Hypertension    states under control with meds., has been on med. > 2 yr.   OSA (obstructive sleep apnea) 09/05/2019    Past Surgical History:  Procedure Laterality Date   ABDOMINAL HYSTERECTOMY     partial   BREAST EXCISIONAL BIOPSY     CARPAL TUNNEL RELEASE Left 08/26/2016   Procedure: LEFT WRIST CARPAL TUNNEL RELEASE;  Surgeon: Ferd Householder, MD;  Location: [facility];  Service: Orthopedics;  Laterality: Left;   COLONOSCOPY WITH PROPOFOL   03/20/2014   LAPAROSCOPIC UNILATERAL SALPINGECTOMY Right 05/18/2002   OVARY BIOPSY Right 05/18/2002   TRIGGER FINGER RELEASE Left 02/10/2017   Procedure: LEFT TRIGGER THUMB RELEASE (TENDON SHEATH INCISION);  Surgeon: Ferd Householder, MD;  Location: [facility];  Service: Orthopedics;  Laterality: Left;    Family Psychiatric History: Patient reports that her father had depression and committed suicide in 77.  Family History:  Family History  Problem Relation Age of Onset   Breast cancer Maternal Aunt     Social History:  Social History   Socioeconomic History   Marital status: Single    Spouse name: Not on file   Number of children: Not on file   Years of education: Not on file   Highest education level: Associate degree: academic program  Occupational History   Not on file  Tobacco Use   Smoking status: Never   Smokeless tobacco: Never  Vaping Use   Vaping status: Never Used  Substance and Sexual Activity   Alcohol use: No   Drug use: No   Sexual activity: Not on file  Other Topics Concern   Not on file  Social History Narrative   Left handed   Caffeine- 2 daily   Social Drivers of Health   Financial Resource Strain: Patient Declined (07/29/2023)   Overall Financial Resource Strain (CARDIA)    Difficulty of Paying  Living Expenses: Patient declined  Food Insecurity: Patient Declined (07/29/2023)   Hunger Vital Sign    Worried About Running Out of Food in the Last Year: Patient declined    Ran Out of Food in the Last Year: Patient declined  Transportation Needs: Patient Declined (07/29/2023)   PRAPARE - Administrator, Civil Service (Medical): Patient declined    Lack of Transportation (Non-Medical): Patient declined  Physical Activity: Unknown (07/29/2023)   Exercise Vital Sign    Days of Exercise per Week: Patient declined    Minutes of Exercise per Session: Not on file  Stress: Patient Declined (07/29/2023)   [employer] of Occupational Health - Occupational Stress Questionnaire    Feeling of Stress : Patient declined  Social Connections: Unknown (07/29/2023)   Social Connection and Isolation Panel [NHANES]    Frequency of Communication with Friends and Family: Patient declined    Frequency of Social Gatherings with Friends and Family: Patient declined    Attends Religious Services: Patient declined  Active Member of Clubs or Organizations: Patient declined    Attends Banker Meetings: Not on file    Marital Status: Patient declined    Allergies:  Allergies  Allergen Reactions   Penicillins Hives         Statins Other (See Comments)    MYALGIAS   Lisinopril Cough    Current Medications: Current Outpatient Medications  Medication Sig Dispense Refill   acetaminophen  (TYLENOL ) 500 MG tablet Take 500 mg by mouth every 6 (six) hours as needed.     azelastine (ASTELIN) 0.1 % nasal spray Place 1 spray into both nostrils 2 (two) times daily.     azithromycin  (ZITHROMAX ) 250 MG tablet 2 tabs poqday1, 1 tab poqday 2-5 (Patient not taking: Reported on 10/03/2023) 6 tablet 0   Cholecalciferol (DIALYVITE  VITAMIN D  5000) 125 MCG (5000 UT) capsule Take 2 capsules (10,000 Units total) by mouth daily.     desvenlafaxine  (PRISTIQ ) 25 MG 24 hr tablet Take 1 tablet (25 mg  total) by mouth daily. Take with a 50 mg tab for a total of 75 mg daily 90 tablet 0   desvenlafaxine  (PRISTIQ ) 50 MG 24 hr tablet Take 1 tablet (50 mg total) by mouth daily. Take with a 25 mg tab for a total of 75 mg daily 90 tablet 0   doxepin  (SINEQUAN ) 10 MG capsule Take 1 capsule (10 mg total) by mouth at bedtime. 30 capsule 1   ezetimibe  (ZETIA ) 10 MG tablet Take 1 tablet (10 mg total) by mouth daily. 90 tablet 0   fluticasone (FLONASE) 50 MCG/ACT nasal spray Place into both nostrils as needed.      mometasone  (ELOCON ) 0.1 % cream Apply topically daily. 15 g 1   montelukast  (SINGULAIR ) 10 MG tablet TAKE 1 TABLET BY MOUTH EVERYDAY AT BEDTIME 90 tablet 3   Multiple Vitamins-Iron  (DAILY VITAMIN/IRON ) TABS Chelate Iron      omeprazole (PRILOSEC) 40 MG capsule Take 40 mg by mouth daily.     rizatriptan  (MAXALT -MLT) 5 MG disintegrating tablet Take 1 tablet (5 mg total) by mouth as needed. May repeat in 2 hours if needed 10 tablet 6   topiramate  (TOPAMAX ) 25 MG tablet Take 2 tablets (50 mg total) by mouth at bedtime. 180 tablet 3   triamcinolone  cream (KENALOG ) 0.1 % Apply 1 Application topically 2 (two) times daily. 30 g 0   valsartan  (DIOVAN ) 160 MG tablet Take 1 tablet (160 mg total) by mouth daily. 30 tablet 5   VENTOLIN HFA 108 (90 Base) MCG/ACT inhaler Inhale 2 puffs into the lungs every 4 (four) hours as needed for wheezing.     No current facility-administered medications for this visit.     Psychiatric Specialty Exam: Review of Systems  There were no vitals taken for this visit.There is no height or weight on file to calculate BMI.  General Appearance: Fairly Groomed  Eye Contact:  Good  Speech:  Clear and Coherent and Normal Rate  Volume:  Normal  Mood:  Depressed  Affect:  Congruent  Thought Process:  Coherent, Goal Directed, and Linear  Orientation:  Full (Time, Place, and Person)  Thought Content: Logical   Suicidal Thoughts:  No  Homicidal Thoughts:  No  Memory:  NA   Judgement:  Good  Insight:  Good  Psychomotor Activity:  Normal  Concentration:  Concentration: Good  Recall:  Good  Fund of Knowledge: Good  Language: Good  Akathisia:  NA    AIMS (if indicated): not done  Assets:  Communication Skills Desire for Improvement Housing Leisure Time Physical Health Resilience Social Support Talents/Skills Transportation Vocational/Educational  ADL's:  Intact  Cognition: WNL  Sleep:  Fair   Metabolic Disorder Labs: Lab Results  Component Value Date   HGBA1C 5.9 (H) 04/08/2022   MPG 123 04/08/2022   MPG 114 08/06/2019   No results found for: "PROLACTIN" Lab Results  Component Value Date   CHOL 212 (H) 05/07/2022   TRIG 70 05/07/2022   HDL 67 05/07/2022   CHOLHDL 3.2 05/07/2022   VLDL 17 08/02/2016   LDLCALC 129 (H) 05/07/2022   LDLCALC 142 (H) 10/30/2021   Lab Results  Component Value Date   TSH 0.45 09/17/2022   TSH 0.82 10/30/2021    Therapeutic Level Labs: No results found for: "LITHIUM" No results found for: "VALPROATE" No results found for: "CBMZ"   Screenings: GAD-7    Flowsheet Row Office Visit from 03/08/2023 in [facility] Office Visit from 05/25/2022 in [facility] Office Visit from 05/07/2022 in [facility] Office Visit from 10/29/2019 in [facility] Office Visit from 08/06/2019 in [facility]  Total GAD-7 Score 6 0 0 9 21      PHQ2-9    Flowsheet Row Office Visit from 05/30/2023 in [facility] Office Visit from 05/05/2023 in [facility] Office Visit from 03/08/2023 in [facility] Office Visit from 05/25/2022 in [facility] Office Visit from 05/07/2022 in [facility]  PHQ-2 Total Score 0 0 0 0 0  PHQ-9 Total Score -- -- -- 0 1       Collaboration of  Care: Collaboration of Care: Medication Management AEB medication prescription and Other provider involved in patient's care AEB PCP, neuro, and sleep study chart review  Patient/Guardian was advised Release of Information must be obtained prior to any record release in order to collaborate their care with an outside provider. Patient/Guardian was advised if they have not already done so to contact the registration department to sign all necessary forms in order for us  to release information regarding their care.   Consent: Patient/Guardian gives verbal consent for treatment and assignment of benefits for services provided during this visit. Patient/Guardian expressed understanding and agreed to proceed.    Yves Herb, MD 11/07/2023, 10:08 AM   Virtual Visit via Video Note  I connected with Glennda Langton on 11/07/23 at 11:00 AM EDT by a video enabled telemedicine application and verified that I am speaking with the correct person using two identifiers.  Location: Patient: Home Provider: Home Office   I discussed the limitations of evaluation and management by telemedicine and the availability of in person appointments. The patient expressed understanding and agreed to proceed.   I discussed the assessment and treatment plan with the patient. The patient was provided an opportunity to ask questions and all were answered. The patient agreed with the plan and demonstrated an understanding of the instructions.   The patient was advised to call back or seek an in-person evaluation if the symptoms worsen or if the condition fails to improve as anticipated.  I provided 10 minutes of non-face-to-face time during this encounter.   Yves Herb, MD

## 2023-11-10 ENCOUNTER — Encounter (HOSPITAL_COMMUNITY): Admitting: Psychiatry

## 2023-11-10 ENCOUNTER — Encounter (HOSPITAL_COMMUNITY): Payer: Self-pay

## 2023-11-10 NOTE — Progress Notes (Signed)
 This encounter was created in error - please disregard.  Patient did not show up for the appointment. Appointment reminders were sent to her phone and email. Attempted to call the at 11:06 and 11:12 patient but received no response. Left a voicemail for patient to reschedule.

## 2023-11-20 DIAGNOSIS — U071 COVID-19: Secondary | ICD-10-CM | POA: Diagnosis not present

## 2023-11-20 DIAGNOSIS — R059 Cough, unspecified: Secondary | ICD-10-CM | POA: Diagnosis not present

## 2023-11-24 ENCOUNTER — Telehealth: Admitting: Family Medicine

## 2023-11-24 DIAGNOSIS — R051 Acute cough: Secondary | ICD-10-CM

## 2023-11-24 MED ORDER — PROMETHAZINE-DM 6.25-15 MG/5ML PO SYRP
5.0000 mL | ORAL_SOLUTION | Freq: Three times a day (TID) | ORAL | 0 refills | Status: AC | PRN
Start: 2023-11-24 — End: ?

## 2023-11-24 MED ORDER — PREDNISONE 20 MG PO TABS: 40.0000 mg | ORAL_TABLET | Freq: Every day | ORAL | 0 refills | Status: AC

## 2023-11-24 NOTE — Progress Notes (Signed)
 Virtual Visit Consent   Rhonda [name], you are scheduled for a virtual visit with a [facility] provider today. Just as with appointments in the office, your consent must be obtained to participate. Your consent will be active for this visit and any virtual visit you may have with one of our providers in the next 365 days. If you have a MyChart account, a copy of this consent can be sent to you electronically.  As this is a virtual visit, video technology does not allow for your provider to perform a traditional examination. This may limit your provider's ability to fully assess your condition. If your provider identifies any concerns that need to be evaluated in person or the need to arrange testing (such as labs, EKG, etc.), we will make arrangements to do so. Although advances in technology are sophisticated, we cannot ensure that it will always work on either your end or our end. If the connection with a video visit is poor, the visit may have to be switched to a telephone visit. With either a video or telephone visit, we are not always able to ensure that we have a secure connection.  By engaging in this virtual visit, you consent to the provision of healthcare and authorize for your insurance to be billed (if applicable) for the services provided during this visit. Depending on your insurance coverage, you may receive a charge related to this service.  I need to obtain your verbal consent now. Are you willing to proceed with your visit today? Anida Deol has provided verbal consent on 11/24/2023 for a virtual visit (video or telephone). Lanetta Pion, NP  Date: 11/24/2023 11:02 AM   Virtual Visit via Video Note   I, Lanetta Pion, connected with  Rhonda [name]  ([id number], Jan 06, 1961) on 11/24/23 at 11:00 AM EDT by a video-enabled telemedicine application and verified that I am speaking with the correct person using two identifiers.  Location: Patient: Virtual Visit Location  Patient: Home Provider: Virtual Visit Location Provider: Home Office   I discussed the limitations of evaluation and management by telemedicine and the availability of in person appointments. The patient expressed understanding and agreed to proceed.    History of Present Illness: Rhonda [name] is a 63 y.o. who identifies as a female who was assigned female at birth, and is being seen today for   COVID post international travel Cough is getting worse, took her Paxlvoid- has the last day today, using inhaler (not much help), and tessalon  as well as OTC cough syrup, nothing is helping her much. Her hoarseness is increasing, cough spells that are taking the breath. She has a special needs son that also has covid and she can not leave him.  Overall feel better, outside the cough, reports an increase in runny nose as well.    Problems:  Patient Active Problem List   Diagnosis Date Noted   Gastroesophageal reflux disease without esophagitis 05/05/2023   Chest pain 05/05/2023   Left lower quadrant abdominal pain 05/05/2023   Atypical chest pain 05/05/2023   GAD (generalized anxiety disorder) 11/09/2022   History of gastric bypass 07/09/2022   Postgastrectomy malabsorption 07/09/2022   Physical exam, annual 05/25/2022   OSA (obstructive sleep apnea) 09/05/2019   Depression, major, single episode, moderate (HCC) 10/24/2017   Migraines 02/14/2015   Chronic constipation 02/14/2015   Hyperlipidemia 08/02/2014   Generalized OA 12/19/2013   Essential hypertension, benign 12/14/2012   Vaginal atrophy 12/14/2012  Allergies:  Allergies  Allergen Reactions   Penicillins Hives         Statins Other (See Comments)    MYALGIAS   Lisinopril Cough   Medications:  Current Outpatient Medications:    acetaminophen  (TYLENOL ) 500 MG tablet, Take 500 mg by mouth every 6 (six) hours as needed., Disp: , Rfl:    azelastine (ASTELIN) 0.1 % nasal spray, Place 1 spray into both nostrils 2 (two)  times daily., Disp: , Rfl:    azithromycin  (ZITHROMAX ) 250 MG tablet, 2 tabs poqday1, 1 tab poqday 2-5 (Patient not taking: Reported on 10/03/2023), Disp: 6 tablet, Rfl: 0   Cholecalciferol (DIALYVITE  VITAMIN D  5000) 125 MCG (5000 UT) capsule, Take 2 capsules (10,000 Units total) by mouth daily., Disp: , Rfl:    desvenlafaxine  (PRISTIQ ) 25 MG 24 hr tablet, Take 1 tablet (25 mg total) by mouth daily. Take with a 50 mg tab for a total of 75 mg daily, Disp: 90 tablet, Rfl: 0   desvenlafaxine  (PRISTIQ ) 50 MG 24 hr tablet, Take 1 tablet (50 mg total) by mouth daily. Take with a 25 mg tab for a total of 75 mg daily, Disp: 90 tablet, Rfl: 0   doxepin  (SINEQUAN ) 10 MG capsule, Take 1 capsule (10 mg total) by mouth at bedtime., Disp: 30 capsule, Rfl: 1   ezetimibe  (ZETIA ) 10 MG tablet, Take 1 tablet (10 mg total) by mouth daily., Disp: 90 tablet, Rfl: 0   fluticasone (FLONASE) 50 MCG/ACT nasal spray, Place into both nostrils as needed. , Disp: , Rfl:    mometasone  (ELOCON ) 0.1 % cream, Apply topically daily., Disp: 15 g, Rfl: 1   montelukast  (SINGULAIR ) 10 MG tablet, TAKE 1 TABLET BY MOUTH EVERYDAY AT BEDTIME, Disp: 90 tablet, Rfl: 3   Multiple Vitamins-Iron  (DAILY VITAMIN/IRON ) TABS, Chelate Iron , Disp: , Rfl:    omeprazole (PRILOSEC) 40 MG capsule, Take 40 mg by mouth daily., Disp: , Rfl:    rizatriptan  (MAXALT -MLT) 5 MG disintegrating tablet, Take 1 tablet (5 mg total) by mouth as needed. May repeat in 2 hours if needed, Disp: 10 tablet, Rfl: 6   topiramate  (TOPAMAX ) 25 MG tablet, Take 2 tablets (50 mg total) by mouth at bedtime., Disp: 180 tablet, Rfl: 3   triamcinolone  cream (KENALOG ) 0.1 %, Apply 1 Application topically 2 (two) times daily., Disp: 30 g, Rfl: 0   valsartan  (DIOVAN ) 160 MG tablet, Take 1 tablet (160 mg total) by mouth daily., Disp: 30 tablet, Rfl: 5   VENTOLIN HFA 108 (90 Base) MCG/ACT inhaler, Inhale 2 puffs into the lungs every 4 (four) hours as needed for wheezing., Disp: , Rfl:    Observations/Objective: Patient is well-developed, well-nourished in no acute distress.  Resting comfortably  at home.  Head is normocephalic, atraumatic.  No labored breathing.  Speech is clear and coherent with logical content.  Patient is alert and oriented at baseline.  Hoarseness and cough present   Assessment and Plan:  1. Acute cough (Primary)  - predniSONE  (DELTASONE ) 20 MG tablet; Take 2 tablets (40 mg total) by mouth daily with breakfast for 5 days.  Dispense: 10 tablet; Refill: 0 - promethazine-dextromethorphan (PROMETHAZINE-DM) 6.25-15 MG/5ML syrup; Take 5 mLs by mouth 3 (three) times daily as needed for cough.  Dispense: 118 mL; Refill: 0  -covid  cough - treatment provided  -in person if not improving  Reviewed side effects, risks and benefits of medication.    Patient acknowledged agreement and understanding of the plan.   Past Medical, Surgical,  Social History, Allergies, and Medications have been Reviewed.    Follow Up Instructions: I discussed the assessment and treatment plan with the patient. The patient was provided an opportunity to ask questions and all were answered. The patient agreed with the plan and demonstrated an understanding of the instructions.  A copy of instructions were sent to the patient via MyChart unless otherwise noted below.    The patient was advised to call back or seek an in-person evaluation if the symptoms worsen or if the condition fails to improve as anticipated.    Lanetta Pion, NP

## 2023-11-24 NOTE — Patient Instructions (Signed)
 Rhonda [name], thank you for joining Lanetta Pion, NP for today's virtual visit.  While this provider is not your primary care provider (PCP), if your PCP is located in our provider database this encounter information will be shared with them immediately following your visit.   A [facility] MyChart account gives you access to today's visit and all your visits, tests, and labs performed at [facility] " click here if you don't have a [facility] MyChart account or go to [email or web address]  Consent: (Patient) Rhonda [name] provided verbal consent for this virtual visit at the beginning of the encounter.  Current Medications:  Current Outpatient Medications:    predniSONE  (DELTASONE ) 20 MG tablet, Take 2 tablets (40 mg total) by mouth daily with breakfast for 5 days., Disp: 10 tablet, Rfl: 0   promethazine-dextromethorphan (PROMETHAZINE-DM) 6.25-15 MG/5ML syrup, Take 5 mLs by mouth 3 (three) times daily as needed for cough., Disp: 118 mL, Rfl: 0   acetaminophen  (TYLENOL ) 500 MG tablet, Take 500 mg by mouth every 6 (six) hours as needed., Disp: , Rfl:    azelastine (ASTELIN) 0.1 % nasal spray, Place 1 spray into both nostrils 2 (two) times daily., Disp: , Rfl:    azithromycin  (ZITHROMAX ) 250 MG tablet, 2 tabs poqday1, 1 tab poqday 2-5 (Patient not taking: Reported on 10/03/2023), Disp: 6 tablet, Rfl: 0   Cholecalciferol (DIALYVITE  VITAMIN D  5000) 125 MCG (5000 UT) capsule, Take 2 capsules (10,000 Units total) by mouth daily., Disp: , Rfl:    desvenlafaxine  (PRISTIQ ) 25 MG 24 hr tablet, Take 1 tablet (25 mg total) by mouth daily. Take with a 50 mg tab for a total of 75 mg daily, Disp: 90 tablet, Rfl: 0   desvenlafaxine  (PRISTIQ ) 50 MG 24 hr tablet, Take 1 tablet (50 mg total) by mouth daily. Take with a 25 mg tab for a total of 75 mg daily, Disp: 90 tablet, Rfl: 0   doxepin  (SINEQUAN ) 10 MG capsule, Take 1 capsule (10 mg total) by mouth at bedtime., Disp: 30 capsule, Rfl:  1   ezetimibe  (ZETIA ) 10 MG tablet, Take 1 tablet (10 mg total) by mouth daily., Disp: 90 tablet, Rfl: 0   fluticasone (FLONASE) 50 MCG/ACT nasal spray, Place into both nostrils as needed. , Disp: , Rfl:    mometasone  (ELOCON ) 0.1 % cream, Apply topically daily., Disp: 15 g, Rfl: 1   montelukast  (SINGULAIR ) 10 MG tablet, TAKE 1 TABLET BY MOUTH EVERYDAY AT BEDTIME, Disp: 90 tablet, Rfl: 3   Multiple Vitamins-Iron  (DAILY VITAMIN/IRON ) TABS, Chelate Iron , Disp: , Rfl:    omeprazole (PRILOSEC) 40 MG capsule, Take 40 mg by mouth daily., Disp: , Rfl:    rizatriptan  (MAXALT -MLT) 5 MG disintegrating tablet, Take 1 tablet (5 mg total) by mouth as needed. May repeat in 2 hours if needed, Disp: 10 tablet, Rfl: 6   topiramate  (TOPAMAX ) 25 MG tablet, Take 2 tablets (50 mg total) by mouth at bedtime., Disp: 180 tablet, Rfl: 3   triamcinolone  cream (KENALOG ) 0.1 %, Apply 1 Application topically 2 (two) times daily., Disp: 30 g, Rfl: 0   valsartan  (DIOVAN ) 160 MG tablet, Take 1 tablet (160 mg total) by mouth daily., Disp: 30 tablet, Rfl: 5   VENTOLIN HFA 108 (90 Base) MCG/ACT inhaler, Inhale 2 puffs into the lungs every 4 (four) hours as needed for wheezing., Disp: , Rfl:    Medications ordered in this encounter:  Meds ordered this encounter  Medications   predniSONE  (DELTASONE ) 20 MG tablet  Sig: Take 2 tablets (40 mg total) by mouth daily with breakfast for 5 days.    Dispense:  10 tablet    Refill:  0    Supervising Provider:   LAMPTEY, PHILIP O [[id number]]   promethazine-dextromethorphan (PROMETHAZINE-DM) 6.25-15 MG/5ML syrup    Sig: Take 5 mLs by mouth 3 (three) times daily as needed for cough.    Dispense:  118 mL    Refill:  0    Supervising Provider:   Corine Dice [[id number]]     *If you need refills on other medications prior to your next appointment, please contact your pharmacy*  Follow-Up: Call back or seek an in-person evaluation if the symptoms worsen or if the condition fails to  improve as anticipated.  [facility] [phone]  Other Instructions  - Take meds as prescribed - Rest voice - Stay hydrated by drinking plenty of fluids   If you do not improve you will need a follow up visit in person.                  If you have been instructed to have an in-person evaluation today at a local Urgent Care facility, please use the link below. It will take you to a list of all of our available [facility], including address, phone number and hours of operation. Please do not delay care.  [facility]  If you or a family member do not have a primary care provider, use the link below to schedule a visit and establish care. When you choose a [facility] primary care physician or advanced practice provider, you gain a long-term partner in health. Find a Primary Care Provider  Learn more about [facility] in-office and virtual care options: [facility] - Get Care Now

## 2023-12-06 DIAGNOSIS — G4733 Obstructive sleep apnea (adult) (pediatric): Secondary | ICD-10-CM | POA: Diagnosis not present

## 2023-12-19 DIAGNOSIS — L811 Chloasma: Secondary | ICD-10-CM | POA: Diagnosis not present

## 2023-12-19 DIAGNOSIS — L219 Seborrheic dermatitis, unspecified: Secondary | ICD-10-CM | POA: Diagnosis not present

## 2023-12-19 NOTE — Progress Notes (Deleted)
 BH MD/PA/NP OP Progress Note  12/19/2023 11:23 AM Rhonda [name]  MRN:  [id number]  Visit Diagnosis:  No diagnosis found.   Assessment: Rhonda [name] is a 63 y.o. female with a history of depression, moderate obstructive sleep apnea who presented to [facility] for initial evaluation on 04/15/22.    During initial evaluation patient reported symptoms of anxiety, increased irritability, increased fatigue, decreased concentration, low mood, and increased need to sleep.  Patient reported that her symptoms tend to be worse in the fall and winter.  She denied any suicidal ideation or thoughts of self-harm along with any history of psychosis, mania, paranoia, or delusions.  Of note patient has sleep apnea which is well-controlled with CPAP, and vitamin D  deficiency which is well-controlled.  Patient's symptoms meet criteria for MDD at time of initial evaluation.  Following patient's gastric bypass she has been unable to tolerate medications and was noted to be experiencing increased symptoms of anxiety including excessive worry, restlessness, increased irritability, and feeling overwhelmed.  With the symptoms she had criteria for generalized anxiety disorder.  Rhonda [name] presents for follow-up evaluation. Today, 12/19/23, patient reports that    her mood has improved and she has increased her behavioral activation. She did start light therapy and has found it to have some benefit. Patient was unable to tolerate the trazodone  due to headaches and it was discontinued in the interim after she spoke with this provider. She has since started melatonin which has improved her sleep. We will continue on her current regimen and follow up in 2 months.   Plan: - Continue Pristiq  75 mg every day - Discontinue Trazodone  25-50 mg at bedtime prn for insomnia - Continue Vit D 10000 units daily managed by PCP - Patient started light therapy with 10000 Lux light -  CMP, CBC, lipid profile, Vit D, TSH, A1c reviewed - Sleep study from April 2025 reviewed, showed moderate sleep apnea - Can consider therapy in the future if needed - Follow up in 2 months  Chief Complaint:  No chief complaint on file.  HPI: On presentation today Rhonda [name] reports that     she is doing good. She had a birthday a month ago which she enjoyed. Rhonda [name] has been feeling a bit more motivated lately and been engaging more outside the house. She has become more active in church in addition to doing things with others. Rhonda [name] attributes the improvement to her daughters motivation, starting light therapy every morning, and the improving weather. Patient also has a cruise to [place] coming up in May which she is excited for.   In regards to sleep she discontinued trazodone  as we had discussed in the interim. Since then Rhonda [name] has started melatonin which she has found to be effective for sleep. She has only needed it around 1 night a week and will take it if it is 12 am and she is still awake. Rhonda [name] denies any side effect from the gummies and reports that she is sleeping through the night without issue now.  She is taking her Prystiq consistently and denies any adverse side effects.   Past Psychiatric History: Patient reports that her psychiatric care started in 1991 following her father's suicide.  She was diagnosed with depression and seasonal affective disorder.  Patient was tried on a number of medications including Cymbalta , Paxil , Zoloft, Lexapro, Wellbutrin  XL 300 mg (worked well in the past with Pristiq ), and Pristiq .  She denies trying any mood stabilizers or  antipsychotics.  Patient notes that she is prescribed gabapentin  for pain and it made her feel a bit loopy.  Patient attended therapy after separating for her husband and reports it was helpful at the time.  She denies any SI/HI or thoughts of self-harm or any prior suicide attempts as well as any past psychiatric  hospitalizations.  Patient denies any substance use.  Past Medical History:  Past Medical History:  Diagnosis Date   Arthritis    knees   Carpal tunnel syndrome of left wrist 07/2016   Depression    History of MRSA infection > 10 years   right leg   Hyperlipidemia    Hypertension    states under control with meds., has been on med. > 2 yr.   OSA (obstructive sleep apnea) 09/05/2019    Past Surgical History:  Procedure Laterality Date   ABDOMINAL HYSTERECTOMY     partial   BREAST EXCISIONAL BIOPSY     CARPAL TUNNEL RELEASE Left 08/26/2016   Procedure: LEFT WRIST CARPAL TUNNEL RELEASE;  Surgeon: Toribio JULIANNA Chancy, MD;  Location: [facility];  Service: Orthopedics;  Laterality: Left;   COLONOSCOPY WITH PROPOFOL   03/20/2014   LAPAROSCOPIC UNILATERAL SALPINGECTOMY Right 05/18/2002   OVARY BIOPSY Right 05/18/2002   TRIGGER FINGER RELEASE Left 02/10/2017   Procedure: LEFT TRIGGER THUMB RELEASE (TENDON SHEATH INCISION);  Surgeon: Chancy Toribio JULIANNA, MD;  Location: [facility];  Service: Orthopedics;  Laterality: Left;    Family Psychiatric History: Patient reports that her father had depression and committed suicide in 30.  Family History:  Family History  Problem Relation Age of Onset   Breast cancer Maternal Aunt     Social History:  Social History   Socioeconomic History   Marital status: Single    Spouse name: Not on file   Number of children: Not on file   Years of education: Not on file   Highest education level: Associate degree: academic program  Occupational History   Not on file  Tobacco Use   Smoking status: Never   Smokeless tobacco: Never  Vaping Use   Vaping status: Never Used  Substance and Sexual Activity   Alcohol use: No   Drug use: No   Sexual activity: Not on file  Other Topics Concern   Not on file  Social History Narrative   Left handed   Caffeine- 2 daily   Social Drivers of Health   Financial Resource Strain:  Patient Declined (07/29/2023)   Overall Financial Resource Strain (CARDIA)    Difficulty of Paying Living Expenses: Patient declined  Food Insecurity: Patient Declined (07/29/2023)   Hunger Vital Sign    Worried About Running Out of Food in the Last Year: Patient declined    Ran Out of Food in the Last Year: Patient declined  Transportation Needs: Patient Declined (07/29/2023)   PRAPARE - Administrator, Civil Service (Medical): Patient declined    Lack of Transportation (Non-Medical): Patient declined  Physical Activity: Unknown (07/29/2023)   Exercise Vital Sign    Days of Exercise per Week: Patient declined    Minutes of Exercise per Session: Not on file  Stress: Patient Declined (07/29/2023)   [employer] of Occupational Health - Occupational Stress Questionnaire    Feeling of Stress : Patient declined  Social Connections: Unknown (07/29/2023)   Social Connection and Isolation Panel    Frequency of Communication with Friends and Family: Patient declined    Frequency of Social Gatherings with  Friends and Family: Patient declined    Attends Religious Services: Patient declined    Active Member of Clubs or Organizations: Patient declined    Attends Banker Meetings: Not on file    Marital Status: Patient declined    Allergies:  Allergies  Allergen Reactions   Penicillins Hives         Statins Other (See Comments)    MYALGIAS   Lisinopril Cough    Current Medications: Current Outpatient Medications  Medication Sig Dispense Refill   acetaminophen  (TYLENOL ) 500 MG tablet Take 500 mg by mouth every 6 (six) hours as needed.     azelastine (ASTELIN) 0.1 % nasal spray Place 1 spray into both nostrils 2 (two) times daily.     azithromycin  (ZITHROMAX ) 250 MG tablet 2 tabs poqday1, 1 tab poqday 2-5 (Patient not taking: Reported on 10/03/2023) 6 tablet 0   Cholecalciferol (DIALYVITE  VITAMIN D  5000) 125 MCG (5000 UT) capsule Take 2 capsules (10,000 Units  total) by mouth daily.     desvenlafaxine  (PRISTIQ ) 25 MG 24 hr tablet Take 1 tablet (25 mg total) by mouth daily. Take with a 50 mg tab for a total of 75 mg daily 90 tablet 0   desvenlafaxine  (PRISTIQ ) 50 MG 24 hr tablet Take 1 tablet (50 mg total) by mouth daily. Take with a 25 mg tab for a total of 75 mg daily 90 tablet 0   doxepin  (SINEQUAN ) 10 MG capsule Take 1 capsule (10 mg total) by mouth at bedtime. 30 capsule 1   ezetimibe  (ZETIA ) 10 MG tablet Take 1 tablet (10 mg total) by mouth daily. 90 tablet 0   fluticasone (FLONASE) 50 MCG/ACT nasal spray Place into both nostrils as needed.      mometasone  (ELOCON ) 0.1 % cream Apply topically daily. 15 g 1   montelukast  (SINGULAIR ) 10 MG tablet TAKE 1 TABLET BY MOUTH EVERYDAY AT BEDTIME 90 tablet 3   Multiple Vitamins-Iron  (DAILY VITAMIN/IRON ) TABS Chelate Iron      omeprazole (PRILOSEC) 40 MG capsule Take 40 mg by mouth daily.     promethazine -dextromethorphan (PROMETHAZINE -DM) 6.25-15 MG/5ML syrup Take 5 mLs by mouth 3 (three) times daily as needed for cough. 118 mL 0   rizatriptan  (MAXALT -MLT) 5 MG disintegrating tablet Take 1 tablet (5 mg total) by mouth as needed. May repeat in 2 hours if needed 10 tablet 6   topiramate  (TOPAMAX ) 25 MG tablet Take 2 tablets (50 mg total) by mouth at bedtime. 180 tablet 3   triamcinolone  cream (KENALOG ) 0.1 % Apply 1 Application topically 2 (two) times daily. 30 g 0   valsartan  (DIOVAN ) 160 MG tablet Take 1 tablet (160 mg total) by mouth daily. 30 tablet 5   VENTOLIN HFA 108 (90 Base) MCG/ACT inhaler Inhale 2 puffs into the lungs every 4 (four) hours as needed for wheezing.     No current facility-administered medications for this visit.     Psychiatric Specialty Exam: Review of Systems  There were no vitals taken for this visit.There is no height or weight on file to calculate BMI.  General Appearance: Fairly Groomed  Eye Contact:  Good  Speech:  Clear and Coherent and Normal Rate  Volume:  Normal   Mood:  Depressed  Affect:  Congruent  Thought Process:  Coherent, Goal Directed, and Linear  Orientation:  Full (Time, Place, and Person)  Thought Content: Logical   Suicidal Thoughts:  No  Homicidal Thoughts:  No  Memory:  NA  Judgement:  Good  Insight:  Good  Psychomotor Activity:  Normal  Concentration:  Concentration: Good  Recall:  Good  Fund of Knowledge: Good  Language: Good  Akathisia:  NA    AIMS (if indicated): not done  Assets:  Communication Skills Desire for Improvement Housing Leisure Time Physical Health Resilience Social Support Talents/Skills Transportation Vocational/Educational  ADL's:  Intact  Cognition: WNL  Sleep:  Fair   Metabolic Disorder Labs: Lab Results  Component Value Date   HGBA1C 5.9 (H) 04/08/2022   MPG 123 04/08/2022   MPG 114 08/06/2019   No results found for: PROLACTIN Lab Results  Component Value Date   CHOL 212 (H) 05/07/2022   TRIG 70 05/07/2022   HDL 67 05/07/2022   CHOLHDL 3.2 05/07/2022   VLDL 17 08/02/2016   LDLCALC 129 (H) 05/07/2022   LDLCALC 142 (H) 10/30/2021   Lab Results  Component Value Date   TSH 0.45 09/17/2022   TSH 0.82 10/30/2021    Therapeutic Level Labs: No results found for: LITHIUM No results found for: VALPROATE No results found for: CBMZ   Screenings: GAD-7    Flowsheet Row Office Visit from 03/08/2023 in [facility] Office Visit from 05/25/2022 in [facility] Office Visit from 05/07/2022 in [facility] Office Visit from 10/29/2019 in [facility] Office Visit from 08/06/2019 in [facility]  Total GAD-7 Score 6 0 0 9 21   PHQ2-9    Flowsheet Row Office Visit from 05/30/2023 in [facility] Office Visit from 05/05/2023 in [facility] Office Visit from 03/08/2023 in [facility]  Visit from 05/25/2022 in [facility] Office Visit from 05/07/2022 in [facility]  PHQ-2 Total Score 0 0 0 0 0  PHQ-9 Total Score -- -- -- 0 1    Collaboration of Care: Collaboration of Care: Medication Management AEB medication prescription and Other provider involved in patient's care AEB PCP, sleep medicine, and urgent care chart review  Patient/Guardian was advised Release of Information must be obtained prior to any record release in order to collaborate their care with an outside provider. Patient/Guardian was advised if they have not already done so to contact the registration department to sign all necessary forms in order for us  to release information regarding their care.   Consent: Patient/Guardian gives verbal consent for treatment and assignment of benefits for services provided during this visit. Patient/Guardian expressed understanding and agreed to proceed.    Rhonda CHRISTELLA Finder, MD 12/19/2023, 11:23 AM   Virtual Visit via Video Note  I connected with Rhonda [name] on 12/19/23 at 11:00 AM EDT by a video enabled telemedicine application and verified that I am speaking with the correct person using two identifiers.  Location: Patient: Home Provider: Home Office   I discussed the limitations of evaluation and management by telemedicine and the availability of in person appointments. The patient expressed understanding and agreed to proceed.   I discussed the assessment and treatment plan with the patient. The patient was provided an opportunity to ask questions and all were answered. The patient agreed with the plan and demonstrated an understanding of the instructions.   The patient was advised to call back or seek an in-person evaluation if the symptoms worsen or if the condition fails to improve as anticipated.  I provided 10 minutes of non-face-to-face  time during this encounter.   Rhonda CHRISTELLA Finder, MD

## 2023-12-20 ENCOUNTER — Other Ambulatory Visit (HOSPITAL_COMMUNITY): Payer: Self-pay | Admitting: Psychiatry

## 2023-12-20 DIAGNOSIS — F321 Major depressive disorder, single episode, moderate: Secondary | ICD-10-CM

## 2023-12-20 DIAGNOSIS — F411 Generalized anxiety disorder: Secondary | ICD-10-CM

## 2023-12-20 MED ORDER — DESVENLAFAXINE SUCCINATE ER 25 MG PO TB24
25.0000 mg | ORAL_TABLET | Freq: Every day | ORAL | 0 refills | Status: DC
Start: 2023-12-20 — End: 2024-02-16

## 2023-12-20 MED ORDER — DESVENLAFAXINE SUCCINATE ER 50 MG PO TB24
50.0000 mg | ORAL_TABLET | Freq: Every day | ORAL | 0 refills | Status: DC
Start: 2023-12-20 — End: 2024-02-16

## 2023-12-22 ENCOUNTER — Telehealth (HOSPITAL_COMMUNITY): Admitting: Psychiatry

## 2024-01-05 DIAGNOSIS — G4733 Obstructive sleep apnea (adult) (pediatric): Secondary | ICD-10-CM | POA: Diagnosis not present

## 2024-01-16 ENCOUNTER — Other Ambulatory Visit: Payer: Self-pay | Admitting: Family Medicine

## 2024-02-02 ENCOUNTER — Encounter: Payer: Self-pay | Admitting: Family Medicine

## 2024-02-02 ENCOUNTER — Ambulatory Visit: Admitting: Family Medicine

## 2024-02-02 VITALS — BP 140/88 | HR 74 | Temp 98.2°F | Ht 64.0 in | Wt 136.2 lb

## 2024-02-02 DIAGNOSIS — Z1211 Encounter for screening for malignant neoplasm of colon: Secondary | ICD-10-CM

## 2024-02-02 DIAGNOSIS — M2550 Pain in unspecified joint: Secondary | ICD-10-CM | POA: Diagnosis not present

## 2024-02-02 DIAGNOSIS — R051 Acute cough: Secondary | ICD-10-CM

## 2024-02-02 DIAGNOSIS — E7800 Pure hypercholesterolemia, unspecified: Secondary | ICD-10-CM | POA: Diagnosis not present

## 2024-02-02 LAB — LIPID PANEL
Cholesterol: 194 mg/dL (ref <200)
HDL: 79 mg/dL (ref >=50)
LDL Cholesterol (Calc): 101 mg/dL — ABNORMAL HIGH
Non-HDL Cholesterol (Calc): 115 mg/dL (ref <130)
Total CHOL/HDL Ratio: 2.5 (calc) (ref <5.0)
Triglycerides: 55 mg/dL (ref <150)

## 2024-02-02 MED ORDER — VENTOLIN HFA 108 (90 BASE) MCG/ACT IN AERS
2.0000 | INHALATION_SPRAY | RESPIRATORY_TRACT | 3 refills | Status: AC | PRN
Start: 2024-02-02 — End: ?

## 2024-02-02 MED ORDER — HYDROCORTISONE 2.5 % EX CREA: TOPICAL_CREAM | Freq: Two times a day (BID) | CUTANEOUS | 1 refills | Status: AC

## 2024-02-02 MED ORDER — KETOCONAZOLE 2 % EX SHAM: 1.0000 | MEDICATED_SHAMPOO | CUTANEOUS | 11 refills | Status: AC

## 2024-02-02 NOTE — Progress Notes (Signed)
 Subjective:    Patient ID: Rhonda [name], female    DOB: 02-Apr-1961, 63 y.o.   MRN: [id number]  Toe Pain   Patient has several concerns.  First she dropped a knife on her right big toe.  Landed on the toenail.  There is no erythema in the toe.  There is no swelling.  There is no evidence of cellulitis.  The toe is just sore.  Second she reports diffuse pain all over her body.  She states that she is sore everywhere.  She complains of soreness of pain in her lower back and her shoulders.  She also has pain in her knees.  However the worst pain is in the DIP and PIP joints on her hands.  She is starting to form what looks to be Heberden's nodules.  I suspect osteoarthritis but the patient is concerned about possible rheumatoid arthritis.  Third the patient would like a colonoscopy.  Fourth the patient has what appears to be seborrheic dermatitis on the forehead.  She is using cortisone cream which helps slightly with the itching but it will not go away.  The rash appears to be mild erythema at the base of her scalp and above her eyebrows.  Feels the patient has an external hemorrhoid that is irritated and inflamed and painful Past Medical History:  Diagnosis Date   Arthritis    knees   Carpal tunnel syndrome of left wrist 07/2016   Depression    History of MRSA infection > 10 years   right leg   Hyperlipidemia    Hypertension    states under control with meds., has been on med. > 2 yr.   OSA (obstructive sleep apnea) 09/05/2019   Past Surgical History:  Procedure Laterality Date   ABDOMINAL HYSTERECTOMY     partial   BREAST EXCISIONAL BIOPSY     CARPAL TUNNEL RELEASE Left 08/26/2016   Procedure: LEFT WRIST CARPAL TUNNEL RELEASE;  Surgeon: Toribio JULIANNA Chancy, MD;  Location: [facility];  Service: Orthopedics;  Laterality: Left;   COLONOSCOPY WITH PROPOFOL   03/20/2014   LAPAROSCOPIC UNILATERAL SALPINGECTOMY Right 05/18/2002   OVARY BIOPSY Right 05/18/2002   TRIGGER FINGER  RELEASE Left 02/10/2017   Procedure: LEFT TRIGGER THUMB RELEASE (TENDON SHEATH INCISION);  Surgeon: Chancy Toribio JULIANNA, MD;  Location:  SURGERY CENTER;  Service: Orthopedics;  Laterality: Left;   Current Outpatient Medications on File Prior to Visit  Medication Sig Dispense Refill   acetaminophen  (TYLENOL ) 500 MG tablet Take 500 mg by mouth every 6 (six) hours as needed.     azelastine (ASTELIN) 0.1 % nasal spray Place 1 spray into both nostrils 2 (two) times daily.     Cholecalciferol (DIALYVITE  VITAMIN D  5000) 125 MCG (5000 UT) capsule Take 2 capsules (10,000 Units total) by mouth daily.     desvenlafaxine  (PRISTIQ ) 25 MG 24 hr tablet Take 1 tablet (25 mg total) by mouth daily. Take with a 50 mg tab for a total of 75 mg daily 90 tablet 0   desvenlafaxine  (PRISTIQ ) 50 MG 24 hr tablet Take 1 tablet (50 mg total) by mouth daily. Take with a 25 mg tab for a total of 75 mg daily 90 tablet 0   doxepin  (SINEQUAN ) 10 MG capsule Take 1 capsule (10 mg total) by mouth at bedtime. 30 capsule 1   ezetimibe  (ZETIA ) 10 MG tablet TAKE 1 TABLET BY MOUTH EVERY DAY 90 tablet 0   fluticasone (FLONASE) 50 MCG/ACT nasal spray Place  into both nostrils as needed.      mometasone  (ELOCON ) 0.1 % cream Apply topically daily. 15 g 1   montelukast  (SINGULAIR ) 10 MG tablet TAKE 1 TABLET BY MOUTH EVERYDAY AT BEDTIME 90 tablet 3   Multiple Vitamins-Iron  (DAILY VITAMIN/IRON ) TABS Chelate Iron      omeprazole (PRILOSEC) 40 MG capsule Take 40 mg by mouth daily.     promethazine -dextromethorphan (PROMETHAZINE -DM) 6.25-15 MG/5ML syrup Take 5 mLs by mouth 3 (three) times daily as needed for cough. 118 mL 0   rizatriptan  (MAXALT -MLT) 5 MG disintegrating tablet Take 1 tablet (5 mg total) by mouth as needed. May repeat in 2 hours if needed 10 tablet 6   topiramate  (TOPAMAX ) 25 MG tablet Take 2 tablets (50 mg total) by mouth at bedtime. 180 tablet 3   triamcinolone  cream (KENALOG ) 0.1 % Apply 1 Application topically 2 (two) times  daily. 30 g 0   valsartan  (DIOVAN ) 160 MG tablet Take 1 tablet (160 mg total) by mouth daily. 30 tablet 5   azithromycin  (ZITHROMAX ) 250 MG tablet 2 tabs poqday1, 1 tab poqday 2-5 (Patient not taking: Reported on 02/02/2024) 6 tablet 0   No current facility-administered medications on file prior to visit.   Allergies  Allergen Reactions   Penicillins Hives         Statins Other (See Comments)    MYALGIAS   Lisinopril Cough   Social History   Socioeconomic History   Marital status: Single    Spouse name: Not on file   Number of children: Not on file   Years of education: Not on file   Highest education level: Associate degree: academic program  Occupational History   Not on file  Tobacco Use   Smoking status: Never   Smokeless tobacco: Never  Vaping Use   Vaping status: Never Used  Substance and Sexual Activity   Alcohol use: No   Drug use: No   Sexual activity: Not on file  Other Topics Concern   Not on file  Social History Narrative   Left handed   Caffeine- 2 daily   Social Drivers of Health   Financial Resource Strain: Patient Declined (02/02/2024)   Overall Financial Resource Strain (CARDIA)    Difficulty of Paying Living Expenses: Patient declined  Food Insecurity: Patient Declined (02/02/2024)   Hunger Vital Sign    Worried About Running Out of Food in the Last Year: Patient declined    Ran Out of Food in the Last Year: Patient declined  Transportation Needs: Patient Declined (02/02/2024)   PRAPARE - Administrator, Civil Service (Medical): Patient declined    Lack of Transportation (Non-Medical): Patient declined  Physical Activity: Unknown (02/02/2024)   Exercise Vital Sign    Days of Exercise per Week: Patient declined    Minutes of Exercise per Session: Not on file  Stress: Patient Declined (02/02/2024)   [employer] of Occupational Health - Occupational Stress Questionnaire    Feeling of Stress: Patient declined  Social Connections: Unknown  (02/02/2024)   Social Connection and Isolation Panel    Frequency of Communication with Friends and Family: Patient declined    Frequency of Social Gatherings with Friends and Family: Patient declined    Attends Religious Services: Patient declined    Active Member of Clubs or Organizations: Patient declined    Attends Banker Meetings: Not on file    Marital Status: Patient declined  Intimate Partner Violence: Not At Risk (02/24/2023)   Received from [facility]   HITS    Over the last 12 months how often did your partner physically hurt you?: Never    Over the last 12 months how often did your partner insult you or talk down to you?: Never    Over the last 12 months how often did your partner threaten you with physical harm?: Never    Over the last 12 months how often did your partner scream or curse at you?: Never   Family History  Problem Relation Age of Onset   Breast cancer Maternal Aunt      Review of Systems  All other systems reviewed and are negative.      Objective:   Physical Exam Vitals reviewed.  Constitutional:      General: She is not in acute distress.    Appearance: Normal appearance. She is obese. She is not ill-appearing, toxic-appearing or diaphoretic.  HENT:     Head: Normocephalic and atraumatic.   Eyes:     General: Lids are normal.     Extraocular Movements: Extraocular movements intact.     Conjunctiva/sclera: Conjunctivae normal.  Neck:     Vascular: No carotid bruit.  Cardiovascular:     Rate and Rhythm: Normal rate and regular rhythm.     Pulses: Normal pulses.     Heart sounds: Normal heart sounds. No murmur heard.    No friction rub. No gallop.  Pulmonary:     Effort: Pulmonary effort is normal. No respiratory distress.     Breath sounds: Normal breath sounds. No stridor. No wheezing, rhonchi or rales.  Chest:     Chest wall: No tenderness.  Musculoskeletal:     Cervical back: Normal range of motion and neck supple. No  rigidity or tenderness.  Lymphadenopathy:     Cervical: No cervical adenopathy.  Skin:    Findings: Rash present. Rash is macular and scaling. Rash is not papular, pustular, urticarial or vesicular.  Neurological:     General: No focal deficit present.     Mental Status: She is alert and oriented to person, place, and time. Mental status is at baseline.     Cranial Nerves: No cranial nerve deficit, dysarthria or facial asymmetry.     Sensory: Sensation is intact. No sensory deficit.     Motor: Motor function is intact. No weakness.     Coordination: Coordination is intact. Coordination normal.     Gait: Gait normal.     Deep Tendon Reflexes: Reflexes normal.         Assessment & Plan:  Acute cough - Plan: VENTOLIN  HFA 108 (90 Base) MCG/ACT inhaler  Polyarthralgia - Plan: Rheumatoid factor, Sedimentation rate, CBC with Differential/Platelet, Comprehensive metabolic panel with GFR, ANA  Colon cancer screening - Plan: Ambulatory referral to Gastroenterology Patient may have seborrheic dermatitis on the forehead.  We will try Nizoral  shampoo twice weekly in addition to the hydrocortisone  cream.  If not improving consider zoryve.  I gave the patient 2.5% hydrocortisone  cream for the external hemorrhoid.  I will consult GI for screening colonoscopy.  Given her polyarthralgias I will check a rheumatoid factor, sed rate, an ANA, CBC and a CMP.  If lab works are normal, I suspect the patient is developing osteoarthritis.

## 2024-02-02 NOTE — Addendum Note (Signed)
 Addended by: ANGELENA RONAL BRADLEY K on: 02/02/2024 12:31 PM   Modules accepted: Orders

## 2024-02-03 LAB — CBC WITH DIFFERENTIAL/PLATELET
Absolute Lymphocytes: 1584 {cells}/uL (ref 850–3900)
Absolute Monocytes: 346 {cells}/uL (ref 200–950)
Basophils Absolute: 29 {cells}/uL (ref 0–200)
Basophils Relative: 0.8 %
Eosinophils Absolute: 40 {cells}/uL (ref 15–500)
Eosinophils Relative: 1.1 %
HCT: 40.2 % (ref 35.0–45.0)
Hemoglobin: 13.4 g/dL (ref 11.7–15.5)
MCH: 31.6 pg (ref 27.0–33.0)
MCHC: 33.3 g/dL (ref 32.0–36.0)
MCV: 94.8 fL (ref 80.0–100.0)
MPV: 9.8 fL (ref 7.5–12.5)
Monocytes Relative: 9.6 %
Neutro Abs: 1602 {cells}/uL (ref 1500–7800)
Neutrophils Relative %: 44.5 %
Platelets: 242 Thousand/uL (ref 140–400)
RBC: 4.24 Million/uL (ref 3.80–5.10)
RDW: 12.5 % (ref 11.0–15.0)
Total Lymphocyte: 44 %
WBC: 3.6 Thousand/uL — ABNORMAL LOW (ref 3.8–10.8)

## 2024-02-03 LAB — COMPREHENSIVE METABOLIC PANEL WITH GFR
AG Ratio: 2.1 (calc) (ref 1.0–2.5)
ALT: 26 U/L (ref 6–29)
AST: 23 U/L (ref 10–35)
Albumin: 4.1 g/dL (ref 3.6–5.1)
Alkaline phosphatase (APISO): 79 U/L (ref 37–153)
BUN: 11 mg/dL (ref 7–25)
CO2: 26 mmol/L (ref 20–32)
Calcium: 9.3 mg/dL (ref 8.6–10.4)
Chloride: 106 mmol/L (ref 98–110)
Creat: 0.76 mg/dL (ref 0.50–1.05)
Globulin: 2 g/dL (ref 1.9–3.7)
Glucose, Bld: 75 mg/dL (ref 65–99)
Potassium: 3.7 mmol/L (ref 3.5–5.3)
Sodium: 140 mmol/L (ref 135–146)
Total Bilirubin: 0.5 mg/dL (ref 0.2–1.2)
Total Protein: 6.1 g/dL (ref 6.1–8.1)
eGFR: 89 mL/min/1.73m2 (ref >=60)

## 2024-02-03 LAB — SEDIMENTATION RATE: Sed Rate: 2 mm/h (ref 0–30)

## 2024-02-03 LAB — RHEUMATOID FACTOR: Rheumatoid fact SerPl-aCnc: <10 [IU]/mL (ref <14)

## 2024-02-03 LAB — ANA: Anti Nuclear Antibody (ANA): NEGATIVE

## 2024-02-05 ENCOUNTER — Ambulatory Visit: Payer: Self-pay | Admitting: Family Medicine

## 2024-02-07 ENCOUNTER — Other Ambulatory Visit: Payer: Self-pay

## 2024-02-07 DIAGNOSIS — M2550 Pain in unspecified joint: Secondary | ICD-10-CM

## 2024-02-07 MED ORDER — MELOXICAM 15 MG PO TABS: 15.0000 mg | ORAL_TABLET | Freq: Every day | ORAL | 1 refills | Status: DC

## 2024-02-13 NOTE — Progress Notes (Unsigned)
 [facility] MD/PA/NP OP Progress Note  02/16/2024 11:24 AM Rhonda [name]  MRN:  [id number]  Visit Diagnosis:    ICD-10-CM   1. GAD (generalized anxiety disorder)  F41.1 desvenlafaxine  (PRISTIQ ) 25 MG 24 hr tablet    desvenlafaxine  (PRISTIQ ) 50 MG 24 hr tablet    2. Depression, major, single episode, moderate (HCC)  F32.1 desvenlafaxine  (PRISTIQ ) 25 MG 24 hr tablet    desvenlafaxine  (PRISTIQ ) 50 MG 24 hr tablet       Assessment: Rhonda [name] is a 63 y.o. female with a history of depression, obstructive sleep apnea who presented to [facility] at [facility] for initial evaluation on 04/15/22.    During initial evaluation patient reported symptoms of anxiety, increased irritability, increased fatigue, decreased concentration, low mood, and increased need to sleep.  Patient reported that her symptoms tend to be worse in the fall and winter.  She denied any suicidal ideation or thoughts of self-harm along with any history of psychosis, mania, paranoia, or delusions.  Of note patient has sleep apnea which is well-controlled with CPAP, and vitamin D  deficiency which is well-controlled.  Patient's symptoms meet criteria for MDD at time of initial evaluation.  Following patient's gastric bypass she has been unable to tolerate medications and was noted to be experiencing increased symptoms of anxiety including excessive worry, restlessness, increased irritability, and feeling overwhelmed.  With the symptoms she had criteria for generalized anxiety disorder.  [name] presents for follow-up evaluation. Today, 02/16/24, patient reports that mood and anxiety have remained stable.  Insomnia is also well controlled.  She has been handling stressors well with coping skills.  Would be appropriate to continue on current regimen.  Patient has some increased difficulty during the fall/winter and light therapy could be restarted if needed during this time.  We will follow up in 3  months.  Plan: - Continue Pristiq  75 mg every day - Continue Vit D 10000 units daily managed by PCP - Patient started light therapy with 10000 Lux light - CMP, CBC, lipid profile, Vit D, TSH, A1c reviewd - Can consider therapy in the future if needed - Follow up in 3 months  Chief Complaint:  Chief Complaint  Patient presents with   Follow-up   HPI: On presentation today Rhonda [name] reports that she is doing pretty good. Enjoyed her cruise and her time in [place]. She did end up getting Covid on the way back, but otherwise enjoyed her trip.  Since being home things have been going fairly well.  There have been some stressors with house maintenance however she is staying upbeat about it.  Mood has been stable and well controlled.  She denies any increase in depressive symptoms.  Similarly anxiety has been well-controlled.  For the past month she has been achy and was found to be polymyalgia rheumatica arthritis. Her PCP started her on meloxicam  which has been helping out. Finley has not been napping since starting it. She has been able to get out and garden.  She is taking her Prystiq consistently and denies any adverse side effects.   Past Psychiatric History: Patient reports that her psychiatric care started in 1991 following her father's suicide.  She was diagnosed with depression and seasonal affective disorder.  Patient was tried on a number of medications including Cymbalta , Paxil , Zoloft, Lexapro, Wellbutrin  XL 300 mg (worked well in the past with Pristiq ), and Pristiq .  She denies trying any mood stabilizers or antipsychotics.  Patient notes that she is prescribed  gabapentin  for pain and it made her feel a bit loopy.  Patient attended therapy after separating for her husband and reports it was helpful at the time.  She denies any SI/HI or thoughts of self-harm or any prior suicide attempts as well as any past psychiatric hospitalizations.  Patient denies any substance use.  Past Medical  History:  Past Medical History:  Diagnosis Date   Arthritis    knees   Carpal tunnel syndrome of left wrist 07/2016   Depression    History of MRSA infection > 10 years   right leg   Hyperlipidemia    Hypertension    states under control with meds., has been on med. > 2 yr.   OSA (obstructive sleep apnea) 09/05/2019    Past Surgical History:  Procedure Laterality Date   ABDOMINAL HYSTERECTOMY     partial   BREAST EXCISIONAL BIOPSY     CARPAL TUNNEL RELEASE Left 08/26/2016   Procedure: LEFT WRIST CARPAL TUNNEL RELEASE;  Surgeon: Toribio JULIANNA Chancy, MD;  Location: [facility];  Service: Orthopedics;  Laterality: Left;   COLONOSCOPY WITH PROPOFOL   03/20/2014   LAPAROSCOPIC UNILATERAL SALPINGECTOMY Right 05/18/2002   OVARY BIOPSY Right 05/18/2002   TRIGGER FINGER RELEASE Left 02/10/2017   Procedure: LEFT TRIGGER THUMB RELEASE (TENDON SHEATH INCISION);  Surgeon: Chancy Toribio JULIANNA, MD;  Location: [facility];  Service: Orthopedics;  Laterality: Left;    Family Psychiatric History: Patient reports that her father had depression and committed suicide in 55.  Family History:  Family History  Problem Relation Age of Onset   Breast cancer Maternal Aunt     Social History:  Social History   Socioeconomic History   Marital status: Single    Spouse name: Not on file   Number of children: Not on file   Years of education: Not on file   Highest education level: Associate degree: academic program  Occupational History   Not on file  Tobacco Use   Smoking status: Never   Smokeless tobacco: Never  Vaping Use   Vaping status: Never Used  Substance and Sexual Activity   Alcohol use: No   Drug use: No   Sexual activity: Not on file  Other Topics Concern   Not on file  Social History Narrative   Left handed   Caffeine- 2 daily   Social Drivers of Health   Financial Resource Strain: Patient Declined (02/02/2024)   Overall Financial Resource Strain  (CARDIA)    Difficulty of Paying Living Expenses: Patient declined  Food Insecurity: Patient Declined (02/02/2024)   Hunger Vital Sign    Worried About Running Out of Food in the Last Year: Patient declined    Ran Out of Food in the Last Year: Patient declined  Transportation Needs: Patient Declined (02/02/2024)   PRAPARE - Administrator, Civil Service (Medical): Patient declined    Lack of Transportation (Non-Medical): Patient declined  Physical Activity: Unknown (02/02/2024)   Exercise Vital Sign    Days of Exercise per Week: Patient declined    Minutes of Exercise per Session: Not on file  Stress: Patient Declined (02/02/2024)   [employer] of Occupational Health - Occupational Stress Questionnaire    Feeling of Stress: Patient declined  Social Connections: Unknown (02/02/2024)   Social Connection and Isolation Panel    Frequency of Communication with Friends and Family: Patient declined    Frequency of Social Gatherings with Friends and Family: Patient declined    Attends  Religious Services: Patient declined    Active Member of Clubs or Organizations: Patient declined    Attends Banker Meetings: Not on file    Marital Status: Patient declined    Allergies:  Allergies  Allergen Reactions   Penicillins Hives         Statins Other (See Comments)    MYALGIAS   Lisinopril Cough    Current Medications: Current Outpatient Medications  Medication Sig Dispense Refill   acetaminophen  (TYLENOL ) 500 MG tablet Take 500 mg by mouth every 6 (six) hours as needed.     azelastine (ASTELIN) 0.1 % nasal spray Place 1 spray into both nostrils 2 (two) times daily.     azithromycin  (ZITHROMAX ) 250 MG tablet 2 tabs poqday1, 1 tab poqday 2-5 (Patient not taking: Reported on 02/02/2024) 6 tablet 0   Cholecalciferol (DIALYVITE  VITAMIN D  5000) 125 MCG (5000 UT) capsule Take 2 capsules (10,000 Units total) by mouth daily.     desvenlafaxine  (PRISTIQ ) 25 MG 24 hr tablet Take  1 tablet (25 mg total) by mouth daily. Take with a 50 mg tab for a total of 75 mg daily 90 tablet 0   desvenlafaxine  (PRISTIQ ) 50 MG 24 hr tablet Take 1 tablet (50 mg total) by mouth daily. Take with a 25 mg tab for a total of 75 mg daily 90 tablet 0   ezetimibe  (ZETIA ) 10 MG tablet TAKE 1 TABLET BY MOUTH EVERY DAY 90 tablet 0   fluticasone (FLONASE) 50 MCG/ACT nasal spray Place into both nostrils as needed.      hydrocortisone  2.5 % cream Apply topically 2 (two) times daily. 30 g 1   ketoconazole  (NIZORAL ) 2 % shampoo Apply 1 Application topically 2 (two) times a week. 120 mL 11   meloxicam  (MOBIC ) 15 MG tablet Take 1 tablet (15 mg total) by mouth daily. 30 tablet 1   mometasone  (ELOCON ) 0.1 % cream Apply topically daily. 15 g 1   montelukast  (SINGULAIR ) 10 MG tablet TAKE 1 TABLET BY MOUTH EVERYDAY AT BEDTIME 90 tablet 3   Multiple Vitamins-Iron  (DAILY VITAMIN/IRON ) TABS Chelate Iron      omeprazole (PRILOSEC) 40 MG capsule Take 40 mg by mouth daily.     promethazine -dextromethorphan (PROMETHAZINE -DM) 6.25-15 MG/5ML syrup Take 5 mLs by mouth 3 (three) times daily as needed for cough. 118 mL 0   rizatriptan  (MAXALT -MLT) 5 MG disintegrating tablet Take 1 tablet (5 mg total) by mouth as needed. May repeat in 2 hours if needed 10 tablet 6   topiramate  (TOPAMAX ) 25 MG tablet Take 2 tablets (50 mg total) by mouth at bedtime. 180 tablet 3   triamcinolone  cream (KENALOG ) 0.1 % Apply 1 Application topically 2 (two) times daily. 30 g 0   valsartan  (DIOVAN ) 160 MG tablet Take 1 tablet (160 mg total) by mouth daily. 30 tablet 5   VENTOLIN  HFA 108 (90 Base) MCG/ACT inhaler Inhale 2 puffs into the lungs every 4 (four) hours as needed for wheezing. 1 each 3   No current facility-administered medications for this visit.     Psychiatric Specialty Exam: Review of Systems  There were no vitals taken for this visit.There is no height or weight on file to calculate BMI.  General Appearance: Fairly Groomed  Eye  Contact:  Good  Speech:  Clear and Coherent and Normal Rate  Volume:  Normal  Mood:  Euthymic  Affect:  Congruent  Thought Process:  Coherent, Goal Directed, and Linear  Orientation:  Full (Time, Place, and Person)  Thought Content: Logical   Suicidal Thoughts:  No  Homicidal Thoughts:  No  Memory:  NA  Judgement:  Good  Insight:  Good  Psychomotor Activity:  Normal  Concentration:  Concentration: Good  Recall:  Good  Fund of Knowledge: Good  Language: Good  Akathisia:  NA    AIMS (if indicated): not done  Assets:  Communication Skills Desire for Improvement Housing Leisure Time Physical Health Resilience Social Support Talents/Skills Transportation Vocational/Educational  ADL's:  Intact  Cognition: WNL  Sleep:  Fair   Metabolic Disorder Labs: Lab Results  Component Value Date   HGBA1C 5.9 (H) 04/08/2022   MPG 123 04/08/2022   MPG 114 08/06/2019   No results found for: PROLACTIN Lab Results  Component Value Date   CHOL 194 02/02/2024   TRIG 55 02/02/2024   HDL 79 02/02/2024   CHOLHDL 2.5 02/02/2024   VLDL 17 08/02/2016   LDLCALC 101 (H) 02/02/2024   LDLCALC 129 (H) 05/07/2022   Lab Results  Component Value Date   TSH 0.45 09/17/2022   TSH 0.82 10/30/2021    Therapeutic Level Labs: No results found for: LITHIUM No results found for: VALPROATE No results found for: CBMZ   Screenings: GAD-7    Flowsheet Row Office Visit from 02/02/2024 in [facility] Office Visit from 03/08/2023 in [facility] Office Visit from 05/25/2022 in [facility] Office Visit from 05/07/2022 in [facility] Office Visit from 10/29/2019 in [facility]  Total GAD-7 Score 0 6 0 0 9   PHQ2-9    Flowsheet Row Office Visit from 02/02/2024 in [facility] Office Visit from 05/30/2023 in [facility] Office Visit from 05/05/2023 in [facility] [place] Office Visit from 03/08/2023 in [facility] Office Visit from 05/25/2022 in [facility]  PHQ-2 Total Score 0 0 0 0 0  PHQ-9 Total Score 3 -- -- -- 0    Collaboration of Care: Collaboration of Care: Medication Management AEB medication prescription and Other provider involved in patient's care AEB PCP chart review  Patient/Guardian was advised Release of Information must be obtained prior to any record release in order to collaborate their care with an outside provider. Patient/Guardian was advised if they have not already done so to contact the registration department to sign all necessary forms in order for us  to release information regarding their care.   Consent: Patient/Guardian gives verbal consent for treatment and assignment of benefits for services provided during this visit. Patient/Guardian expressed understanding and agreed to proceed.    Arvella CHRISTELLA Finder, MD 02/16/2024, 11:24 AM   Virtual Visit via Video Note  I connected with Bascom Saba on 02/16/24 at 11:00 AM EDT by a video enabled telemedicine application and verified that I am speaking with the correct person using two identifiers.  Location: Patient: Home Provider: Home Office   I discussed the limitations of evaluation and management by telemedicine and the availability of in person appointments. The patient expressed understanding and agreed to proceed.   I discussed the assessment and treatment plan with the patient. The patient was provided an opportunity to ask questions and all were answered. The patient agreed with the plan and demonstrated an understanding of the instructions.   The patient was advised to call back or seek an in-person evaluation if  the symptoms worsen or if the condition fails to improve as anticipated.  I provided 20 minutes of non-face-to-face time during  this encounter.   Arvella CHRISTELLA Finder, MD

## 2024-02-16 ENCOUNTER — Telehealth (HOSPITAL_BASED_OUTPATIENT_CLINIC_OR_DEPARTMENT_OTHER): Admitting: Psychiatry

## 2024-02-16 ENCOUNTER — Encounter (HOSPITAL_COMMUNITY): Payer: Self-pay | Admitting: Psychiatry

## 2024-02-16 DIAGNOSIS — F411 Generalized anxiety disorder: Secondary | ICD-10-CM | POA: Diagnosis not present

## 2024-02-16 DIAGNOSIS — F321 Major depressive disorder, single episode, moderate: Secondary | ICD-10-CM

## 2024-02-16 MED ORDER — DESVENLAFAXINE SUCCINATE ER 25 MG PO TB24: 25.0000 mg | ORAL_TABLET | Freq: Every day | ORAL | 0 refills | Status: DC

## 2024-02-16 MED ORDER — DESVENLAFAXINE SUCCINATE ER 50 MG PO TB24: 50.0000 mg | ORAL_TABLET | Freq: Every day | ORAL | 0 refills | Status: DC

## 2024-03-28 ENCOUNTER — Encounter: Payer: Self-pay | Admitting: Physician Assistant

## 2024-05-02 ENCOUNTER — Other Ambulatory Visit: Payer: Self-pay | Admitting: Family Medicine

## 2024-05-02 DIAGNOSIS — M2550 Pain in unspecified joint: Secondary | ICD-10-CM

## 2024-05-07 ENCOUNTER — Other Ambulatory Visit: Payer: Self-pay

## 2024-05-07 ENCOUNTER — Telehealth: Payer: Self-pay

## 2024-05-07 MED ORDER — EZETIMIBE 10 MG PO TABS
10.0000 mg | ORAL_TABLET | Freq: Every day | ORAL | 0 refills | Status: AC
Start: 2024-05-07 — End: ?

## 2024-05-07 NOTE — Telephone Encounter (Signed)
 Sent in medication

## 2024-05-07 NOTE — Telephone Encounter (Signed)
 Prescription Request  05/07/2024  LOV: 02/02/24  What is the name of the medication or equipment? ezetimibe  (ZETIA ) 10 MG tablet [[id number]]   Have you contacted your pharmacy to request a refill? Yes   Which pharmacy would you like this sent to?  [id number] - [place], [place] - [address] AT [facility] [address] [place] [address] Phone: [phone] Fax: [phone]    Patient notified that their request is being sent to the clinical staff for review and that they should receive a response within 2 business days.   Please advise at [facility] [phone]

## 2024-05-14 NOTE — Progress Notes (Unsigned)
 BH MD/PA/NP OP Progress Note  05/17/2024 11:44 AM Rhonda [name]  MRN:  [id number]  Visit Diagnosis:    ICD-10-CM   1. Seasonal affective disorder  F33.8     2. GAD (generalized anxiety disorder)  F41.1 desvenlafaxine  (PRISTIQ ) 100 MG 24 hr tablet    3. Depression, major, single episode, moderate (HCC)  F32.1 desvenlafaxine  (PRISTIQ ) 100 MG 24 hr tablet      Assessment: Rhonda [name] is a 64 y.o. female with a history of depression, obstructive sleep apnea who presented to [facility] at [facility] for initial evaluation on 04/15/22.    During initial evaluation patient reported symptoms of anxiety, increased irritability, increased fatigue, decreased concentration, low mood, and increased need to sleep.  Patient reported that her symptoms tend to be worse in the fall and winter.  She denied any suicidal ideation or thoughts of self-harm along with any history of psychosis, mania, paranoia, or delusions.  Of note patient has sleep apnea which is well-controlled with CPAP, and vitamin D  deficiency which is well-controlled.  Patient's symptoms met criteria for MDD at time of initial evaluation.  Following patient's gastric bypass she has been unable to tolerate medications and was noted to be experiencing increased symptoms of anxiety including excessive worry, restlessness, increased irritability, and feeling overwhelmed.  With the symptoms she had criteria for generalized anxiety disorder.  Rhonda [name] presents for follow-up evaluation. Today, 05/17/24, patient has experienced an increase in irritability over the last couple months.  She does have a seasonal pattern of increased depression and irritability with similar occurrence last year.  We will titrate Pristiq  to 100 mg daily and reviewed risk and benefits.  Will plan to taper back to 75 mg in the spring.  Patient will also restart her light therapy and was reminded to take 30 minutes every morning doing  this.  We will follow-up in 3 months.  Plan: - Increase Pristiq  to 100 mg every day, plan to taper back to 75 mg in the spring - Continue Vit D 10000 units daily managed by PCP - Patient encouraged to restart light therapy with 10000 Lux light - CMP, CBC, lipid profile, Vit D, TSH, A1c reviewd - Can consider therapy in the future if needed - Follow up in 3 months  Chief Complaint:  Chief Complaint  Patient presents with   Follow-up   HPI: On presentation today Rhonda [name] reports that she has been more irritable lately.  For all just being in a more irritable mood.  Rhonda [name] has experienced similar symptoms during the fall and winter in the past.  Last year she had used light therapy with good effect getting through it and stopped come spring.  This year she had not restarted the light therapy yet and irritability symptoms have gotten a bit worse.  Most recent stressor was going on a trip with 17 people.  While the trip overall was good this was related to her trying to make everything perfect.  Due to this she did not spend time on herself or get enough rest however.  This left her feeling more irritable during trip and after.  Reminded patient that is important to take time for herself every day.  While helping others can be rewarding, we have to know our own limits.  If we are physically or mentally burnt out then were not can I be able to provide effective care for those around this.  Given current symptoms and increase in irritability compared to  other years we discussed a temporary titration of Pristiq  to 100 mg.  Furthermore patient will resume light therapy and take 30 minutes each morning focusing on self-care.  Past Psychiatric History: Patient reports that her psychiatric care started in 1991 following her father's suicide.  She was diagnosed with depression and seasonal affective disorder.  Patient was tried on a number of medications including Cymbalta , Paxil , Zoloft, Lexapro, Wellbutrin  XL  300 mg (worked well in the past with Pristiq ), and Pristiq .  She denies trying any mood stabilizers or antipsychotics.  Patient notes that she is prescribed gabapentin  for pain and it made her feel a bit loopy.  Patient attended therapy after separating for her husband and reports it was helpful at the time.  She denies any SI/HI or thoughts of self-harm or any prior suicide attempts as well as any past psychiatric hospitalizations.  Patient denies any substance use.  Past Medical History:  Past Medical History:  Diagnosis Date   Arthritis    knees   Carpal tunnel syndrome of left wrist 07/2016   Depression    History of MRSA infection > 10 years   right leg   Hyperlipidemia    Hypertension    states under control with meds., has been on med. > 2 yr.   OSA (obstructive sleep apnea) 09/05/2019    Past Surgical History:  Procedure Laterality Date   ABDOMINAL HYSTERECTOMY     partial   BREAST EXCISIONAL BIOPSY     CARPAL TUNNEL RELEASE Left 08/26/2016   Procedure: LEFT WRIST CARPAL TUNNEL RELEASE;  Surgeon: Toribio JULIANNA Chancy, MD;  Location: [facility];  Service: Orthopedics;  Laterality: Left;   COLONOSCOPY WITH PROPOFOL   03/20/2014   LAPAROSCOPIC UNILATERAL SALPINGECTOMY Right 05/18/2002   OVARY BIOPSY Right 05/18/2002   TRIGGER FINGER RELEASE Left 02/10/2017   Procedure: LEFT TRIGGER THUMB RELEASE (TENDON SHEATH INCISION);  Surgeon: Chancy Toribio JULIANNA, MD;  Location: [facility];  Service: Orthopedics;  Laterality: Left;    Family Psychiatric History: Patient reports that her father had depression and committed suicide in 39.  Family History:  Family History  Problem Relation Age of Onset   Breast cancer Maternal Aunt     Social History:  Social History   Socioeconomic History   Marital status: Single    Spouse name: Not on file   Number of children: Not on file   Years of education: Not on file   Highest education level: Associate degree:  academic program  Occupational History   Not on file  Tobacco Use   Smoking status: Never   Smokeless tobacco: Never  Vaping Use   Vaping status: Never Used  Substance and Sexual Activity   Alcohol use: No   Drug use: No   Sexual activity: Not on file  Other Topics Concern   Not on file  Social History Narrative   Left handed   Caffeine- 2 daily   Social Drivers of Health   Financial Resource Strain: Patient Declined (02/02/2024)   Overall Financial Resource Strain (CARDIA)    Difficulty of Paying Living Expenses: Patient declined  Food Insecurity: Patient Declined (02/02/2024)   Hunger Vital Sign    Worried About Running Out of Food in the Last Year: Patient declined    Ran Out of Food in the Last Year: Patient declined  Transportation Needs: Patient Declined (02/02/2024)   PRAPARE - Administrator, Civil Service (Medical): Patient declined    Lack of Transportation (Non-Medical):  Patient declined  Physical Activity: Unknown (02/02/2024)   Exercise Vital Sign    Days of Exercise per Week: Patient declined    Minutes of Exercise per Session: Not on file  Stress: Patient Declined (02/02/2024)   [employer] of Occupational Health - Occupational Stress Questionnaire    Feeling of Stress: Patient declined  Social Connections: Unknown (02/02/2024)   Social Connection and Isolation Panel    Frequency of Communication with Friends and Family: Patient declined    Frequency of Social Gatherings with Friends and Family: Patient declined    Attends Religious Services: Patient declined    Database Administrator or Organizations: Patient declined    Attends Banker Meetings: Not on file    Marital Status: Patient declined    Allergies:  Allergies  Allergen Reactions   Penicillins Hives         Statins Other (See Comments)    MYALGIAS   Lisinopril Cough    Current Medications: Current Outpatient Medications  Medication Sig Dispense Refill    acetaminophen  (TYLENOL ) 500 MG tablet Take 500 mg by mouth every 6 (six) hours as needed.     azelastine (ASTELIN) 0.1 % nasal spray Place 1 spray into both nostrils 2 (two) times daily.     azithromycin  (ZITHROMAX ) 250 MG tablet 2 tabs poqday1, 1 tab poqday 2-5 (Patient not taking: Reported on 02/02/2024) 6 tablet 0   Cholecalciferol (DIALYVITE  VITAMIN D  5000) 125 MCG (5000 UT) capsule Take 2 capsules (10,000 Units total) by mouth daily.     desvenlafaxine  (PRISTIQ ) 100 MG 24 hr tablet Take 1 tablet (100 mg total) by mouth daily. Take with a 25 mg tab for a total of 75 mg daily 90 tablet 0   ezetimibe  (ZETIA ) 10 MG tablet Take 1 tablet (10 mg total) by mouth daily. 90 tablet 0   fluticasone (FLONASE) 50 MCG/ACT nasal spray Place into both nostrils as needed.      hydrocortisone  2.5 % cream Apply topically 2 (two) times daily. 30 g 1   ketoconazole  (NIZORAL ) 2 % shampoo Apply 1 Application topically 2 (two) times a week. 120 mL 11   meloxicam  (MOBIC ) 15 MG tablet TAKE 1 TABLET (15 MG TOTAL) BY MOUTH DAILY. 30 tablet 1   mometasone  (ELOCON ) 0.1 % cream Apply topically daily. 15 g 1   montelukast  (SINGULAIR ) 10 MG tablet TAKE 1 TABLET BY MOUTH EVERYDAY AT BEDTIME 90 tablet 3   Multiple Vitamins-Iron  (DAILY VITAMIN/IRON ) TABS Chelate Iron      omeprazole (PRILOSEC) 40 MG capsule Take 40 mg by mouth daily.     promethazine -dextromethorphan (PROMETHAZINE -DM) 6.25-15 MG/5ML syrup Take 5 mLs by mouth 3 (three) times daily as needed for cough. 118 mL 0   rizatriptan  (MAXALT -MLT) 5 MG disintegrating tablet Take 1 tablet (5 mg total) by mouth as needed. May repeat in 2 hours if needed 10 tablet 6   topiramate  (TOPAMAX ) 25 MG tablet Take 2 tablets (50 mg total) by mouth at bedtime. 180 tablet 3   triamcinolone  cream (KENALOG ) 0.1 % Apply 1 Application topically 2 (two) times daily. 30 g 0   valsartan  (DIOVAN ) 160 MG tablet Take 1 tablet (160 mg total) by mouth daily. 30 tablet 5   VENTOLIN  HFA 108 (90 Base)  MCG/ACT inhaler Inhale 2 puffs into the lungs every 4 (four) hours as needed for wheezing. 1 each 3   No current facility-administered medications for this visit.     Psychiatric Specialty Exam: Review of Systems  There  were no vitals taken for this visit.There is no height or weight on file to calculate BMI.  General Appearance: Fairly Groomed  Eye Contact:  Good  Speech:  Clear and Coherent and Normal Rate  Volume:  Normal  Mood:  Euthymic and Irritable  Affect:  Congruent  Thought Process:  Coherent, Goal Directed, and Linear  Orientation:  Full (Time, Place, and Person)  Thought Content: Logical   Suicidal Thoughts:  No  Homicidal Thoughts:  No  Memory:  NA  Judgement:  Good  Insight:  Good  Psychomotor Activity:  Normal  Concentration:  Concentration: Good  Recall:  Good  Fund of Knowledge: Good  Language: Good  Akathisia:  NA    AIMS (if indicated): not done  Assets:  Communication Skills Desire for Improvement Housing Leisure Time Physical Health Resilience Social Support Talents/Skills Transportation Vocational/Educational  ADL's:  Intact  Cognition: WNL  Sleep:  Fair   Metabolic Disorder Labs: Lab Results  Component Value Date   HGBA1C 5.9 (H) 04/08/2022   MPG 123 04/08/2022   MPG 114 08/06/2019   No results found for: PROLACTIN Lab Results  Component Value Date   CHOL 194 02/02/2024   TRIG 55 02/02/2024   HDL 79 02/02/2024   CHOLHDL 2.5 02/02/2024   VLDL 17 08/02/2016   LDLCALC 101 (H) 02/02/2024   LDLCALC 129 (H) 05/07/2022   Lab Results  Component Value Date   TSH 0.45 09/17/2022   TSH 0.82 10/30/2021    Therapeutic Level Labs: No results found for: LITHIUM No results found for: VALPROATE No results found for: CBMZ   Screenings: GAD-7    Flowsheet Row Office Visit from 02/02/2024 in [facility] Office Visit from 03/08/2023 in [facility] Office Visit from 05/25/2022  in [facility] Office Visit from 05/07/2022 in [facility] Office Visit from 10/29/2019 in [facility]  Total GAD-7 Score 0 6 0 0 9   PHQ2-9    Flowsheet Row Office Visit from 02/02/2024 in [facility] Office Visit from 05/30/2023 in [facility] Office Visit from 05/05/2023 in [facility] Office Visit from 03/08/2023 in [facility] Office Visit from 05/25/2022 in [facility]  PHQ-2 Total Score 0 0 0 0 0  PHQ-9 Total Score 3 -- -- -- 0    Collaboration of Care: Collaboration of Care: Medication Management AEB medication prescription and Other provider involved in patient's care AEB PCP chart review  Patient/Guardian was advised Release of Information must be obtained prior to any record release in order to collaborate their care with an outside provider. Patient/Guardian was advised if they have not already done so to contact the registration department to sign all necessary forms in order for us  to release information regarding their care.   Consent: Patient/Guardian gives verbal consent for treatment and assignment of benefits for services provided during this visit. Patient/Guardian expressed understanding and agreed to proceed.    Arvella CHRISTELLA Finder, MD 05/17/2024, 11:44 AM   Virtual Visit via Video Note  I connected with Rhonda [name] on 05/17/24 at 11:30 AM EST by a video enabled telemedicine application and verified that I am speaking with the correct person using two identifiers.  Location: Patient: Home Provider: Home Office   I discussed the limitations of evaluation and management by telemedicine  and the availability of in person appointments. The patient expressed understanding and agreed to proceed.   I discussed the assessment and treatment plan with the patient. The  patient was provided an opportunity to ask questions and all were answered. The patient agreed with the plan and demonstrated an understanding of the instructions.   The patient was advised to call back or seek an in-person evaluation if the symptoms worsen or if the condition fails to improve as anticipated.  I provided 20 minutes of non-face-to-face time during this encounter.   Arvella CHRISTELLA Finder, MD

## 2024-05-17 ENCOUNTER — Telehealth (HOSPITAL_BASED_OUTPATIENT_CLINIC_OR_DEPARTMENT_OTHER): Admitting: Psychiatry

## 2024-05-17 ENCOUNTER — Encounter (HOSPITAL_COMMUNITY): Payer: Self-pay | Admitting: Psychiatry

## 2024-05-17 DIAGNOSIS — F321 Major depressive disorder, single episode, moderate: Secondary | ICD-10-CM

## 2024-05-17 DIAGNOSIS — F338 Other recurrent depressive disorders: Secondary | ICD-10-CM | POA: Diagnosis not present

## 2024-05-17 DIAGNOSIS — F411 Generalized anxiety disorder: Secondary | ICD-10-CM

## 2024-05-17 MED ORDER — DESVENLAFAXINE SUCCINATE ER 100 MG PO TB24: 100.0000 mg | ORAL_TABLET | Freq: Every day | ORAL | 0 refills | Status: AC

## 2024-05-20 NOTE — Progress Notes (Unsigned)
 Ellouise Console, PA-C [address] [place], [place]  [address] Phone: [phone]   Gastroenterology Consultation  Referring Provider:     Duanne Butler DASEN, MD Primary Care Physician:  Duanne Butler DASEN, MD Primary Gastroenterologist:  Ellouise Console, PA-C / Norleen Kiang, MD  Reason for Consultation:     Constipation, discuss repeat screening colonoscopy        HPI:   Discussed the use of AI scribe software for clinical note transcription with the patient, who gave verbal consent to proceed.  02/2014 last colonoscopy by Dr. Kiang: Normal.  10-year repeat. History of Present Illness Rhonda [name] is a 63 year old female who presents for a repeat screening colonoscopy.  She has a history of constipation, which she attributes to dehydration. After increasing her water intake, she experienced a change to loose stools, occurring three to five times a day. Increasing water intake has helped alleviate the constipation.  She has hemorrhoids, which are noticeable but not bothersome. She maintains good hygiene and reports no history of blood in the stool or abdominal pain.  She underwent gastric bypass surgery nearly two years ago and has maintained her weight loss. She experiences nausea and vomiting only when eating too fast, which she manages by chewing thoroughly. She does not take any GLP-1 medications for weight loss or diabetes.  She experiences heartburn related to acid reflux, particularly when eating too fast. She manages this with over-the-counter Prilosec (20 mg as needed), Tums, and lifestyle modifications such as chewing gum and using ginger. She prefers to minimize medication use.  No family history of colon cancer and she is not on any blood thinners.  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  Admits to stress as a caregiver.  PMH: Chronic constipation, GERD, history of gastric  bypass, OA, sleep apnea, hypertension, migraines.  Past Medical History:  Diagnosis Date   Anxiety    Arthritis    knees   Carpal tunnel syndrome of left wrist 07/2016   Chronic headache    Depression    History of colon polyps    History of MRSA infection > 10 years   right leg   Hyperlipidemia    Hypertension    states under control with meds., has been on med. > 2 yr.   OSA (obstructive sleep apnea) 09/05/2019    Past Surgical History:  Procedure Laterality Date   ABDOMINAL HYSTERECTOMY     partial   BREAST EXCISIONAL BIOPSY     CARPAL TUNNEL RELEASE Left 08/26/2016   Procedure: LEFT WRIST CARPAL TUNNEL RELEASE;  Surgeon: Toribio JULIANNA Chancy, MD;  Location: [facility];  Service: Orthopedics;  Laterality: Left;   COLONOSCOPY WITH PROPOFOL   03/20/2014   LAPAROSCOPIC UNILATERAL SALPINGECTOMY Right 05/18/2002   OVARY BIOPSY Right 05/18/2002   TRIGGER FINGER RELEASE Left 02/10/2017   Procedure: LEFT TRIGGER THUMB RELEASE (TENDON SHEATH INCISION);  Surgeon: Chancy Toribio JULIANNA, MD;  Location: [facility];  Service: Orthopedics;  Laterality: Left;    Prior to Admission medications   Medication Sig Start Date End Date Taking? Authorizing Provider  acetaminophen  (TYLENOL ) 500 MG tablet Take 500 mg by mouth every 6 (six) hours as needed.    [provider]  azelastine (ASTELIN) 0.1 % nasal spray Place 1 spray into both nostrils 2 (two) times daily. 10/02/23   [provider]  azithromycin  (ZITHROMAX )  250 MG tablet 2 tabs poqday1, 1 tab poqday 2-5 Patient not taking: Reported on 02/02/2024 05/30/23   Duanne Butler DASEN, MD  Cholecalciferol (DIALYVITE  VITAMIN D  5000) 125 MCG (5000 UT) capsule Take 2 capsules (10,000 Units total) by mouth daily. 04/14/20   Bari Theodoro FALCON, MD  desvenlafaxine  (PRISTIQ ) 100 MG 24 hr tablet Take 1 tablet (100 mg total) by mouth daily. Take with a 25 mg tab for a total of 75 mg daily 05/17/24   Carvin Arvella HERO, MD   ezetimibe  (ZETIA ) 10 MG tablet Take 1 tablet (10 mg total) by mouth daily. 05/07/24   Duanne Butler DASEN, MD  fluticasone (FLONASE) 50 MCG/ACT nasal spray Place into both nostrils as needed.     [provider]  hydrocortisone  2.5 % cream Apply topically 2 (two) times daily. 02/02/24   Duanne Butler DASEN, MD  ketoconazole  (NIZORAL ) 2 % shampoo Apply 1 Application topically 2 (two) times a week. 02/02/24   Duanne Butler DASEN, MD  meloxicam  (MOBIC ) 15 MG tablet TAKE 1 TABLET (15 MG TOTAL) BY MOUTH DAILY. 05/03/24   Duanne Butler DASEN, MD  mometasone  (ELOCON ) 0.1 % cream Apply topically daily. 05/14/21   Duanne Butler DASEN, MD  montelukast  (SINGULAIR ) 10 MG tablet TAKE 1 TABLET BY MOUTH EVERYDAY AT BEDTIME 05/28/22   Duanne Butler DASEN, MD  Multiple Vitamins-Iron  (DAILY VITAMIN/IRON ) TABS Chelate Iron  04/14/20   Reserve, Theodoro FALCON, MD  omeprazole (PRILOSEC) 40 MG capsule Take 40 mg by mouth daily.    [provider]  promethazine -dextromethorphan (PROMETHAZINE -DM) 6.25-15 MG/5ML syrup Take 5 mLs by mouth 3 (three) times daily as needed for cough. 11/24/23   Moishe Chiquita HERO, NP  rizatriptan  (MAXALT -MLT) 5 MG disintegrating tablet Take 1 tablet (5 mg total) by mouth as needed. May repeat in 2 hours if needed 10/03/23   Lomax, Amy, NP  topiramate  (TOPAMAX ) 25 MG tablet Take 2 tablets (50 mg total) by mouth at bedtime. 10/03/23   Lomax, Amy, NP  triamcinolone  cream (KENALOG ) 0.1 % Apply 1 Application topically 2 (two) times daily. 08/01/23   Duanne Butler DASEN, MD  valsartan  (DIOVAN ) 160 MG tablet Take 1 tablet (160 mg total) by mouth daily. 09/17/22   Duanne Butler DASEN, MD  VENTOLIN  HFA 108 (90 Base) MCG/ACT inhaler Inhale 2 puffs into the lungs every 4 (four) hours as needed for wheezing. 02/02/24   Duanne Butler DASEN, MD    Family History  Problem Relation Age of Onset   Breast cancer Maternal Aunt    Colon cancer Neg Hx    Esophageal cancer Neg Hx      Social History   Tobacco Use   Smoking  status: Never   Smokeless tobacco: Never  Vaping Use   Vaping status: Never Used  Substance Use Topics   Alcohol use: No   Drug use: No    Allergies as of 05/21/2024 - Review Complete 05/21/2024  Allergen Reaction Noted   Penicillins Hives 09/22/2012   Statins Other (See Comments) 08/02/2014   Lisinopril Cough 09/22/2012    Review of Systems:    All systems reviewed and negative except where noted in HPI.   Physical Exam:  BP 118/86   Pulse 77   Ht 5' 4 (1.626 m)   Wt 137 lb 6 oz (62.3 kg)   BMI 23.58 kg/m  No LMP recorded. Patient has had a hysterectomy.  General:   Alert,  Well-developed, well-nourished, pleasant and cooperative in NAD Lungs:  Respirations even and unlabored.  Clear throughout to auscultation.   No wheezes, crackles, or rhonchi. No acute distress. Heart:  Regular rate and rhythm; no murmurs, clicks, rubs, or gallops. Abdomen:  Normal bowel sounds.  No bruits.  Soft, and non-distended without masses, hepatosplenomegaly or hernias noted.  No Tenderness.  No guarding or rebound tenderness.    Neurologic:  Alert and oriented x3;  grossly normal neurologically. Psych:  Alert and cooperative. Normal mood and affect.   Imaging Studies: No results found.  Labs: CBC    Component Value Date/Time   WBC 3.6 (L) 02/02/2024 1224   RBC 4.24 02/02/2024 1224   HGB 13.4 02/02/2024 1224   HCT 40.2 02/02/2024 1224   PLT 242 02/02/2024 1224   MCV 94.8 02/02/2024 1224    CMP     Component Value Date/Time   NA 140 02/02/2024 1224   K 3.7 02/02/2024 1224   CL 106 02/02/2024 1224   CO2 26 02/02/2024 1224   GLUCOSE 75 02/02/2024 1224   BUN 11 02/02/2024 1224   CREATININE 0.76 02/02/2024 1224   CALCIUM 9.3 02/02/2024 1224   PROT 6.1 02/02/2024 1224   ALBUMIN 4.2 08/02/2016 1109   AST 23 02/02/2024 1224   ALT 26 02/02/2024 1224   ALKPHOS 59 08/02/2016 1109   BILITOT 0.5 02/02/2024 1224   GFRNONAA 61 04/07/2020 1006   GFRAA 71 04/07/2020 1006     Assessment and Plan:   Rhonda [name] is a 63 y.o. y/o female has been referred for: Assessment & Plan 1.  Colorectal cancer screening Due for repeat screening colonoscopy after ten years. - Scheduled colonoscopy with Dr. Abran using Suprep. - Scheduling Colonoscopy I discussed risks of colonoscopy with patient to include risk of bleeding, colon perforation, and risk of sedation.  Patient expressed understanding and agrees to proceed with colonoscopy.   2.  Gastroesophageal reflux disease (GERD) GERD symptoms managed with Prilosec and lifestyle modifications. - Continue OTC Prilosec 20 mg as needed. - Recommend Lifestyle Modifications to prevent Acid Reflux.  Rec. Avoid coffee, sodas, peppermint, garlic, onions, alcohol, citrus fruits, chocolate, tomatoes, fatty and spicey foods.  Avoid eating 2-3 hours before bedtime.    3.  Status post bariatric surgery Successful weight maintenance post-surgery with adherence to dietary recommendations. - Continue annual follow-up appointments with her bariatric specialist.    Follow up as needed based on colonoscopy results.  Ellouise Console, PA-C

## 2024-05-21 ENCOUNTER — Ambulatory Visit: Admitting: Physician Assistant

## 2024-05-21 ENCOUNTER — Encounter: Payer: Self-pay | Admitting: Physician Assistant

## 2024-05-21 VITALS — BP 118/86 | HR 77 | Ht 64.0 in | Wt 137.4 lb

## 2024-05-21 DIAGNOSIS — Z1211 Encounter for screening for malignant neoplasm of colon: Secondary | ICD-10-CM | POA: Diagnosis not present

## 2024-05-21 MED ORDER — NA SULFATE-K SULFATE-MG SULF 17.5-3.13-1.6 GM/177ML PO SOLN: 1.0000 | Freq: Once | ORAL | 0 refills | Status: AC

## 2024-05-21 NOTE — Patient Instructions (Addendum)
 _______________________________________________________  If your blood pressure at your visit was 140/90 or greater, please contact your primary care physician to follow up on this.  _______________________________________________________  If you are age 61 or older, your body mass index should be between 23-30. Your Body mass index is 23.58 kg/m. If this is out of the aforementioned range listed, please consider follow up with your Primary Care Provider.  If you are age 31 or younger, your body mass index should be between 19-25. Your Body mass index is 23.58 kg/m. If this is out of the aformentioned range listed, please consider follow up with your Primary Care Provider.   ________________________________________________________  The [facility] providers would like to encourage you to use MYCHART to communicate with providers for non-urgent requests or questions.  Due to long hold times on the telephone, sending your provider a message by [facility] may be a faster and more efficient way to get a response.  Please allow 48 business hours for a response.  Please remember that this is for non-urgent requests.  _______________________________________________________  [facility] is using a team-based approach to care.  Your team is made up of your doctor and two to three APPS. Our APPS (Nurse Practitioners and Physician Assistants) work with your physician to ensure care continuity for you. They are fully qualified to address your health concerns and develop a treatment plan. They communicate directly with your gastroenterologist to care for you. Seeing the Advanced Practice Practitioners on your physician's team can help you by facilitating care more promptly, often allowing for earlier appointments, access to diagnostic testing, procedures, and other specialty referrals.   We have sent the following medications to your pharmacy for you to pick up at your convenience: Suprep  You have  been scheduled for a colonoscopy. Please follow written instructions given to you at your visit today.   If you use inhalers (even only as needed), please bring them with you on the day of your procedure.  DO NOT TAKE 7 DAYS PRIOR TO TEST- Trulicity (dulaglutide) Ozempic, Wegovy  (semaglutide ) Mounjaro, Zepbound (tirzepatide) Bydureon Bcise (exanatide extended release)  DO NOT TAKE 1 DAY PRIOR TO YOUR TEST Rybelsus (semaglutide ) Adlyxin (lixisenatide) Victoza  (liraglutide ) Byetta (exanatide) ___________________________________________________________________________   It was a pleasure to see you today!  Thank you for trusting me with your gastrointestinal care!

## 2024-05-22 NOTE — Progress Notes (Signed)
 Noted

## 2024-06-04 ENCOUNTER — Encounter: Payer: Self-pay | Admitting: Family Medicine

## 2024-06-04 ENCOUNTER — Ambulatory Visit: Admitting: Family Medicine

## 2024-06-04 VITALS — BP 136/82 | HR 70 | Temp 97.8°F | Ht 64.0 in | Wt 132.0 lb

## 2024-06-04 DIAGNOSIS — Z1231 Encounter for screening mammogram for malignant neoplasm of breast: Secondary | ICD-10-CM

## 2024-06-04 DIAGNOSIS — R5383 Other fatigue: Secondary | ICD-10-CM

## 2024-06-04 DIAGNOSIS — I10 Essential (primary) hypertension: Secondary | ICD-10-CM

## 2024-06-04 DIAGNOSIS — Z0000 Encounter for general adult medical examination without abnormal findings: Secondary | ICD-10-CM | POA: Diagnosis not present

## 2024-06-04 DIAGNOSIS — Z23 Encounter for immunization: Secondary | ICD-10-CM | POA: Diagnosis not present

## 2024-06-04 DIAGNOSIS — Z780 Asymptomatic menopausal state: Secondary | ICD-10-CM

## 2024-06-04 LAB — CBC WITH DIFFERENTIAL/PLATELET
Absolute Lymphocytes: 2000 {cells}/uL (ref 850–3900)
Absolute Monocytes: 374 {cells}/uL (ref 200–950)
Basophils Absolute: 30 {cells}/uL (ref 0–200)
Basophils Relative: 0.7 %
Eosinophils Absolute: 60 {cells}/uL (ref 15–500)
Eosinophils Relative: 1.4 %
HCT: 39.2 % (ref 35.9–46.0)
Hemoglobin: 13.3 g/dL (ref 11.7–15.5)
MCH: 31.7 pg (ref 27.0–33.0)
MCHC: 33.9 g/dL (ref 31.6–35.4)
MCV: 93.6 fL (ref 81.4–101.7)
MPV: 9.8 fL (ref 7.5–12.5)
Monocytes Relative: 8.7 %
Neutro Abs: 1836 {cells}/uL (ref 1500–7800)
Neutrophils Relative %: 42.7 %
Platelets: 241 Thousand/uL (ref 140–400)
RBC: 4.19 Million/uL (ref 3.80–5.10)
RDW: 12.9 % (ref 11.0–15.0)
Total Lymphocyte: 46.5 %
WBC: 4.3 Thousand/uL (ref 3.8–10.8)

## 2024-06-04 LAB — COMPREHENSIVE METABOLIC PANEL WITH GFR
AG Ratio: 2.9 (calc) — ABNORMAL HIGH (ref 1.0–2.5)
ALT: 17 U/L (ref 6–29)
AST: 20 U/L (ref 10–35)
Albumin: 4.4 g/dL (ref 3.6–5.1)
Alkaline phosphatase (APISO): 83 U/L (ref 37–153)
BUN: 11 mg/dL (ref 7–25)
CO2: 26 mmol/L (ref 20–32)
Calcium: 9.2 mg/dL (ref 8.6–10.4)
Chloride: 106 mmol/L (ref 98–110)
Creat: 0.79 mg/dL (ref 0.50–1.05)
Globulin: 1.5 g/dL — ABNORMAL LOW (ref 1.9–3.7)
Glucose, Bld: 89 mg/dL (ref 65–99)
Potassium: 3.7 mmol/L (ref 3.5–5.3)
Sodium: 141 mmol/L (ref 135–146)
Total Bilirubin: 0.5 mg/dL (ref 0.2–1.2)
Total Protein: 5.9 g/dL — ABNORMAL LOW (ref 6.1–8.1)
eGFR: 85 mL/min/1.73m2 (ref >=60)

## 2024-06-04 LAB — LIPID PANEL
Cholesterol: 199 mg/dL (ref <200)
HDL: 82 mg/dL (ref >=50)
LDL Cholesterol (Calc): 103 mg/dL — ABNORMAL HIGH
Non-HDL Cholesterol (Calc): 117 mg/dL (ref <130)
Total CHOL/HDL Ratio: 2.4 (calc) (ref <5.0)
Triglycerides: 49 mg/dL (ref <150)

## 2024-06-04 LAB — TSH: TSH: 0.6 m[IU]/L (ref 0.40–4.50)

## 2024-06-04 MED ORDER — VALSARTAN 80 MG PO TABS: 80.0000 mg | ORAL_TABLET | Freq: Every day | ORAL | 3 refills | Status: AC

## 2024-06-04 NOTE — Addendum Note (Signed)
 Addended by: ANGELENA RONAL BRADLEY K on: 06/04/2024 12:14 PM   Modules accepted: Orders

## 2024-06-04 NOTE — Progress Notes (Signed)
 Subjective:    Patient ID: Rhonda [name], female    DOB: May 18, 1961, 63 y.o.   MRN: [id number]  HPI Patient is a 63 year old female here today for complete physical exam.  She is due for her flu shot.  She is also due for Prevnar 20.  She would like to get the flu shot today but declines Prevnar 20.  Her colonoscopy is due.  She is already scheduled this and it is pending for January.  She is overdue for a mammogram.  She would like me to schedule this for her.  She has never had a bone density test.  She reports that she may have broken ribs several times throughout her life.  Her mother sustained a hip fracture.  Therefore she would like to be screened for osteoporosis.  The patient has a history of a hysterectomy and therefore does not require Pap smear.  Her blood pressure today is acceptable.  She states that is running better than this at home.  2 concerns that she has includes that she is cold all the time and tired. Past Medical History:  Diagnosis Date   Anxiety    Arthritis    knees   Carpal tunnel syndrome of left wrist 07/2016   Chronic headache    Depression    History of colon polyps    History of MRSA infection > 10 years   right leg   Hyperlipidemia    Hypertension    states under control with meds., has been on med. > 2 yr.   OSA (obstructive sleep apnea) 09/05/2019   Past Surgical History:  Procedure Laterality Date   ABDOMINAL HYSTERECTOMY     partial   BREAST EXCISIONAL BIOPSY     CARPAL TUNNEL RELEASE Left 08/26/2016   Procedure: LEFT WRIST CARPAL TUNNEL RELEASE;  Surgeon: Toribio JULIANNA Chancy, MD;  Location: [facility];  Service: Orthopedics;  Laterality: Left;   COLONOSCOPY WITH PROPOFOL   03/20/2014   LAPAROSCOPIC UNILATERAL SALPINGECTOMY Right 05/18/2002   OVARY BIOPSY Right 05/18/2002   TRIGGER FINGER RELEASE Left 02/10/2017   Procedure: LEFT TRIGGER THUMB RELEASE (TENDON SHEATH INCISION);  Surgeon: Chancy Toribio JULIANNA, MD;  Location: [facility];  Service: Orthopedics;  Laterality: Left;   Current Outpatient Medications on File Prior to Visit  Medication Sig Dispense Refill   acetaminophen  (TYLENOL ) 500 MG tablet Take 500 mg by mouth every 6 (six) hours as needed.     azelastine (ASTELIN) 0.1 % nasal spray Place 1 spray into both nostrils 2 (two) times daily.     azithromycin  (ZITHROMAX ) 250 MG tablet 2 tabs poqday1, 1 tab poqday 2-5 6 tablet 0   Cholecalciferol (DIALYVITE  VITAMIN D  5000) 125 MCG (5000 UT) capsule Take 2 capsules (10,000 Units total) by mouth daily.     desvenlafaxine  (PRISTIQ ) 100 MG 24 hr tablet Take 1 tablet (100 mg total) by mouth daily. Take with a 25 mg tab for a total of 75 mg daily 90 tablet 0   ezetimibe  (ZETIA ) 10 MG tablet Take 1 tablet (10 mg total) by mouth daily. 90 tablet 0   fluticasone (FLONASE) 50 MCG/ACT nasal spray Place into both nostrils as needed.      hydrocortisone  2.5 % cream Apply topically 2 (two) times daily. 30 g 1   ketoconazole  (NIZORAL ) 2 % shampoo Apply 1 Application topically 2 (two) times a week. 120 mL 11   meloxicam  (MOBIC ) 15 MG tablet TAKE 1 TABLET (15 MG TOTAL) BY  MOUTH DAILY. 30 tablet 1   mometasone  (ELOCON ) 0.1 % cream Apply topically daily. 15 g 1   montelukast  (SINGULAIR ) 10 MG tablet TAKE 1 TABLET BY MOUTH EVERYDAY AT BEDTIME 90 tablet 3   Multiple Vitamins-Iron  (DAILY VITAMIN/IRON ) TABS Chelate Iron      omeprazole (PRILOSEC) 40 MG capsule Take 40 mg by mouth daily.     promethazine -dextromethorphan (PROMETHAZINE -DM) 6.25-15 MG/5ML syrup Take 5 mLs by mouth 3 (three) times daily as needed for cough. 118 mL 0   rizatriptan  (MAXALT -MLT) 5 MG disintegrating tablet Take 1 tablet (5 mg total) by mouth as needed. May repeat in 2 hours if needed 10 tablet 6   topiramate  (TOPAMAX ) 25 MG tablet Take 2 tablets (50 mg total) by mouth at bedtime. 180 tablet 3   triamcinolone  cream (KENALOG ) 0.1 % Apply 1 Application topically 2 (two) times daily. 30 g 0   VENTOLIN  HFA  108 (90 Base) MCG/ACT inhaler Inhale 2 puffs into the lungs every 4 (four) hours as needed for wheezing. 1 each 3   No current facility-administered medications on file prior to visit.   Allergies  Allergen Reactions   Penicillins Hives         Statins Other (See Comments)    MYALGIAS   Lisinopril Cough   Social History   Socioeconomic History   Marital status: Single    Spouse name: Not on file   Number of children: Not on file   Years of education: Not on file   Highest education level: Associate degree: academic program  Occupational History   Not on file  Tobacco Use   Smoking status: Never   Smokeless tobacco: Never  Vaping Use   Vaping status: Never Used  Substance and Sexual Activity   Alcohol use: No   Drug use: No   Sexual activity: Not on file  Other Topics Concern   Not on file  Social History Narrative   Left handed   Caffeine- 2 daily   Social Drivers of Health   Financial Resource Strain: Patient Declined (02/02/2024)   Overall Financial Resource Strain (CARDIA)    Difficulty of Paying Living Expenses: Patient declined  Food Insecurity: Patient Declined (02/02/2024)   Hunger Vital Sign    Worried About Running Out of Food in the Last Year: Patient declined    Ran Out of Food in the Last Year: Patient declined  Transportation Needs: Patient Declined (02/02/2024)   PRAPARE - Administrator, Civil Service (Medical): Patient declined    Lack of Transportation (Non-Medical): Patient declined  Physical Activity: Unknown (02/02/2024)   Exercise Vital Sign    Days of Exercise per Week: Patient declined    Minutes of Exercise per Session: Not on file  Stress: Patient Declined (02/02/2024)   [employer] of Occupational Health - Occupational Stress Questionnaire    Feeling of Stress: Patient declined  Social Connections: Unknown (02/02/2024)   Social Connection and Isolation Panel    Frequency of Communication with Friends and Family: Patient  declined    Frequency of Social Gatherings with Friends and Family: Patient declined    Attends Religious Services: Patient declined    Active Member of Clubs or Organizations: Patient declined    Attends Banker Meetings: Not on file    Marital Status: Patient declined  Intimate Partner Violence: Not At Risk (02/24/2023)   Received from [facility]   HITS    Over the last 12 months how often did your partner physically hurt  you?: Never    Over the last 12 months how often did your partner insult you or talk down to you?: Never    Over the last 12 months how often did your partner threaten you with physical harm?: Never    Over the last 12 months how often did your partner scream or curse at you?: Never   Family History  Problem Relation Age of Onset   Breast cancer Maternal Aunt    Colon cancer Neg Hx    Esophageal cancer Neg Hx      Review of Systems  All other systems reviewed and are negative.      Objective:   Physical Exam Vitals reviewed.  Constitutional:      General: She is not in acute distress.    Appearance: Normal appearance. She is obese. She is not ill-appearing, toxic-appearing or diaphoretic.  HENT:     Head: Normocephalic and atraumatic.     Right Ear: Tympanic membrane, ear canal and external ear normal. There is no impacted cerumen.     Left Ear: Tympanic membrane, ear canal and external ear normal. There is no impacted cerumen.     Nose: Nose normal. No congestion or rhinorrhea.     Mouth/Throat:     Mouth: Mucous membranes are moist.     Pharynx: No oropharyngeal exudate or posterior oropharyngeal erythema.  Eyes:     General: No scleral icterus.       Right eye: No discharge.        Left eye: No discharge.     Extraocular Movements: Extraocular movements intact.     Conjunctiva/sclera: Conjunctivae normal.     Pupils: Pupils are equal, round, and reactive to light.  Neck:     Vascular: No carotid bruit.  Cardiovascular:     Rate  and Rhythm: Normal rate and regular rhythm.     Pulses: Normal pulses.     Heart sounds: Normal heart sounds. No murmur heard.    No friction rub. No gallop.  Pulmonary:     Effort: Pulmonary effort is normal. No respiratory distress.     Breath sounds: Normal breath sounds. No stridor. No wheezing, rhonchi or rales.  Chest:     Chest wall: No tenderness.  Abdominal:     General: Abdomen is flat. There is no distension.     Palpations: Abdomen is soft. There is no mass.     Tenderness: There is no abdominal tenderness. There is no right CVA tenderness, left CVA tenderness, guarding or rebound.     Hernia: No hernia is present.  Musculoskeletal:        General: Normal range of motion.     Cervical back: Normal range of motion and neck supple. No rigidity or tenderness.     Right lower leg: No edema.     Left lower leg: No edema.  Lymphadenopathy:     Cervical: No cervical adenopathy.  Skin:    General: Skin is warm.     Coloration: Skin is not jaundiced or pale.     Findings: No bruising, erythema, lesion or rash.  Neurological:     General: No focal deficit present.     Mental Status: She is alert and oriented to person, place, and time. Mental status is at baseline.     Cranial Nerves: No cranial nerve deficit.     Sensory: No sensory deficit.     Motor: No weakness.     Coordination: Coordination normal.  Gait: Gait normal.     Deep Tendon Reflexes: Reflexes normal.  Psychiatric:        Mood and Affect: Mood normal.        Behavior: Behavior normal.        Thought Content: Thought content normal.        Judgment: Judgment normal.           Assessment & Plan:  General medical exam - Plan: CBC with Differential/Platelet, Comprehensive metabolic panel with GFR, Lipid panel, TSH  Other fatigue - Plan: CBC with Differential/Platelet, Comprehensive metabolic panel with GFR, Lipid panel, TSH  Benign essential HTN - Plan: CBC with Differential/Platelet, Comprehensive  metabolic panel with GFR, Lipid panel, TSH  Encounter for screening mammogram for malignant neoplasm of breast - Plan: MM Digital Screening  Postmenopausal estrogen deficiency - Plan: DG Bone Density  Because she is cold all the time and tired I plan to check a CBC as well as a TSH.  I will also check a CMP and lipid panel.  Her blood pressure today is well-controlled.  It sounds like her blood pressure is even better at home.  She is currently only taking 80 mg of valsartan .  She request a refill on that prescription.  I will check a fasting lipid panel.  Ideally would like her LDL cholesterol to be less than 100.  I will schedule the patient for a mammogram.  Her colonoscopy is already scheduled and pending.  She is due for a bone density and I will schedule this for her.  She received her flu shot today.  She defers the pneumonia vaccine to a later date.

## 2024-06-05 ENCOUNTER — Ambulatory Visit: Payer: Self-pay | Admitting: Family Medicine

## 2024-06-07 DIAGNOSIS — K912 Postsurgical malabsorption, not elsewhere classified: Secondary | ICD-10-CM | POA: Diagnosis not present

## 2024-06-07 DIAGNOSIS — Z9884 Bariatric surgery status: Secondary | ICD-10-CM | POA: Diagnosis not present

## 2024-06-08 DIAGNOSIS — Z79899 Other long term (current) drug therapy: Secondary | ICD-10-CM | POA: Diagnosis not present

## 2024-06-08 DIAGNOSIS — Z9884 Bariatric surgery status: Secondary | ICD-10-CM | POA: Diagnosis not present

## 2024-06-08 DIAGNOSIS — E538 Deficiency of other specified B group vitamins: Secondary | ICD-10-CM | POA: Diagnosis not present

## 2024-06-08 DIAGNOSIS — E46 Unspecified protein-calorie malnutrition: Secondary | ICD-10-CM | POA: Diagnosis not present

## 2024-07-04 ENCOUNTER — Encounter: Admitting: Internal Medicine

## 2024-07-23 ENCOUNTER — Encounter: Admitting: Internal Medicine

## 2024-08-09 ENCOUNTER — Telehealth (HOSPITAL_COMMUNITY): Admitting: Psychiatry

## 2024-08-14 ENCOUNTER — Encounter: Admitting: Internal Medicine

## 2024-08-20 ENCOUNTER — Ambulatory Visit

## 2024-08-30 ENCOUNTER — Ambulatory Visit

## 2024-10-02 ENCOUNTER — Ambulatory Visit: Admitting: Family Medicine
